# Patient Record
Sex: Male | Born: 1950 | Race: White | Hispanic: No | Marital: Married | Smoking: Former smoker
Health system: Southern US, Community
[De-identification: ages and names within clinical notes are randomized; demographics above are authoritative.]

## PROBLEM LIST (undated history)

## (undated) DIAGNOSIS — I2510 Atherosclerotic heart disease of native coronary artery without angina pectoris: Secondary | ICD-10-CM

## (undated) DIAGNOSIS — J84112 Idiopathic pulmonary fibrosis: Secondary | ICD-10-CM

## (undated) DIAGNOSIS — J849 Interstitial pulmonary disease, unspecified: Secondary | ICD-10-CM

## (undated) DIAGNOSIS — N400 Enlarged prostate without lower urinary tract symptoms: Secondary | ICD-10-CM

## (undated) HISTORY — DX: Benign prostatic hyperplasia without lower urinary tract symptoms: N40.0

## (undated) HISTORY — DX: Interstitial pulmonary disease, unspecified: J84.9

## (undated) HISTORY — DX: Atherosclerotic heart disease of native coronary artery without angina pectoris: I25.10

---

## 1976-09-23 HISTORY — PX: KNEE SURGERY: SHX244

## 1990-09-23 HISTORY — PX: ANGIOPLASTY: SHX39

## 2001-01-21 HISTORY — PX: CORONARY ANGIOPLASTY WITH STENT PLACEMENT: SHX49

## 2001-01-21 HISTORY — PX: ESOPHAGOGASTRODUODENOSCOPY: SHX1529

## 2001-01-26 ENCOUNTER — Inpatient Hospital Stay (HOSPITAL_COMMUNITY): Admission: AD | Admit: 2001-01-26 | Discharge: 2001-01-30 | Payer: Self-pay | Admitting: Cardiology

## 2001-01-28 ENCOUNTER — Encounter: Payer: Self-pay | Admitting: Cardiology

## 2004-07-02 LAB — FECAL OCCULT BLOOD, GUAIAC: Fecal Occult Blood: NEGATIVE

## 2004-11-08 ENCOUNTER — Ambulatory Visit: Payer: Self-pay | Admitting: Internal Medicine

## 2005-01-07 ENCOUNTER — Ambulatory Visit: Payer: Self-pay | Admitting: Cardiology

## 2005-01-16 ENCOUNTER — Ambulatory Visit: Payer: Self-pay

## 2005-05-08 ENCOUNTER — Ambulatory Visit: Payer: Self-pay | Admitting: Internal Medicine

## 2005-07-04 ENCOUNTER — Ambulatory Visit: Payer: Self-pay | Admitting: Internal Medicine

## 2005-07-10 ENCOUNTER — Ambulatory Visit: Payer: Self-pay | Admitting: Internal Medicine

## 2005-08-21 ENCOUNTER — Ambulatory Visit: Payer: Self-pay | Admitting: Internal Medicine

## 2005-10-01 ENCOUNTER — Ambulatory Visit: Payer: Self-pay | Admitting: Internal Medicine

## 2005-11-08 ENCOUNTER — Ambulatory Visit: Payer: Self-pay | Admitting: Internal Medicine

## 2005-12-04 ENCOUNTER — Ambulatory Visit: Payer: Self-pay | Admitting: Internal Medicine

## 2006-01-08 ENCOUNTER — Ambulatory Visit: Payer: Self-pay | Admitting: Cardiology

## 2006-01-30 ENCOUNTER — Ambulatory Visit: Payer: Self-pay | Admitting: Internal Medicine

## 2006-05-03 ENCOUNTER — Ambulatory Visit: Payer: Self-pay | Admitting: Internal Medicine

## 2006-05-09 ENCOUNTER — Ambulatory Visit: Payer: Self-pay | Admitting: Internal Medicine

## 2006-05-28 ENCOUNTER — Ambulatory Visit: Payer: Self-pay | Admitting: Gastroenterology

## 2006-09-16 ENCOUNTER — Other Ambulatory Visit: Payer: Self-pay

## 2006-09-16 ENCOUNTER — Emergency Department: Payer: Self-pay | Admitting: Emergency Medicine

## 2006-11-17 ENCOUNTER — Ambulatory Visit: Payer: Self-pay | Admitting: Internal Medicine

## 2006-11-17 LAB — CONVERTED CEMR LAB
ALT: 18 units/L (ref 0–40)
Cholesterol: 136 mg/dL (ref 0–200)
HDL: 42 mg/dL (ref >39.0)
LDL Cholesterol: 81 mg/dL (ref 0–99)
Total CHOL/HDL Ratio: 3.2
Triglycerides: 67 mg/dL (ref 0–149)
VLDL: 13 mg/dL (ref 0–40)

## 2006-12-23 ENCOUNTER — Ambulatory Visit: Payer: Self-pay | Admitting: Cardiology

## 2006-12-31 ENCOUNTER — Ambulatory Visit: Payer: Self-pay

## 2007-05-07 DIAGNOSIS — I2510 Atherosclerotic heart disease of native coronary artery without angina pectoris: Secondary | ICD-10-CM

## 2007-05-07 DIAGNOSIS — E785 Hyperlipidemia, unspecified: Secondary | ICD-10-CM

## 2007-05-18 ENCOUNTER — Ambulatory Visit: Payer: Self-pay | Admitting: Internal Medicine

## 2007-05-19 LAB — CONVERTED CEMR LAB
ALT: 18 units/L (ref 0–53)
Albumin: 3.8 g/dL (ref 3.5–5.2)
BUN: 10 mg/dL (ref 6–23)
CO2: 32 meq/L (ref 19–32)
Calcium: 9.1 mg/dL (ref 8.4–10.5)
Chloride: 107 meq/L (ref 96–112)
Cholesterol: 146 mg/dL (ref 0–200)
Creatinine, Ser: 0.9 mg/dL (ref 0.4–1.5)
GFR calc Af Amer: 112 mL/min
GFR calc non Af Amer: 93 mL/min
Glucose, Bld: 98 mg/dL (ref 70–99)
HDL: 38.3 mg/dL — ABNORMAL LOW (ref >39.0)
LDL Cholesterol: 91 mg/dL (ref 0–99)
Phosphorus: 2.5 mg/dL (ref 2.3–4.6)
Potassium: 4.8 meq/L (ref 3.5–5.1)
Sodium: 142 meq/L (ref 135–145)
Total CHOL/HDL Ratio: 3.8
Triglycerides: 86 mg/dL (ref 0–149)
VLDL: 17 mg/dL (ref 0–40)

## 2007-10-07 ENCOUNTER — Encounter: Payer: Self-pay | Admitting: Internal Medicine

## 2007-11-06 ENCOUNTER — Ambulatory Visit: Payer: Self-pay | Admitting: Internal Medicine

## 2007-11-09 LAB — CONVERTED CEMR LAB
ALT: 21 units/L (ref 0–53)
AST: 22 units/L (ref 0–37)
Albumin: 4.2 g/dL (ref 3.5–5.2)
Alkaline Phosphatase: 32 units/L — ABNORMAL LOW (ref 39–117)
BUN: 10 mg/dL (ref 6–23)
Basophils Absolute: 0 10*3/uL (ref 0.0–0.1)
Basophils Relative: 0.2 % (ref 0.0–1.0)
Bilirubin, Direct: 0.1 mg/dL (ref 0.0–0.3)
CO2: 31 meq/L (ref 19–32)
Calcium: 9.7 mg/dL (ref 8.4–10.5)
Chloride: 108 meq/L (ref 96–112)
Cholesterol: 147 mg/dL (ref 0–200)
Creatinine, Ser: 0.9 mg/dL (ref 0.4–1.5)
Eosinophils Absolute: 0.2 10*3/uL (ref 0.0–0.6)
Eosinophils Relative: 2.8 % (ref 0.0–5.0)
GFR calc Af Amer: 112 mL/min
GFR calc non Af Amer: 93 mL/min
Glucose, Bld: 94 mg/dL (ref 70–99)
HCT: 44 % (ref 39.0–52.0)
HDL: 40.3 mg/dL (ref >39.0)
Hemoglobin: 14.7 g/dL (ref 13.0–17.0)
LDL Cholesterol: 85 mg/dL (ref 0–99)
Lymphocytes Relative: 16.2 % (ref 12.0–46.0)
MCHC: 33.3 g/dL (ref 30.0–36.0)
MCV: 92.5 fL (ref 78.0–100.0)
Monocytes Absolute: 0.6 10*3/uL (ref 0.2–0.7)
Monocytes Relative: 8.8 % (ref 3.0–11.0)
Neutro Abs: 4.6 10*3/uL (ref 1.4–7.7)
Neutrophils Relative %: 72 % (ref 43.0–77.0)
Phosphorus: 3.3 mg/dL (ref 2.3–4.6)
Platelets: 182 10*3/uL (ref 150–400)
Potassium: 4.8 meq/L (ref 3.5–5.1)
RBC: 4.76 M/uL (ref 4.22–5.81)
RDW: 11.9 % (ref 11.5–14.6)
Sodium: 142 meq/L (ref 135–145)
TSH: 0.75 microintl units/mL (ref 0.35–5.50)
Total Bilirubin: 0.9 mg/dL (ref 0.3–1.2)
Total CHOL/HDL Ratio: 3.6
Total Protein: 7.7 g/dL (ref 6.0–8.3)
Triglycerides: 109 mg/dL (ref 0–149)
VLDL: 22 mg/dL (ref 0–40)
WBC: 6.4 10*3/uL (ref 4.5–10.5)

## 2007-11-27 ENCOUNTER — Ambulatory Visit: Payer: Self-pay | Admitting: Internal Medicine

## 2007-12-01 ENCOUNTER — Encounter (INDEPENDENT_AMBULATORY_CARE_PROVIDER_SITE_OTHER): Payer: Self-pay | Admitting: *Deleted

## 2007-12-23 ENCOUNTER — Ambulatory Visit: Payer: Self-pay | Admitting: Cardiology

## 2007-12-30 ENCOUNTER — Ambulatory Visit: Payer: Self-pay | Admitting: Internal Medicine

## 2008-01-04 ENCOUNTER — Encounter: Payer: Self-pay | Admitting: Internal Medicine

## 2008-01-13 ENCOUNTER — Ambulatory Visit: Payer: Self-pay | Admitting: Pulmonary Disease

## 2008-01-13 DIAGNOSIS — J84112 Idiopathic pulmonary fibrosis: Secondary | ICD-10-CM | POA: Insufficient documentation

## 2008-01-14 ENCOUNTER — Ambulatory Visit: Payer: Self-pay | Admitting: Internal Medicine

## 2008-01-22 ENCOUNTER — Ambulatory Visit: Payer: Self-pay | Admitting: Pulmonary Disease

## 2008-05-10 ENCOUNTER — Ambulatory Visit: Payer: Self-pay | Admitting: Internal Medicine

## 2008-05-10 DIAGNOSIS — J310 Chronic rhinitis: Secondary | ICD-10-CM

## 2008-05-11 LAB — CONVERTED CEMR LAB
ALT: 18 units/L (ref 0–53)
AST: 20 units/L (ref 0–37)
Albumin: 4 g/dL (ref 3.5–5.2)
Alkaline Phosphatase: 28 units/L — ABNORMAL LOW (ref 39–117)
BUN: 15 mg/dL (ref 6–23)
Basophils Absolute: 0 10*3/uL (ref 0.0–0.1)
Basophils Relative: 0.1 % (ref 0.0–3.0)
Bilirubin, Direct: 0.2 mg/dL (ref 0.0–0.3)
CO2: 29 meq/L (ref 19–32)
Calcium: 9.2 mg/dL (ref 8.4–10.5)
Chloride: 105 meq/L (ref 96–112)
Cholesterol: 153 mg/dL (ref 0–200)
Creatinine, Ser: 1.1 mg/dL (ref 0.4–1.5)
Eosinophils Absolute: 0.2 10*3/uL (ref 0.0–0.7)
Eosinophils Relative: 3 % (ref 0.0–5.0)
GFR calc Af Amer: 89 mL/min
GFR calc non Af Amer: 73 mL/min
Glucose, Bld: 98 mg/dL (ref 70–99)
HCT: 42.2 % (ref 39.0–52.0)
HDL: 41.9 mg/dL (ref >39.0)
Hemoglobin: 14.9 g/dL (ref 13.0–17.0)
LDL Cholesterol: 91 mg/dL (ref 0–99)
Lymphocytes Relative: 23.5 % (ref 12.0–46.0)
MCHC: 35.2 g/dL (ref 30.0–36.0)
MCV: 93.2 fL (ref 78.0–100.0)
Monocytes Absolute: 0.6 10*3/uL (ref 0.1–1.0)
Monocytes Relative: 8.9 % (ref 3.0–12.0)
Neutro Abs: 4.2 10*3/uL (ref 1.4–7.7)
Neutrophils Relative %: 64.5 % (ref 43.0–77.0)
Phosphorus: 3.3 mg/dL (ref 2.3–4.6)
Platelets: 170 10*3/uL (ref 150–400)
Potassium: 4.8 meq/L (ref 3.5–5.1)
RBC: 4.53 M/uL (ref 4.22–5.81)
RDW: 12.2 % (ref 11.5–14.6)
Sodium: 139 meq/L (ref 135–145)
TSH: 0.82 microintl units/mL (ref 0.35–5.50)
Total Bilirubin: 1.1 mg/dL (ref 0.3–1.2)
Total CHOL/HDL Ratio: 3.7
Total Protein: 7.2 g/dL (ref 6.0–8.3)
Triglycerides: 100 mg/dL (ref 0–149)
VLDL: 20 mg/dL (ref 0–40)
WBC: 6.6 10*3/uL (ref 4.5–10.5)

## 2008-07-20 ENCOUNTER — Ambulatory Visit: Payer: Self-pay | Admitting: Pulmonary Disease

## 2008-10-03 ENCOUNTER — Encounter: Payer: Self-pay | Admitting: Internal Medicine

## 2008-11-15 ENCOUNTER — Ambulatory Visit: Payer: Self-pay | Admitting: Internal Medicine

## 2008-11-16 LAB — CONVERTED CEMR LAB
ALT: 18 units/L (ref 0–53)
AST: 24 units/L (ref 0–37)
Albumin: 3.9 g/dL (ref 3.5–5.2)
Alkaline Phosphatase: 32 units/L — ABNORMAL LOW (ref 39–117)
Basophils Absolute: 0 10*3/uL (ref 0.0–0.1)
Basophils Relative: 0.1 % (ref 0.0–3.0)
CO2: 30 meq/L (ref 19–32)
Calcium: 9.5 mg/dL (ref 8.4–10.5)
Cholesterol: 149 mg/dL (ref 0–200)
Creatinine, Ser: 1 mg/dL (ref 0.4–1.5)
Glucose, Bld: 99 mg/dL (ref 70–99)
Hemoglobin: 14.7 g/dL (ref 13.0–17.0)
LDL Cholesterol: 90 mg/dL (ref 0–99)
Lymphocytes Relative: 17.3 % (ref 12.0–46.0)
MCHC: 34.2 g/dL (ref 30.0–36.0)
Monocytes Relative: 8.3 % (ref 3.0–12.0)
Neutro Abs: 4.5 10*3/uL (ref 1.4–7.7)
Neutrophils Relative %: 71.6 % (ref 43.0–77.0)
Phosphorus: 2.8 mg/dL (ref 2.3–4.6)
RBC: 4.61 M/uL (ref 4.22–5.81)
Total Bilirubin: 1 mg/dL (ref 0.3–1.2)
Total CHOL/HDL Ratio: 3.9
VLDL: 21 mg/dL (ref 0–40)

## 2008-11-22 ENCOUNTER — Ambulatory Visit: Payer: Self-pay | Admitting: Internal Medicine

## 2008-11-22 DIAGNOSIS — K921 Melena: Secondary | ICD-10-CM

## 2008-12-21 ENCOUNTER — Ambulatory Visit: Payer: Self-pay | Admitting: Gastroenterology

## 2009-01-17 ENCOUNTER — Encounter: Payer: Self-pay | Admitting: Pulmonary Disease

## 2009-01-18 ENCOUNTER — Ambulatory Visit: Payer: Self-pay | Admitting: Pulmonary Disease

## 2009-01-21 HISTORY — PX: URETERAL STENT PLACEMENT: SHX822

## 2009-02-10 ENCOUNTER — Encounter: Payer: Self-pay | Admitting: Internal Medicine

## 2009-02-10 LAB — HM COLONOSCOPY

## 2009-02-21 ENCOUNTER — Ambulatory Visit: Payer: Self-pay | Admitting: Cardiology

## 2009-03-10 ENCOUNTER — Ambulatory Visit: Payer: Self-pay | Admitting: Urology

## 2009-03-10 ENCOUNTER — Encounter: Payer: Self-pay | Admitting: Internal Medicine

## 2009-03-10 ENCOUNTER — Emergency Department: Payer: Self-pay | Admitting: Emergency Medicine

## 2009-03-13 ENCOUNTER — Ambulatory Visit: Payer: Self-pay | Admitting: Urology

## 2009-03-16 ENCOUNTER — Ambulatory Visit: Payer: Self-pay | Admitting: Urology

## 2009-03-23 ENCOUNTER — Ambulatory Visit: Payer: Self-pay | Admitting: Urology

## 2009-03-23 ENCOUNTER — Encounter: Payer: Self-pay | Admitting: Internal Medicine

## 2009-04-03 ENCOUNTER — Ambulatory Visit: Payer: Self-pay | Admitting: Urology

## 2009-04-04 ENCOUNTER — Telehealth (INDEPENDENT_AMBULATORY_CARE_PROVIDER_SITE_OTHER): Payer: Self-pay | Admitting: *Deleted

## 2009-04-05 ENCOUNTER — Telehealth: Payer: Self-pay | Admitting: Cardiology

## 2009-04-06 ENCOUNTER — Telehealth: Payer: Self-pay | Admitting: Cardiology

## 2009-06-07 ENCOUNTER — Ambulatory Visit: Payer: Self-pay | Admitting: Internal Medicine

## 2009-06-08 LAB — CONVERTED CEMR LAB
Albumin: 4 g/dL (ref 3.5–5.2)
Basophils Relative: 0.2 % (ref 0.0–3.0)
CO2: 31 meq/L (ref 19–32)
Chloride: 104 meq/L (ref 96–112)
Eosinophils Absolute: 0.1 10*3/uL (ref 0.0–0.7)
HCT: 43.8 % (ref 39.0–52.0)
HDL: 45.2 mg/dL (ref >39.00)
Hemoglobin: 14.7 g/dL (ref 13.0–17.0)
LDL Cholesterol: 97 mg/dL (ref 0–99)
Lymphocytes Relative: 17.3 % (ref 12.0–46.0)
Lymphs Abs: 1 10*3/uL (ref 0.7–4.0)
MCHC: 33.6 g/dL (ref 30.0–36.0)
MCV: 93.7 fL (ref 78.0–100.0)
Neutro Abs: 4.2 10*3/uL (ref 1.4–7.7)
Potassium: 5.1 meq/L (ref 3.5–5.1)
RBC: 4.68 M/uL (ref 4.22–5.81)
Sodium: 140 meq/L (ref 135–145)
Total Bilirubin: 1.2 mg/dL (ref 0.3–1.2)
Total CHOL/HDL Ratio: 3
Triglycerides: 77 mg/dL (ref 0.0–149.0)

## 2009-07-03 ENCOUNTER — Ambulatory Visit: Payer: Self-pay | Admitting: Urology

## 2009-07-03 ENCOUNTER — Encounter: Payer: Self-pay | Admitting: Internal Medicine

## 2009-07-10 ENCOUNTER — Ambulatory Visit: Payer: Self-pay | Admitting: Urology

## 2009-07-11 ENCOUNTER — Telehealth: Payer: Self-pay | Admitting: Internal Medicine

## 2009-08-23 ENCOUNTER — Encounter: Payer: Self-pay | Admitting: Internal Medicine

## 2009-11-21 ENCOUNTER — Ambulatory Visit: Payer: Self-pay | Admitting: Internal Medicine

## 2009-11-21 DIAGNOSIS — L570 Actinic keratosis: Secondary | ICD-10-CM | POA: Insufficient documentation

## 2009-11-22 ENCOUNTER — Encounter: Payer: Self-pay | Admitting: Internal Medicine

## 2009-11-22 LAB — CONVERTED CEMR LAB
AST: 21 units/L (ref 0–37)
Basophils Relative: 0.2 % (ref 0.0–3.0)
CO2: 31 meq/L (ref 19–32)
Calcium: 9.5 mg/dL (ref 8.4–10.5)
Eosinophils Absolute: 0.2 10*3/uL (ref 0.0–0.7)
Glucose, Bld: 98 mg/dL (ref 70–99)
HDL: 51.9 mg/dL (ref >39.00)
Hemoglobin: 14.4 g/dL (ref 13.0–17.0)
Lymphs Abs: 1.1 10*3/uL (ref 0.7–4.0)
MCHC: 32.7 g/dL (ref 30.0–36.0)
MCV: 95.9 fL (ref 78.0–100.0)
Monocytes Absolute: 0.5 10*3/uL (ref 0.1–1.0)
Neutro Abs: 4 10*3/uL (ref 1.4–7.7)
Potassium: 4.9 meq/L (ref 3.5–5.1)
RBC: 4.58 M/uL (ref 4.22–5.81)
Sodium: 140 meq/L (ref 135–145)
Total Bilirubin: 0.9 mg/dL (ref 0.3–1.2)
Total CHOL/HDL Ratio: 3

## 2010-01-15 ENCOUNTER — Ambulatory Visit: Payer: Self-pay | Admitting: Pulmonary Disease

## 2010-01-17 ENCOUNTER — Encounter: Payer: Self-pay | Admitting: Pulmonary Disease

## 2010-01-17 ENCOUNTER — Ambulatory Visit (HOSPITAL_COMMUNITY): Admission: RE | Admit: 2010-01-17 | Discharge: 2010-01-17 | Payer: Self-pay | Admitting: Pulmonary Disease

## 2010-01-24 ENCOUNTER — Encounter: Payer: Self-pay | Admitting: Internal Medicine

## 2010-01-24 ENCOUNTER — Ambulatory Visit: Payer: Self-pay | Admitting: Urology

## 2010-02-05 ENCOUNTER — Telehealth: Payer: Self-pay | Admitting: Pulmonary Disease

## 2010-02-09 ENCOUNTER — Ambulatory Visit: Payer: Self-pay | Admitting: Internal Medicine

## 2010-02-22 ENCOUNTER — Ambulatory Visit: Payer: Self-pay | Admitting: Cardiology

## 2010-02-27 ENCOUNTER — Telehealth (INDEPENDENT_AMBULATORY_CARE_PROVIDER_SITE_OTHER): Payer: Self-pay | Admitting: *Deleted

## 2010-02-28 ENCOUNTER — Ambulatory Visit: Payer: Self-pay | Admitting: Cardiovascular Disease

## 2010-02-28 ENCOUNTER — Encounter: Payer: Self-pay | Admitting: Cardiovascular Disease

## 2010-02-28 ENCOUNTER — Ambulatory Visit: Payer: Self-pay

## 2010-02-28 ENCOUNTER — Encounter (HOSPITAL_COMMUNITY): Admission: RE | Admit: 2010-02-28 | Discharge: 2010-04-24 | Payer: Self-pay | Admitting: Cardiology

## 2010-06-06 ENCOUNTER — Ambulatory Visit: Payer: Self-pay | Admitting: Pulmonary Disease

## 2010-08-03 ENCOUNTER — Ambulatory Visit: Payer: Self-pay | Admitting: Pulmonary Disease

## 2010-08-08 ENCOUNTER — Ambulatory Visit: Payer: Self-pay | Admitting: Cardiovascular Disease

## 2010-08-08 DIAGNOSIS — R0609 Other forms of dyspnea: Secondary | ICD-10-CM

## 2010-08-08 DIAGNOSIS — R0989 Other specified symptoms and signs involving the circulatory and respiratory systems: Secondary | ICD-10-CM

## 2010-08-09 ENCOUNTER — Telehealth: Payer: Self-pay | Admitting: Pulmonary Disease

## 2010-10-23 NOTE — Assessment & Plan Note (Signed)
Summary: rov for isld   Copy to:  [name], MD Primary Provider/Referring Provider:  [name], MD  CC:  Pt is here for a 1 yr f/u appt.  Pt c/o increased sob with exertion.  Pt also c/o nonproductive cough.  Pt denied tightness in chest or sore throat.  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  We have been following this, with no pft or radiographic changes, and the pt has been essentially asymptomatic.  He comes in today where he is doing well, with no significant change in his exertional tolerance.  He is having more allergy symptoms with cough from postnasal drip, but has no exercise limitation.  I questioned him carefully about worsening doe, and he really does not have this.  He denies chest congestion or purulence.  Current Medications (verified): 1)  Aspirin 81 Mg  Tbec (Aspirin) .... Take One By Mouth Once A Day 2)  Crestor 40 Mg  Tabs (Rosuvastatin Calcium) .... Take One By Mouth Once A Day 3)  Metoprolol Succinate 25 Mg  Tb24 (Metoprolol Succinate) .... Take One By Mouth Once A Day 4)  Flonase 50 Mcg/act  Susp (Fluticasone Propionate) .... 2 Sprays Each  Nostril Daily Two Times A Day As Needed 5)  Nitroglycerin 0.4 Mg Subl (Nitroglycerin) .... One Tablet Under Tongue Every 5 Minutes As Needed For Chest Pain---May Repeat Times Three 6)  Rapaflo 8 Mg Caps (Silodosin) .... Take 1 By Mouth Once Daily 7)  Diovan 160 Mg Tabs (Valsartan) .... 1/2 Tab By Mouth Daily  Allergies (verified): 1)  Lipitor (Atorvastatin Calcium) 2)  Doxycycline  Review of Systems      See HPI  Vital Signs:  Patient profile:   60 year old male Height:      72 inches Weight:      191.13 pounds BMI:     26.02 O2 Sat:      97 % on Room air Temp:     97.7 degrees F oral Pulse rate:   67 / minute BP sitting:   126 / 70  (left arm)  Vitals Entered By: Arman Filter LPN (January 15, 2010 8:50 AM)  O2 Flow:  Room air CC: Pt is here for a  1 yr f/u appt.  Pt c/o increased sob with exertion.  Pt also c/o nonproductive cough.  Pt denied tightness in chest or sore throat.   Comments Medications reviewed with patient Arman Filter LPN  January 15, 2010 8:50 AM    Physical Exam  General:  thin male in nad Lungs:  crackles 1/3 up r>l, no wheezing or rhonchi Heart:  rrr, no mrg Extremities:  no edema noted, no cyanosis Neurologic:  alert and oriented, moves all 4.   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  HIs cxr is stable from last year as well.  He needs full pfts to compare to last year, and if stable, would continue to monitor him for change.  If pfts show worsening, would repeat ct chest and consider vats biopsy.  I have discussed this with pt at great length, and answered all of his questions.  Medications Added to Medication List This Visit: 1)  Flonase 50 Mcg/act Susp (Fluticasone propionate) .... 2 sprays each  nostril daily two times a day as  needed  Other Orders: T-2 View CXR (71020TC) Est. Patient Level III (16109) Pulmonary Referral (Pulmonary)  Patient Instructions: 1)  will schedule for pfts to evaluate your lung function and compare to last year. 2)  please call if changes in symptoms 3)  followup with me in one year.  Appended Document: rov for isld received PFTS from [facility] and put in your very important look at folder  Appended Document: rov for isld will review and discuss with pt.  Appended Document: rov for isld megan, let pt know that his pfts do show some decline in his lung function.  I would like to do ct chest to compare to 2009, and will discuss with him in detail once all of this is done.  thanks. will send an order to pcc when you let me know you have spoken to him.  Appended Document: rov for isld [id number]  Appended Document: rov for  isld Pt aware of pft results.  Ok to send order to pcc for ct chest scheduling.  Message sent to Dr [name].

## 2010-10-23 NOTE — Assessment & Plan Note (Signed)
Summary: Cardiology Nuclear Study  Nuclear Med Background Indications for Stress Test: Evaluation for Ischemia   History: Heart Catheterization, Myocardial Infarction, Myocardial Perfusion Study, Stents  History Comments: '02 IPWMI > Heart Cath: EF=55%, totalled RCA, 75% CFX > Stent: RCA 4/08 MPS: EF=51%, old infero-lateral scar, (-) ischemia  Symptoms: DOE, Palpitations, SOB    Nuclear Pre-Procedure Cardiac Risk Factors: Family History - CAD, History of Smoking, Hypertension, Lipids Caffeine/Decaff Intake: None NPO After: 9:00 PM Lungs: clear IV 0.9% NS with Angio Cath: 20g     IV Site: (R) AC IV Started by: Irean Hong RN Chest Size (in) 41     Height (in): 72 Weight (lb): 188 BMI: 25.59 Tech Comments: Metoprolol 24 hrs.ago  Nuclear Med Study 1 or 2 day study:  1 day     Stress Test Type:  Stress Reading MD:  [name], MD     Referring MD:  T.Wall Resting Radionuclide:  Technetium 5m Tetrofosmin     Resting Radionuclide Dose:  11 mCi  Stress Radionuclide:  Technetium 8m Tetrofosmin     Stress Radionuclide Dose:  33 mCi   Stress Protocol Exercise Time (min):  10:00 min     Max HR:  171 bpm     Predicted Max HR:  161 bpm  Max Systolic BP: 159 mm Hg     Percent Max HR:  106.21 %     METS: 11.70 Rate Pressure Product:  16109    Stress Test Technologist:  Milana Na EMT-P     Nuclear Technologist:  Domenic Polite CNMT  Rest Procedure  Myocardial perfusion imaging was performed at rest 45 minutes following the intravenous administration of Myoview Technetium 72m Tetrofosmin.  Stress Procedure  The patient exercised for 10:00. The patient stopped due to fatigue and denied any chest pain.  There were no significant ST-T wave changes.  Myoview was injected at peak exercise and myocardial perfusion imaging was performed after a brief delay.  QPS Raw Data Images:  Normal; no motion artifact; normal heart/lung ratio. Stress Images:  ILMI Rest Images:   ILMI Subtraction (SDS):  SDS 5 Transient Ischemic Dilatation:  1.01  (Normal <1.22)  Lung/Heart Ratio:  .40  (Normal <0.45)  Quantitative Gated Spect Images QGS EDV:  99 ml QGS ESV:  41 ml QGS EF:  59 % QGS cine images:  normal  Findings Low risk nuclear study Evidence for inferior ischemia  Evidence for inferior infarct     Overall Impression  Exercise Capacity: Good exercise capacity. BP Response: Normal blood pressure response. 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 no change in treatment.  Appended Document: Cardiology Nuclear Study LMTCB./CY  Appended Document: Cardiology Nuclear Study PT AWARE./CY

## 2010-10-23 NOTE — Progress Notes (Signed)
Summary: return call  Phone Note Call from Patient Call back at [phone]   Caller: Patient Call For: clance Reason for Call: Talk to Nurse Summary of Call: pt returning call to [facility] Initial call taken by: Eugene Gavia,  Feb 05, 2010 8:17 AM  Follow-up for Phone Call        Pt aware of pft results.  Ok to send order to pcc for ct chest scheduling. Abigail Miyamoto RN  Feb 05, 2010 8:53 AM   Additional Follow-up for Phone Call Additional follow up Details #1::        done. Additional Follow-up by: Barbaraann Share MD,  Feb 05, 2010 5:15 PM

## 2010-10-23 NOTE — Procedures (Signed)
Summary: Colonoscopy by Dr.Steven [facility]  Colonoscopy by Dr.Steven [facility]   Imported By: Beau Fanny 11/22/2009 10:52:57  _____________________________________________________________________  External Attachment:    Type:   Image     Comment:   External Document

## 2010-10-23 NOTE — Assessment & Plan Note (Signed)
Summary: rov for isld   Visit Type:  Follow-up Primary Torah Pinnock/Referring Mitsue Peery:  [name], MD  CC:  ILD  3 month follow-up...still SOB with exertion but no worse...PND.  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  We have been following chest ct's and pfts, with no significant change noted.  He also has not had a change clinically either.  He comes in today where he is having some cough, but believes this is due to allergies with postnasal drip.  He has some doe with primarily heavy activities, but has maintained his usual baseline.  He is exercising regularly, and has not seen a decline in function.  Current Medications (verified): 1)  Aspirin 81 Mg  Tbec (Aspirin) .... Take One By Mouth Once A Day 2)  Crestor 40 Mg  Tabs (Rosuvastatin Calcium) .... Take One By Mouth Once A Day 3)  Metoprolol Succinate 25 Mg  Tb24 (Metoprolol Succinate) .... Take One By Mouth Once A Day 4)  Flonase 50 Mcg/act  Susp (Fluticasone Propionate) .... 2 Sprays Each  Nostril Daily Two Times A Day As Needed 5)  Nitroglycerin 0.4 Mg Subl (Nitroglycerin) .... One Tablet Under Tongue Every 5 Minutes As Needed For Chest Pain---May Repeat Times Three 6)  Diovan 160 Mg Tabs (Valsartan) .... 1/2 Tab By Mouth Daily  Allergies (verified): 1)  Lipitor (Atorvastatin Calcium) 2)  Doxycycline  Review of Systems       The patient complains of shortness of breath with activity and non-productive cough.  The patient denies shortness of breath at rest, productive cough, coughing up blood, chest pain, irregular heartbeats, acid heartburn, indigestion, loss of appetite, weight change, abdominal pain, difficulty swallowing, sore throat, tooth/dental problems, headaches, nasal congestion/difficulty breathing through nose, sneezing, itching, ear ache, anxiety, depression, hand/feet swelling, joint stiffness or pain, rash, change in color of mucus, and fever.    Vital  Signs:  Patient profile:   60 year old male Height:      72 inches (182.88 cm) Weight:      189 pounds (85.91 kg) BMI:     25.73 O2 Sat:      97 % on Room air Temp:     97.9 degrees F (36.61 degrees C) oral Pulse rate:   64 / minute BP sitting:   104 / 62  (left arm) Cuff size:   regular  Vitals Entered By: Michel Bickers CMA (June 06, 2010 9:24 AM)  O2 Sat at Rest %:  97 O2 Flow:  Room air CC: ILD  3 month follow-up...still SOB with exertion but no worse...PND Comments Medications reviewed with patient Daytime phone verified. Michel Bickers CMA  June 06, 2010 9:24 AM   Physical Exam  General:  wd male in nad Nose:  no obvious purulence or discharge noted Lungs:  basilar crackles, no wheezing Heart:  rrr, no mrg Extremities:  no edema or cyanosis  Neurologic:  alert and oriented, moves all 4.   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  He has not seen a decrease in his exercise tolerance, and really has no pulmonary symptoms.  He is describing a mild cough that he and I both feel is related to postnasal drip.  His pfts and chest ct have been stable.  He is due for f/u testing next visit in  6mos.  He understands that if he has any subjective or objective worsening, he will need a lung biopsy to establish a firm diagnosis, and to consider treatment options.  Other Orders: Est. Patient Level III (16109) Pulmonary Referral (Pulmonary)  Patient Instructions: 1)  will see you back in 6mos with pfts and cxr same day 2)  continue on flonase, add chlorpheniramine 8mg  each night for a few weeks to see if helps postnasal drip and cough.

## 2010-10-23 NOTE — Assessment & Plan Note (Signed)
Summary: acute sick visit for worsening sob.   Copy to:  Bradley Abide, MD Primary Provider/Referring Provider:  [name], MD  CC:  pt states his sob has gotten worse. Pt states he feels like sometimes he has to gasp for air when doing activity. Pt c/o cough with occas clear phlem. pt quit smoking 1992. Bradley May  History of Present Illness: the pt comes in today for an acute sick visit.  He has known ISLD that is in a pattern most c/w UIP.  We have been following him with serial chest radiographs and pfts, and he has been fairly stable.  He comes in today where he feels that his breathing has gotten worse most recently.  He has seen more sob with moderate levels of activity compared to the past.  He has a mild cough with nonpurulent mucus.  He denies any chest pain or LE edema.  Current Medications (verified): 1)  Aspirin 81 Mg  Tbec (Aspirin) .... Take One By Mouth Once A Day 2)  Crestor 40 Mg  Tabs (Rosuvastatin Calcium) .... Take One By Mouth Once A Day 3)  Metoprolol Succinate 25 Mg  Tb24 (Metoprolol Succinate) .... Take One By Mouth Once A Day 4)  Flonase 50 Mcg/act  Susp (Fluticasone Propionate) .... 2 Sprays Each  Nostril Daily Two Times A Day As Needed 5)  Nitroglycerin 0.4 Mg Subl (Nitroglycerin) .... One Tablet Under Tongue Every 5 Minutes As Needed For Chest Pain---May Repeat Times Three 6)  Diovan 160 Mg Tabs (Valsartan) .... 1/2 Tab By Mouth Daily  Allergies (verified): 1)  Lipitor (Atorvastatin Calcium) 2)  Doxycycline  Past History:  Past medical, surgical, family and social histories (including risk factors) reviewed, and no changes noted (except as noted below).  Past Medical History: Reviewed history from 06/07/2009 and no changes required. Coronary artery disease--------------------------Dr Wall    Benign prostatic hypertrophy-----------------------Dr Cope  Interstitial lung disease ----------------------------Dr [name] Allergic rhinitis  Past Surgical  History: Reviewed history from 06/07/2009 and no changes required. Right knee surgery 1978 MI, angioplasty 1992 MI -Stent RCA 05/02 EGD- bleeding 05/02 Stress cardio, negative EF 62%, normal EF 06/02 Cardiolite -  scar, EF- 56% 04/06 Myoview stress- old inferolat scar, no ischemia, EF 51% 04/08 Ureteral stent for stone--5/10   Dr [name]  Family History: Reviewed history from 05/07/2007 and no changes required. Dad with CAD but died of prostate cancer Dad also had DM Mom and Dad had HTN  Social History: Reviewed history from 05/07/2007 and no changes required. Marital Status: Married Children: One daughter Occupation: Risk analyst Former Smoker--quit 1992 Alcohol use-no  Review of Systems       The patient complains of shortness of breath with activity, productive cough, non-productive cough, and nasal congestion/difficulty breathing through nose.  The patient denies shortness of breath at rest, coughing up blood, chest pain, irregular heartbeats, acid heartburn, indigestion, loss of appetite, weight change, abdominal pain, difficulty swallowing, sore throat, tooth/dental problems, headaches, sneezing, itching, ear ache, anxiety, depression, hand/feet swelling, joint stiffness or pain, rash, change in color of mucus, and fever.    Vital Signs:  Patient profile:   60 year old male Height:      72 inches Weight:      193 pounds BMI:     26.27 O2 Sat:      99 % on Room air Temp:     98.1 degrees F oral Pulse rate:   66 / minute BP sitting:   138 / 88  (left  arm) Cuff size:   regular  Vitals Entered By: Carver Fila (August 03, 2010 12:01 PM)  O2 Flow:  Room air CC: pt states his sob has gotten worse. Pt states he feels like sometimes he has to gasp for air when doing activity. Pt c/o cough with occas clear phlem. pt quit smoking 1992.  Comments meds and allergies updated Phone number updated Carver Fila  August 03, 2010 12:02 PM    Physical Exam  General:  thin  male in nad Lungs:  mild bibasilar crackles, no wheezing or rhonchi Heart:  rrr Extremities:  no edema or cyanosis  Neurologic:  alert and oriented, moves all 4.   Impression & Recommendations:  Problem # 1:  DYSPNEA ON EXERTION (ICD-786.09) the pt has noted worsening doe that is new for him.  He has known ISLD, but also has a h/o cardiac disease.  He has had a nuclear stress test in June of this year that was stable.  The question is whether he may be having an acute flare of his ISLD/probable UIP.  We have not biopsied him, nor done an autoimmune workup.  I would like to get a ct chest to see if he has evidence for ground glass infiltrates or worsening of his fibrotic process.  If he has ground glass, would need to consider whether to treat him emperically with a course of prednisone vs doing VATS biopsy first to establish a diagnosis.  If his ct is unchanged, would wonder whether this is really due to his ISLD or something else.  The pt is agreeable to this approach.    Other Orders: Est. Patient Level IV (09811) Radiology Referral (Radiology)  Patient Instructions: 1)  will schedule for ct chest, and will call you with results when available.

## 2010-10-23 NOTE — Progress Notes (Signed)
Summary: ASCEND Trial   Phone Note Outgoing Call   Summary of Call: [facility], as discussed I think he qualifies by crieteria for the ASCEND study. You dont need bronch or bx for the study but needs autoimmune and hx to be negative fo alternative dx. IF you wnat to bx first that is fine. By age and PFT and CT he qualifies but diagnosis has to be < 4 years (when ptaient was told he had IPF). Please let me know after your thoughts on patinet and discussion with patinet Initial call taken by: Kalman Shan MD,  August 09, 2010 5:41 PM  Follow-up for Phone Call        noted.  discussed with pt.  He will get back with me. Follow-up by: Barbaraann Share MD,  August 09, 2010 6:19 PM

## 2010-10-23 NOTE — Progress Notes (Signed)
Summary: Nuclear Pre-Procedure  Phone Note Outgoing Call Call back at [facility] Phone [phone]   Call placed by: Stanton Kidney, EMT-P,  February 27, 2010 2:10 PM Action Taken: Phone Call Completed Summary of Call: Left message with information on Myoview Information Sheet (see scanned document for details).     Nuclear Med Background Indications for Stress Test: Evaluation for Ischemia   History: Heart Catheterization, Myocardial Infarction, Myocardial Perfusion Study, Stents  History Comments: '02 IPWMI > Heart Cath: EF=55%, totalled RCA, 75% CFX > Stent: RCA 4/08 MPS: EF=51%, old infero-lateral scar, (-) ischemia  Symptoms: DOE    Nuclear Pre-Procedure Cardiac Risk Factors: Family History - CAD, History of Smoking, Hypertension, Lipids Height (in): 72

## 2010-10-23 NOTE — Letter (Signed)
Summary: [facility]   Imported By: Lanelle Bal 02/08/2010 10:54:22  _____________________________________________________________________  External Attachment:    Type:   Image     Comment:   External Document  Appended Document: [facility] voiding okay on rapaflo still has stones repeat PSA obtained

## 2010-10-23 NOTE — Assessment & Plan Note (Signed)
Summary: FU HTN/CAD/ANAS  Medications Added NITROGLYCERIN 0.4 MG SUBL (NITROGLYCERIN) One tablet under tongue every 5 minutes as needed for chest pain---may repeat times three        Visit Type:  1 yr f/u Referring Provider:  [name], Bradley May Primary Provider:  [name], Bradley May  CC:  sob....denies any cp or edema.....  History of Present Illness: Bradley May comes today for evaluation and management of his coronary disease.  He is having no symptoms of angina or ischemia. He continues to exercise. He has been diagnosed with interstitial pneumonitis followed by Bradley. Shelle May.  Blood work from primary care shows his lipids to be at goal with an LDL 70 HDL 51.9. LFTs are normal. I shared this with him today. This was in March of 2011.  His last stress Myoview was in 2008.  Clinical Reports Reviewed:  Nuclear Study:  12/31/2006:  Excerise capacity: Good exercise capacity  Blood Pressure response: Normal blood pressure response  Clinical symptoms: No chest pain or dyspnea  ECG impression: No significant ST segment change suggestive of ischemia  Overall impression: No change from 2006. There is old infer-lateral scar with no ischemia.  Bradley Rough, Bradley May   01/16/2005:  Impression: Walking on the treadmill, the patient had no chest pain. There was no significant electrocardiographic change and no significant ectopy. The nuclear images reveal that there is old inferolateral scar. This has been seen in the past. There is no significant ischemia. There is overall good Bradley May motion. There is no eveidence of any significant change from the prior study in 2004.  Bradley Abed, Bradley May, Bradley May   Current Medications (verified): 1)  Aspirin 81 Mg  Tbec (Aspirin) .... Take One By Mouth Once A Day 2)  Crestor 40 Mg  Tabs (Rosuvastatin Calcium) .... Take One By Mouth Once A Day 3)  Metoprolol Succinate 25 Mg  Tb24 (Metoprolol Succinate) .... Take One By Mouth Once A Day 4)  Flonase 50 Mcg/act   Susp (Fluticasone Propionate) .... 2 Sprays Each  Nostril Daily Two Times A Day As Needed 5)  Nitroglycerin 0.4 Mg Subl (Nitroglycerin) .... One Tablet Under Tongue Every 5 Minutes As Needed For Chest Pain---May Repeat Times Three 6)  Diovan 160 Mg Tabs (Valsartan) .... 1/2 Tab By Mouth Daily  Allergies: 1)  Lipitor (Atorvastatin Calcium) 2)  Doxycycline  Past History:  Past Medical History: Last updated: 06/07/2009 Coronary artery disease--------------------------Bradley May    Benign prostatic hypertrophy-----------------------Bradley May  Interstitial lung disease ----------------------------Bradley May Allergic rhinitis  Past Surgical History: Last updated: 06/07/2009 Right knee surgery 1978 MI, angioplasty 1992 MI -Stent RCA 05/02 EGD- bleeding 05/02 Stress cardio, negative EF 62%, normal EF 06/02 Cardiolite -  scar, EF- 56% 04/06 Myoview stress- old inferolat scar, no ischemia, EF 51% 04/08 Ureteral stent for stone--5/10   Bradley May  Family History: Last updated: 2007-05-26 Dad with CAD but died of prostate cancer Dad also had DM Mom and Dad had HTN  Social History: Last updated: May 26, 2007 Marital Status: Married Children: One daughter 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 Head:  normocephalic and atraumatic Eyes:  PERRLA/EOM intact; conjunctiva and lids normal. Neck:  Neck supple, no JVD. No masses, thyromegaly or abnormal cervical nodes. Chest  Bradley May:  no deformities or breast masses noted Lungs:  Clear bilaterally to auscultation and percussion. Heart:  Non-displaced PMI, chest non-tender; regular rate and rhythm, S1, S2 without murmurs, rubs or gallops. Carotid upstroke normal, no bruit. Normal abdominal aortic size, no bruits. Femorals normal pulses, no bruits. Pedals normal pulses. No edema, no varicosities. Msk:  Back normal, normal gait. Muscle strength and tone normal. Pulses:  pulses normal in all 4 extremities Extremities:  No clubbing or cyanosis. Neurologic:  Alert and oriented x 3. Skin:  Intact without lesions or rashes. Psych:  Normal affect.   EKG  Procedure date:  02/22/2010  Findings:      normal sinus rhythm, no acute changes, early R-wave in V2 V3.  Impression & Recommendations:  Problem # 1:  CORONARY ARTERY DISEASE (ICD-414.00) hepdated medication list for this problem includes:    Aspirin 81 Mg Tbec (Aspirin) .Marland Kitchen... Take one by mouth once a day    Metoprolol Succinate 25 Mg Tb24 (Metoprolol succinate) .Marland Kitchen... Take one by mouth once a day    Nitroglycerin 0.4 Mg Subl (Nitroglycerin) ..... One tablet under tongue every 5 minutes as needed for chest pain---may repeat times three I will arrange a followup perfusion scan since his been over 3 years since his last one. Otherwise things appear to be stable with good control of his secondary cardiac risk factors. Orders: EKG w/ Interpretation (93000) Nuclear Stress Test (Nuc Stress Test)  Problem # 2:  HYPERLIPIDEMIA (ICD-272.4) Assessment: Improved  His updated medication list for this problem includes:    Crestor 40 Mg Tabs (Rosuvastatin calcium) .Marland Kitchen... Take one by mouth once a day  Patient Instructions: 1)  Your physician recommends that you schedule a follow-up appointment in: YEAR WITH Bradley Denae Zulueta 2)  Your physician recommends that you continue on your current medications as directed. Please refer to the Current Medication list given to you today. 3)  Your  physician has requested that you have an exercise stress myoview.  For further information please visit [email or web address]  Please follow instruction sheet, as given. Prescriptions: NITROGLYCERIN 0.4 MG SUBL (NITROGLYCERIN) One tablet under tongue every 5 minutes as needed for chest pain---may repeat times three  #25 x 9   Entered by:   Bradley May, Bradley May   Authorized by:   Gaylord Shih, Bradley May, [facility]   Signed by:   Bradley May, Bradley May on 02/22/2010   Method used:   Electronically to        [facility]* (retail)       [address]       [place], [place]  [address]       Ph: [phone]       Fax: [phone]   RxID:   [id number]

## 2010-10-23 NOTE — Miscellaneous (Signed)
   Clinical Lists Changes  Observations: Added new observation of COLONOSCOPY: Results: Diverticulosis.   No polyps Dr Earlean Polka @ [facility] endoscopy (02/10/2009 8:43)      Colonoscopy  Procedure date:  02/10/2009  Findings:      Results: Diverticulosis.   No polyps Dr Earlean Polka @ [facility] endoscopy

## 2010-10-23 NOTE — Assessment & Plan Note (Signed)
Summary: CPX/RBH   Vital Signs:  Patient profile:   60 year old male Weight:      190 pounds BMI:     25.86 Temp:     98.1 degrees F oral Pulse rate:   68 / minute Pulse rhythm:   regular BP sitting:   116 / 60  (left arm) Cuff size:   large  Vitals Entered By: Mervin Hack CMA Duncan Dull) (November 21, 2009 8:30 AM) CC: adult physical   History of Present Illness: Hoarseness is a little better Did see ENT who just saw mild inflammation Feels the diovan has helped this some  Had colonoscopy in [place] last year  On rapaflo from Dr [name] Urinary problems did get better at first but has waned some Less nocturia and less urgency in day PSA testing has been okay there No recent kidney stones  No heart problems still does the steps at work--- 20 minutes up and back  Allergies: 1)  Lipitor (Atorvastatin Calcium) 2)  Doxycycline  Past History:  Past medical, surgical, family and social histories (including risk factors) reviewed for relevance to current acute and chronic problems.  Past Medical History: Reviewed history from 06/07/2009 and no changes required. Coronary artery disease--------------------------Dr Wall    Benign prostatic hypertrophy-----------------------Dr Cope  Interstitial lung disease ----------------------------Dr [name] Allergic rhinitis  Past Surgical History: Reviewed history from 06/07/2009 and no changes required. Right knee surgery 1978 MI, angioplasty 1992 MI -Stent RCA 05/02 EGD- bleeding 05/02 Stress cardio, negative EF 62%, normal EF 06/02 Cardiolite -  scar, EF- 56% 04/06 Myoview stress- old inferolat scar, no ischemia, EF 51% 04/08 Ureteral stent for stone--5/10   Dr [name]  Family History: Reviewed history from 05/07/2007 and no changes required. Dad with CAD but died of prostate cancer Dad also had DM Mom and Dad had HTN  Social History: Reviewed history from 05/07/2007 and no changes required. 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 Eyes:  Denies double vision and vision loss-1 eye. ENT:  Denies decreased hearing and ringing in ears; teeth okay--regular with dentist. CV:  Denies chest pain or discomfort, difficulty breathing at night, difficulty breathing while lying down, fainting, lightheadness, palpitations, and shortness of breath with exertion. Resp:  Denies cough and shortness of breath. GI:  Denies abdominal pain, bloody stools, change in bowel habits, dark tarry stools, indigestion, nausea, and vomiting. GU:  See HPI; Complains of nocturia and urinary hesitancy; denies erectile dysfunction. MS:  Denies joint pain and joint swelling. Derm:  Complains of lesion(s); denies rash; several red areas on face. Neuro:  Denies headaches, numbness, and tingling. Heme:  Denies abnormal bruising and enlarge lymph nodes. Allergy:  Complains of seasonal allergies and sneezing; feels this is causing throat symptoms on nasal spray.  Physical Exam  General:  alert and normal appearance.   Eyes:  pupils equal, pupils round, pupils reactive to light, and no optic disk abnormalities.   Ears:  R ear normal and L ear normal.   Mouth:  no erythema, no exudates, and no lesions.   Neck:  supple, no masses, no thyromegaly, no carotid bruits, and no cervical lymphadenopathy.   Lungs:  normal respiratory effort, no intercostal retractions, no accessory muscle use, and no wheezes.   Bibasilar crackles--slightly more prominent on right Heart:  normal rate, regular rhythm, no murmur, and no gallop.   Abdomen:  soft, non-tender, and no masses.   Msk:  no joint tenderness and no joint swelling.   Pulses:  normal in feet Extremities:  no edema Neurologic:  alert & oriented X3, strength normal in all extremities, and gait normal.   Skin:  no rashes and no ulcerations.    3 actinics on face---left forehead, left cheek, right temple Axillary Nodes:  No palpable lymphadenopathy Psych:  normally interactive, good eye contact, not anxious appearing, and not depressed appearing.     Impression & Recommendations:  Problem # 1:  PREVENTIVE HEALTH CARE (ICD-V70.0) Assessment Comment Only doing well Dr cope does PSA will get records of colonoscopy pneumovax  Problem # 2:  CORONARY ARTERY DISEASE (ICD-414.00) Assessment: Comment Only  quiet discussed fitness and lifestyle Dr [name] follows  The following medications were removed from the medication list:    Diovan 80 Mg Tabs (Valsartan) .Marland Kitchen... Take 1 by mouth once daily His updated medication list for this problem includes:    Aspirin 81 Mg Tbec (Aspirin) .Marland Kitchen... Take one by mouth once a day    Metoprolol Succinate 25 Mg Tb24 (Metoprolol succinate) .Marland Kitchen... Take one by mouth once a day    Nitroglycerin 0.4 Mg Subl (Nitroglycerin) ..... One tablet under tongue every 5 minutes as needed for chest pain---may repeat times three    Diovan 160 Mg Tabs (Valsartan) .Marland Kitchen... 1/2 tab by mouth daily  Orders: TLB-Renal Function Panel (80069-RENAL) TLB-CBC Platelet - w/Differential (85025-CBCD) TLB-TSH (Thyroid Stimulating Hormone) (84443-TSH)  Problem # 3:  HYPERLIPIDEMIA (ICD-272.4) Assessment: Unchanged  no problems with meds will check labs yearly after today  His updated medication list for this problem includes:    Crestor 40 Mg Tabs (Rosuvastatin calcium) .Marland Kitchen... Take one by mouth once a day  Labs Reviewed: SGOT: 22 (06/07/2009)   SGPT: 21 (06/07/2009)   HDL:45.20 (06/07/2009), 38.7 (11/15/2008)  LDL:97 (06/07/2009), 90 (11/15/2008)  Chol:158 (06/07/2009), 149 (11/15/2008)  Trig:77.0 (06/07/2009), 104 (11/15/2008)  Orders: TLB-Lipid Panel (80061-LIPID) TLB-Hepatic/Liver Function Pnl (80076-HEPATIC) Venipuncture (25956)  Problem # 4:  BENIGN PROSTATIC HYPERTROPHY/PROSTATITIS (ICD-600.00) Assessment:  Improved better with rapaflo  Problem # 5:  INTERSTITIAL LUNG DISEASE (ICD-515) Assessment: Comment Only asymptomatic  Problem # 6:  ACTINIC KERATOSIS (ICD-702.0) Assessment: New  each lesion treated with liquid nitrogen for 40 seconds x 2 tolerated well discussed home care and sun protection  Orders: Cryotherapy/Destruction benign or premalignant lesion (1st lesion)  (17000) Cryotherapy/Destruction benign or premalignant lesion (2nd-14th lesions) (17003)  Complete Medication List: 1)  Aspirin 81 Mg Tbec (Aspirin) .... Take one by mouth once a day 2)  Crestor 40 Mg Tabs (Rosuvastatin calcium) .... Take one by mouth once a day 3)  Metoprolol Succinate 25 Mg Tb24 (Metoprolol succinate) .... Take one by mouth once a day 4)  Flonase 50 Mcg/act Susp (Fluticasone propionate) .... 2 sprays each  nostril daily two times a day 5)  Nitroglycerin 0.4 Mg Subl (Nitroglycerin) .... One tablet under tongue every 5 minutes as needed for chest pain---may repeat times three 6)  Rapaflo 8 Mg Caps (Silodosin) .... Take 1 by mouth once daily 7)  Diovan 160 Mg Tabs (Valsartan) .... 1/2 tab by mouth daily  Patient Instructions: 1)  Get colonoscopy report from [facility] 2)  Please try loratadine 10mg  1-2 daily &/or cetirizine 10mg  daily for allergies 3)  Please schedule a follow-up appointment in 1 year.  Prescriptions: DIOVAN 160 MG TABS (VALSARTAN) 1/2 tab by mouth daily  #15 x 12   Entered and Authorized  by:   Cindee Salt MD   Signed by:   Cindee Salt MD on 11/21/2009   Method used:   Electronically to        [facility]* (retail)       [address]       [place], [place]  [address]       Ph: [phone]       Fax: [phone]   RxID:   [id number]   Current Allergies (reviewed today): LIPITOR (ATORVASTATIN CALCIUM) DOXYCYCLINE  Appended Document: CPX/RBH     Clinical Lists Changes  Orders: Added new Service order of  Pneumococcal Vaccine (88416) - Signed Added new Service order of Admin 1st Vaccine (60630) - Signed Added new Service order of Admin 1st Vaccine [facility]) [phone]) - Signed Observations: Added new observation of PNEUMOVAXVIS: 04/20/96 version given November 21, 2009. (11/21/2009 9:33) Added new observation of PNEUMOVAXLOT: 1486z (11/21/2009 9:33) Added new observation of PNEUMOVAXEXP: 05/07/2011 (11/21/2009 9:33) Added new observation of PNEUMOVAXBY: DeShannon Smith CMA (AAMA) (11/21/2009 9:33) Added new observation of PNEUMOVAXRTE: IM (11/21/2009 9:33) Added new observation of PNEUMOVAXMFR: Merck (11/21/2009 9:33) Added new observation of PNEUMOVAXSIT: right deltoid (11/21/2009 9:33) Added new observation of PNEUMOVAX: Pneumovax (11/21/2009 9:33)       Pneumovax Vaccine    Vaccine Type: Pneumovax    Site: right deltoid    Mfr: Merck    Dose: 0.5 ml    Route: IM    Given by: Mervin Hack CMA (AAMA)    Exp. Date: 05/07/2011    Lot #: [id number]    VIS given: 04/20/96 version given November 21, 2009.

## 2010-12-03 ENCOUNTER — Encounter: Payer: Self-pay | Admitting: Internal Medicine

## 2010-12-03 ENCOUNTER — Ambulatory Visit (INDEPENDENT_AMBULATORY_CARE_PROVIDER_SITE_OTHER): Payer: 59 | Admitting: Pulmonary Disease

## 2010-12-03 ENCOUNTER — Encounter: Payer: Self-pay | Admitting: Pulmonary Disease

## 2010-12-03 ENCOUNTER — Encounter (INDEPENDENT_AMBULATORY_CARE_PROVIDER_SITE_OTHER): Payer: 59

## 2010-12-03 DIAGNOSIS — J8410 Pulmonary fibrosis, unspecified: Secondary | ICD-10-CM

## 2010-12-03 DIAGNOSIS — I2510 Atherosclerotic heart disease of native coronary artery without angina pectoris: Secondary | ICD-10-CM | POA: Insufficient documentation

## 2010-12-03 DIAGNOSIS — R0609 Other forms of dyspnea: Secondary | ICD-10-CM

## 2010-12-03 DIAGNOSIS — N400 Enlarged prostate without lower urinary tract symptoms: Secondary | ICD-10-CM | POA: Insufficient documentation

## 2010-12-03 DIAGNOSIS — J309 Allergic rhinitis, unspecified: Secondary | ICD-10-CM | POA: Insufficient documentation

## 2010-12-03 DIAGNOSIS — J849 Interstitial pulmonary disease, unspecified: Secondary | ICD-10-CM

## 2010-12-03 LAB — PULMONARY FUNCTION TEST

## 2010-12-20 NOTE — Assessment & Plan Note (Signed)
Summary: pft charges   Allergies: 1)  Lipitor (Atorvastatin Calcium) 2)  Doxycycline   Other Orders: Carbon Monoxide diffusing w/capacity (29518) Lung Volumes/Gas dilution or washout (84166) Spirometry (Pre & Post) (06301)

## 2010-12-20 NOTE — Assessment & Plan Note (Signed)
Summary: rov for ISLD   Copy to:  [name], MD Primary Provider/Referring Provider:  [name], MD  CC:  Ov to discuss PFT results.  Pt states breathing is unchanged from last visit- still has sob with exertion.  States mostly non productive cough but will occ cough up scant amounts of clear sputum. 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  His breathing is unchanged from last visit, and his pfts today are similar to 2010, but better from 2011.  His doe is at his usual baseline, and he has only minimal dry cough.  He is satisfied with his exertional tolerance.    Medications Prior to Update: 1)  Aspirin 81 Mg  Tbec (Aspirin) .... Take One By Mouth Once A Day 2)  Crestor 40 Mg  Tabs (Rosuvastatin Calcium) .... Take One By Mouth Once A Day 3)  Metoprolol Succinate 25 Mg  Tb24 (Metoprolol Succinate) .... Take One By Mouth Once A Day 4)  Flonase 50 Mcg/act  Susp (Fluticasone Propionate) .... 2 Sprays Each  Nostril Daily Two Times A Day As Needed 5)  Nitroglycerin 0.4 Mg Subl (Nitroglycerin) .... One Tablet Under Tongue Every 5 Minutes As Needed For Chest Pain---May Repeat Times Three 6)  Diovan 160 Mg Tabs (Valsartan) .... 1/2 Tab By Mouth Daily  Allergies (verified): 1)  Lipitor (Atorvastatin Calcium) 2)  Doxycycline  Review of Systems       The patient complains of shortness of breath with activity, productive cough, non-productive cough, and nasal congestion/difficulty breathing through nose.  The patient denies shortness of breath at rest, coughing up blood, chest pain, irregular heartbeats, acid heartburn, indigestion, loss of appetite, weight change, abdominal pain, difficulty swallowing, sore throat, tooth/dental problems, headaches, sneezing, itching, ear ache, anxiety, depression, hand/feet swelling, joint stiffness or pain, rash, change in color of mucus, and fever.    Vital  Signs:  Patient profile:   60 year old male Height:      72 inches Weight:      187 pounds BMI:     25.45 O2 Sat:      94 % on Room air Temp:     98.1 degrees F oral Pulse rate:   80 / minute BP sitting:   116 / 72  (left arm) Cuff size:   regular  Vitals Entered By: Arman Filter LPN (December 03, 2010 9:50 AM)  O2 Flow:  Room air CC: Ov to discuss PFT results.  Pt states breathing is unchanged from last visit- still has sob with exertion.  States mostly non productive cough but will occ cough up scant amounts of clear sputum.  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  I continue to discuss the various options for treatment at this point, including the ASCEND trial with pirfenadone vs lung biopsy with immunosuppressive therapy if something other than  UIP on biopsy.  The pt is clinically stable by QOL and pfts, and wishes to continue surveillance at this point.  If will consider further the drug study, and call me if he is interested.   Other Orders: Est. Patient Level III (71696)  Patient Instructions: 1)  please call if you would like to consider the drug study for your lung disease 2)  stay as active as possible, and followup with me in 6mos.  Will check cxr next visit.

## 2010-12-27 ENCOUNTER — Encounter: Payer: Self-pay | Admitting: Pulmonary Disease

## 2011-01-02 ENCOUNTER — Other Ambulatory Visit: Payer: Self-pay | Admitting: Internal Medicine

## 2011-01-10 ENCOUNTER — Other Ambulatory Visit: Payer: Self-pay | Admitting: Internal Medicine

## 2011-01-29 ENCOUNTER — Ambulatory Visit (INDEPENDENT_AMBULATORY_CARE_PROVIDER_SITE_OTHER): Payer: 59 | Admitting: Internal Medicine

## 2011-01-29 ENCOUNTER — Other Ambulatory Visit: Payer: Self-pay | Admitting: Internal Medicine

## 2011-01-29 ENCOUNTER — Encounter: Payer: Self-pay | Admitting: Internal Medicine

## 2011-01-29 VITALS — BP 118/60 | HR 64 | Temp 98.0°F | Ht 72.0 in | Wt 182.0 lb

## 2011-01-29 DIAGNOSIS — N400 Enlarged prostate without lower urinary tract symptoms: Secondary | ICD-10-CM

## 2011-01-29 DIAGNOSIS — E785 Hyperlipidemia, unspecified: Secondary | ICD-10-CM

## 2011-01-29 DIAGNOSIS — Z0000 Encounter for general adult medical examination without abnormal findings: Secondary | ICD-10-CM

## 2011-01-29 DIAGNOSIS — L570 Actinic keratosis: Secondary | ICD-10-CM

## 2011-01-29 DIAGNOSIS — J8410 Pulmonary fibrosis, unspecified: Secondary | ICD-10-CM

## 2011-01-29 DIAGNOSIS — I2510 Atherosclerotic heart disease of native coronary artery without angina pectoris: Secondary | ICD-10-CM

## 2011-01-29 LAB — HEPATIC FUNCTION PANEL
ALT: 19 U/L (ref 0–53)
AST: 22 U/L (ref 0–37)
Albumin: 4 g/dL (ref 3.5–5.2)
Alkaline Phosphatase: 34 U/L — ABNORMAL LOW (ref 39–117)
Bilirubin, Direct: 0.1 mg/dL (ref 0.0–0.3)
Total Bilirubin: 0.9 mg/dL (ref 0.3–1.2)
Total Protein: 7.5 g/dL (ref 6.0–8.3)

## 2011-01-29 LAB — CBC WITH DIFFERENTIAL/PLATELET
Basophils Relative: 0.2 % (ref 0.0–3.0)
Eosinophils Absolute: 0.1 10*3/uL (ref 0.0–0.7)
Hemoglobin: 14.4 g/dL (ref 13.0–17.0)
MCHC: 33.7 g/dL (ref 30.0–36.0)
MCV: 94.4 fl (ref 78.0–100.0)
Monocytes Absolute: 0.5 10*3/uL (ref 0.1–1.0)
Neutro Abs: 5.6 10*3/uL (ref 1.4–7.7)
RBC: 4.53 Mil/uL (ref 4.22–5.81)

## 2011-01-29 LAB — BASIC METABOLIC PANEL
Calcium: 9.7 mg/dL (ref 8.4–10.5)
Chloride: 104 mEq/L (ref 96–112)
Creatinine, Ser: 1 mg/dL (ref 0.4–1.5)
GFR: 81.94 mL/min (ref >60.00)

## 2011-01-29 LAB — TSH: TSH: 0.6 u[IU]/mL (ref 0.35–5.50)

## 2011-01-29 LAB — LIPID PANEL
HDL: 37.7 mg/dL — ABNORMAL LOW (ref >39.00)
Total CHOL/HDL Ratio: 4
VLDL: 33.2 mg/dL (ref 0.0–40.0)

## 2011-01-29 NOTE — Progress Notes (Signed)
Subjective:    Patient ID: Bradley May, male    DOB: 27-Aug-1951, 60 y.o.   MRN: [id number]  HPI Had stress test with Dr Salvatore Marvel good  Seeing Dr [name] tomorrow for his annual visit Recent urinary infection with burning dysuria  Has been seeing Dr [name] Interstitial lung disease---some better on most recent PFTs Holding off on biopsy or any study for now  Recent derm screening Prescribed a cream for spot on his nose (flaky area)---didn't fill due to expense [facility] apparently  Current outpatient prescriptions:aspirin 81 MG tablet, Take 81 mg by mouth daily.  , Disp: , Rfl: ;  CRESTOR 40 MG tablet, TAKE 1 TABLET EVERY DAY, Disp: 30 tablet, Rfl: 2;  fluticasone (FLONASE) 50 MCG/ACT nasal spray, USE TWO PUFFS INTO EACH NOSTRIL DAILY, Disp: 16 g, Rfl: 2;  metoprolol tartrate (LOPRESSOR) 25 MG tablet, Take 25 mg by mouth daily.  , Disp: , Rfl:  nitroGLYCERIN (NITROSTAT) 0.4 MG SL tablet, Place 0.4 mg under the tongue every 5 (five) minutes as needed. For chest pain. May repeat 3 times , Disp: , Rfl: ;  valsartan (DIOVAN) 160 MG tablet, Take 80 mg by mouth daily.  , Disp: , Rfl:   Past Medical History  Diagnosis Date  . CAD (coronary artery disease)     Dr. [name]  . BPH (benign prostatic hypertrophy)     Dr. [name]  . Interstitial lung disease     Dr. [name]  . Allergic rhinitis     Past Surgical History  Procedure Date  . Knee surgery 1978    Right  . Angioplasty 1992  . Coronary angioplasty with stent placement 01/2001    RCA  . Esophagogastroduodenoscopy 01/2001    bleeding  . Ureteral stent placement 01/2009    Dr. [name]    Family History  Problem Relation Age of Onset  . Coronary artery disease Father   . Cancer Father     prostate  . Diabetes Father   . Hypertension Father   . Hypertension Mother     History   Social History  . Marital Status: Married    Spouse Name: N/A    Number of Children: 1  . Years of Education: N/A   Occupational History  .  Risk analyst    Social History Main Topics  . Smoking status: Former Smoker    Quit date: 09/23/1990  . Smokeless tobacco: Not on file  . Alcohol Use: No  . Drug Use: Not on file  . Sexually Active: Not on file   Other Topics Concern  . Not on file   Social History Narrative  . No narrative on file   Review of Systems  Constitutional: Negative for fever and fatigue.       Weight is down 8# in 1 year---working on portion control and exercising more Wears seat belt  HENT: Positive for congestion and rhinorrhea. Negative for hearing loss, dental problem and tinnitus.        Keeps up with dentist Uses OTC med for allergies  Eyes: Negative for visual disturbance.       No focal vision loss or diplopia  Respiratory: Positive for cough and shortness of breath. Negative for chest tightness.        Stable DOE  Cardiovascular: Negative for chest pain, palpitations and leg swelling.  Gastrointestinal: Negative for nausea, vomiting, abdominal pain, constipation and blood in stool.       No heartburn  Genitourinary: Positive for dysuria and difficulty urinating.  Negative for urgency.       No sexual problems  Musculoskeletal: Negative for back pain, joint swelling and arthralgias.  Skin: Positive for rash. Negative for color change.  Neurological: Negative for dizziness, syncope, weakness, numbness and headaches.  Hematological: Negative for adenopathy. Does not bruise/bleed easily.       Objective:   Physical Exam  Constitutional: He is oriented to person, place, and time. He appears well-developed and well-nourished. No distress.  HENT:  Head: Normocephalic and atraumatic.  Right Ear: External ear normal.  Left Ear: External ear normal.  Mouth/Throat: Oropharynx is clear and moist. No oropharyngeal exudate.       TMs fine  Eyes: Conjunctivae and EOM are normal. Pupils are equal, round, and reactive to light.       Fundi benign  Neck: Normal range of motion. Neck supple.  No thyromegaly present.  Cardiovascular: Normal rate, regular rhythm, normal heart sounds and intact distal pulses.  Exam reveals no gallop.   No murmur heard. Pulmonary/Chest: Effort normal. No respiratory distress. He has no wheezes. He has rales.       Fine crackles slightly more than half way up bilaterally  Abdominal: Soft. Bowel sounds are normal. He exhibits no mass. There is no tenderness.  Musculoskeletal: Normal range of motion. He exhibits no edema and no tenderness.  Lymphadenopathy:    He has no cervical adenopathy.  Neurological: He is alert and oriented to person, place, and time. He exhibits normal muscle tone.       Normal strength and gait  Skin: Skin is warm. No rash noted.       Actinic on bridge of nose          Assessment & Plan:

## 2011-01-30 ENCOUNTER — Ambulatory Visit: Payer: Self-pay | Admitting: Urology

## 2011-02-05 NOTE — Assessment & Plan Note (Signed)
[facility]                            CARDIOLOGY OFFICE NOTE   ANTONIE, BORJON                      MRN:          [id number]  DATE:12/23/2007                            DOB:          08-Oct-1950    HISTORY OF PRESENT ILLNESS:  Mr. Galen returns today for further  management of his coronary artery disease.   He is having no angina, no dyspnea on exertion.  No palpitations.  No  presyncope, syncope, orthopnea, PND.   He had an inferior wall infarct in 2002.  He had a stent to the right  coronary artery.  His last stress Myoview was December 31, 2006, that showed  excellent exercise tolerance of 10 minutes.  EF 51%.  Inferolateral scar  which was old.  There was no ischemia.   He is still plagued by this cough at the base of his neck.  It is worse  in the morning.  He has noticed some improvement on Diovan versus  lisinopril (please see previous notes) per Dr. Alphonsus Sias.  However, it is  still a real problem.  He denies any reflux symptoms, but he may be  having chronic aspiration.   CURRENT MEDICATIONS:  1. Diovan 80 mg a day.  2. Metoprolol 25 mg a day.  3. Crestor 40 mg a day.  4. Aspirin 81 mg a day.   PHYSICAL EXAMINATION:  VITAL SIGNS:  Blood pressure is 106/82, his pulse  65 and regular.  Weight is 190.  HEENT:  Normocephalic atraumatic.  PERRL.  Extraocular is intact.  Sclerae are clear.  Facial symmetry is normal.  Carotids were equal  bilaterally without bruits.  No JVD.  Thyroid is not enlarged.  Trachea  is midline.  LUNGS:  Reveal bibasilar crackles that do not clear with coughing in  both bases.  There is no wheezes, rhonchi.  HEART:  Reveals a nondisplaced PMI, normal S1-S2 without murmur, rub or  gallop.  There is no tenderness over his trachea.  It is midline.  ABDOMEN:  Soft, good bowel sounds.  No tenderness.  No thyromegaly.  EXTREMITIES:  No sinus clubbing or edema.  Pulses are intact.  NEUROLOGICAL:  Intact.   STUDIES:  Electrocardiogram shows normal sinus rhythm with an old  inferior posterior wall infarct.   ASSESSMENT:  I have had a long talk with Mr. Yandell today.  I have  recommend over-the-counter protime pump inhibitor until he sees Dr.  Alphonsus Sias in a couple of weeks.  I am very  suspicious of an aspiration problem, and he probably needs to see a  pulmonologist.  I will leave this to Dr. Karle Starch expertise.     Thomas C. [name], MD, [facility]  Electronically Signed    TCW/MedQ  DD: 12/23/2007  DT: 12/24/2007  Job #: [id number]   cc:   [name], MD

## 2011-02-08 NOTE — Assessment & Plan Note (Signed)
[facility]                            CARDIOLOGY OFFICE NOTE   BARETT, WHIDBEE                      MRN:          [id number]  DATE:12/23/2006                            DOB:          [birth year]    Bradley May returns today for further management of his coronary  artery disease. He is having no angina, only some mild dyspnea on  exertion.   Switching from lisinopril to Diovan did not change his cough. Dr. Alphonsus Sias  switched him back to lisinopril thinking this was allergies.   He has not exercised much, but he says he walks up and down the steps,  18 in number, 20 times a day at work.   His last stress Myoview was January 16, 2005 ejection fraction 56%, old  inferior lateral scar, no ischemia.   His meds are unchanged. Dr. Alphonsus Sias is checking his blood work   His blood pressure today is 115/77, pulse 61 and regular, his weight is  181.  HEENT: Normocephalic, atraumatic. PERRLA: Extraocular movements intact,  sclera clear. Facial symmetry is normal. Dentition is satisfactory. He  is pale.  NECK: Supple, carotid upstrokes are equal bilaterally without bruits. No  JVD. Thyroid is not enlarged. Trachea is midline.  LUNGS: Clear.  HEART: Reveals a soft S1, S2 without murmur, rub, or gallop.  ABDOMINAL EXAM: Soft good bowel sounds, no midline bruits.  EXTREMITIES: Reveal edema. Pulses are intact.  NEURO EXAM: Intact.   EKG: Shows normal sinus rhythm with increased RS ratio V1, V2, V3, with  an RSR prime of V1. This old and unchanged.   ASSESSMENT/PLAN:  Bradley May is doing well. I have made no changes in  his medical program. We will arrange for him to have a rest stress  Myoview off of metoprolol, assuming this is negative we will see him  back in a year.     Thomas C. [name], MD, [facility]  Electronically Signed    TCW/MedQ  DD: 12/23/2006  DT: 12/23/2006  Job #: [id number]   cc:   [name], MD

## 2011-02-08 NOTE — H&P (Signed)
[place]  Patient:    Bradley May, Bradley May                      MRN: [id number] Adm. Date:  01/26/01 Attending:  Jesse Sans. [name], M.D. [facility] CC:         [name], M.D., [facility], [address] [place], [place]                         History and Physical  CHIEF COMPLAINT: Chest pressure at about 6:30 p.m.  HISTORY OF PRESENT ILLNESS: Mr. Archuletta is a 60 year old married white male, father of one, who comes to the catheterization laboratory directly via [facility] with an acute inferoposterior MI.  He is about three hours into his symptom onset and was brought to the emergency room at [facility] in [place].  His past medical history is significant for a previous left circumflex infarct in April 1992.  He subsequently had PTCA by Dr. Peter Swaziland with a re-look that showed no restenosis.  His symptom onset was around 6:30 p.m. and he arrived at [facility], where he had an EKG that showed ST elevation in 2, 3, and aVF, with increased R waves in V2 and V3, with ST segment depression in 1, aVL, V1 and V2.  He was treated with aspirin, intravenous nitroglycerin, unfractionated heparin 5000 unit bolus with a drip, and transferred immediately for primary PTCA and stenting.  PAST MEDICAL HISTORY:  1. Hyperlipidemia.  He had been on Pravachol in the past but discontinued     this three months ago.  It is not clear whether he was having side effects     versus "just wanted to clean my system out."  2. History of hypertension, for which he stopped an unknown med three months     ago as well.  There is no previous history of diabetes.  ALLERGIES: No known drug allergies.  CURRENT MEDICATIONS: He is currently on no medications.  SOCIAL HISTORY: He quit smoking ten years ago at the time of his infarct.  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  He is married and has one daughter, who is in her late 63s.  FAMILY HISTORY: There is a family history of premature heart disease.  REVIEW OF SYSTEMS: Noncontributory.  No previous history of stroke, GI bleeding, or bleeding diathesis.  PHYSICAL EXAMINATION:  VITAL SIGNS: Blood pressure 127/67, pulse 80 and regular.  Oxygen saturation 98%.  NECK: No JVD.  Carotid upstrokes equal bilaterally without bruits.  No thyromegaly.  LUNGS: Clear to auscultation.  CARDIAC: Regular rate and rhythm without gallop or rub, 2/6 systolic murmur at the apex.  ABDOMEN: Soft, good bowel sounds, no hepatosplenomegaly.  EXTREMITIES: No clubbing, cyanosis, or edema.  Pulses were 3+/4+ including femorals without bruits.  No edema distally.  Distal pulses are also brisk.  LABORATORY DATA: Laboratory data from [facility] showed a creatinine of 1.1.  Potassium 3.5.  Hemoglobin 14, platelets 239,000.  Normal PT and PTT.  Chest x-ray was not sent.  ASSESSMENT:  1. Acute inferoposterior myocardial infarction.  Ongoing pictures in the     catheterization laboratory show a totally occluded proximal right coronary     artery.  There appears to be a dominant right system.  2. History of previous left circumflex infarct in  1992 with subsequent     percutaneous transluminal coronary angioplasty.  3. History of remote tobacco.  4. Hypertension.  5. Hyperlipidemia.  6. Noncompliance.  PLAN:  1. Dr. Riley Kill is in the process of performing a percutaneous coronary     intervention with integrilin on board.  2. Cardiac risk factor modification.  3. The patient will obviously need hyperlipidemic therapy as well as ACE     inhibitor and perhaps beta-blocker depending on his left ventricular     function.  He will continue with aspirin and Plavix assuming he is going     to have a stent.  4. He will need to follow up with me in the [place], [place]  office     several weeks after discharge.  I left Dr. Alphonsus Sias a voice-mail message. DD:  01/26/01 TD:  01/27/01 Job: [id number]

## 2011-02-08 NOTE — Letter (Signed)
May 28, 2006     [name], MD  [address] [place], [place] [address]   RE:  Bradley May, Bradley May  MRN:  [id number]  /  DOB:  August 03, 1951   Dear Dr. Alphonsus Sias:   Upon your kind referral, I had the pleasure of evaluating your patient and I  am pleased to offer my findings. I saw [name] in the office today.  Enclosed is a copy of my progress note that details my findings and  recommendations.   Thank you for the opportunity to participate in your patient's care.    Sincerely,      Barbette Hair. [name], MD,FACG   RDK/MedQ  DD:  05/28/2006  DT:  05/28/2006  Job #:  [id number]

## 2011-02-08 NOTE — Cardiovascular Report (Signed)
[place]. [facility]  Patient:    Bradley May, Bradley May                      MRN: [id number] Proc. Date: 01/26/01 Attending:  Arturo Morton. Riley Kill, M.D. [facility] CC:         Thomas C. Wall, M.D. [facility]  CV Laboratory   Cardiac Catheterization  INDICATIONS:  Mr. Correia is a pleasant 60 year old who has previously undergone angioplasty of a subtotally occluded circumflex.  He has done but recently stopped both his lipid-lowering therapy as well as beta blockers.  He was feeling well but developed sudden onset of chest pain tonight at 6:30.  He came to the emergency room where EKG demonstrated evidence of an acute inferior wall infarction.  He was transferred to [facility] for urgent catheterization and possible reperfusion therapy.  He was brought to the lab emergently where he was seen by Dr. [name] and myself.  PROCEDURE: 1. Left heart catheterization. 2. Selective coronary arteriography. 3. Selective left ventriculography. 4. Insertion of temporary transvenous pacer. 5. Percutaneous transluminal coronary angioplasty and stenting of the distal    right coronary artery.  CARDIOLOGIST:  Arturo Morton. Riley Kill, M.D. [facility]  DESCRIPTION OF THE PROCEDURE:  The patient was brought to the catheterization lab and prepped and draped in the usual fashion; 1% Xylocaine was used for local anesthesia.  Through an anterior puncture, the right femoral artery was easily entered.  A 7-French sheath was placed and used left and right coronary arteries to obtain multiple angiographic projections.  Ventriculography was performed in the RAO projection.  Preparations were made then for percutaneous coronary intervention.  ACT was checked.  Appropriate additional heparin was given to take the ACT between 200 and 300.  Integrilin was ordered.  A JR4 guiding catheter with side holes was utilized to intubate the right coronary artery.  The total occlusion was crossed using a 0.014  high-torque floppy wire.  We then crossed using a 2.5 mm CrossSail balloon.  With reperfusion, there was bradycardia and hypotension, and intravenous atropine 1 mg was administered.  The blood pressure and pulse then came up.  A temporary transvenous pacer had been placed in the inferior vena cava in case it was needed.  Following this, the artery was dilated using a 2.5 mm CrossSail balloon.  This was followed by a 3.0 x 18 Guidant Penta stent which was taken up to about 15 atmospheres with marked improvement in the appearance of the artery.  This stent was then post dilated using a 3.5 mm Quantum Ranger 9 mm length balloon throughout the distal portion of the mid stent, the mid stent in the more proximal portion of the stent and taken up to about 12 atmospheres.  There was marked improvement in the appearance of the artery. TIMI-3 flow was established.  Onset of chest pain was at 6:30 p.m.  The [facility] arrival was a 7:55, and the reperfusion was achieved at 9:59. Total balloon time was 124 minutes, and reperfusion time was 3 hours and 29 minutes.  There were no major complications.  He was taken to the CCU in satisfactory clinical condition.  HEMODYNAMIC DATA:  The initial central aortic pressure was 103/73, LV pressure 99/28.  No gradient on pullback across the aortic valve.  ANGIOGRAPHIC DATA:  The left main coronary artery was free of significant disease.  The LAD coursed to the apex.  It provided a very large intermediate or optional branch.  This optional  diagonal branch had mid irregularity of about 40 to 50%.  The LAD proper had about 50% after the septal perforator, and the LAD proper was a thin caliber vessel after the origin of the large diagonal. The large diagonal had some mild irregularity proximally.  The circumflex proper had about a 75% stenosis prior to the origin of the first marginal.  The first marginal had about 30% narrowing.  There was about 80%  narrowing in the A-V circumflex beyond the first marginal takeoff leading into the second marginal.  The right coronary artery had mild luminal irregularity proximally and was totally occluded distally.  Following reperfusion and stenting, this distal stenosis was reduced to 0%.  TIMI-0 flow was taken to TIMI-3 flow.  The distal vessel consisted of a bifurcating PDA as well as two smaller posterolateral branches.  Ventriculography in the RAO projection revealed an ejection fraction approximating 55% with inferobasal hypokinesis to near akinesis.  There was perhaps a very mild mitral regurgitation.  CONCLUSIONS: 1. Preserved overall left ventricular function with an inferobasal wall    motion abnormality. 2. Total occlusion of the mid right coronary artery in the setting of acute    inferior infarction treated with successful reperfusion therapy and    primary percutaneous transluminal coronary angioplasty. 3. Multiple lesions involving the atrioventricular circumflex as noted    above. 4. Moderate abnormalities of the left anterior descending artery as    described.  DISPOSITION:  We will continue medical therapy at the present time.  Aspirin and Plavix will be needed, and we will slowly titrate up beta blockade.   We will try to limit this and possibly use ACE inhibitors for his blood pressure. "Statin" therapy will be initiated. DD:  01/26/01 TD:  01/27/01 Job: [id number]

## 2011-02-08 NOTE — Assessment & Plan Note (Signed)
[facility]                           GASTROENTEROLOGY OFFICE NOTE   Bradley May, Bradley May                      MRN:          [id number]  DATE:05/28/2006                            DOB:          [birth year]    REASON FOR CONSULTATION:  Colorectal cancer screening.   Mr. Troiani is a pleasant 60 year old white male, referred through the  courtesy of Dr. Alphonsus Sias for evaluation.  Colonoscopy has been requested for  screening purposes.  Mr. Thomley has no GI complaints including change in  bowel habits, abdominal pain, melena, or hematochezia.   PAST MEDICAL HISTORY:  1. Coronary artery disease.  2. He is status post MI x2.  3. He has a coronary stent in place.  He has had no recent chest pain.   FAMILY HISTORY:  Pertinent for father with heart disease and prostate  cancer.   MEDICATIONS:  Baby aspirin, Crestor, Lisinopril, metoprolol, fluquazone, and  Claritin.   HE HAS NO ALLERGIES.   He neither smokes nor drinks.  He is married and works as an Scientist, product/process development.   REVIEW OF SYMPTOMS:  Positive for frequent cough.  This tends to occur in  the afternoon and is nonproductive.  He has used various ACE inhibitors but  the cough persists.   PHYSICAL EXAMINATION:  VITAL SIGNS:  Pulse 76, blood pressure 124/86, weight  182.  HEENT: EOMI. PERRLA. Sclerae are anicteric.  Conjunctivae are pink.  NECK:  Supple without thyromegaly, adenopathy or carotid bruits.  CHEST:  Clear to auscultation and percussion without adventitious sounds.  CARDIAC:  Regular rhythm; normal S1 S2.  There are no murmurs, gallops or  rubs.  ABDOMEN:  Bowel sounds are normoactive.  Abdomen is soft, non-tender and non-  distended.  There are no abdominal masses, tenderness, splenic enlargement  or hepatomegaly.  EXTREMITIES:  Full range of motion.  No cyanosis, clubbing or edema.  RECTAL:  Deferred.   IMPRESSION:  1. Coronary artery disease.  2. Chronic cough.  I do  not think this is related to reflux.   RECOMMENDATION:  Screening colonoscopy.                                   Barbette Hair. [name], MD,FACG   RDK/MedQ  DD:  05/28/2006  DT:  05/28/2006  Job #:  [id number]   cc:   [name], MD  Jesse Sans. Wall, MD, [facility]

## 2011-02-08 NOTE — Discharge Summary (Signed)
[place]  Patient:    Bradley May, Bradley May                    MRN: [id number] Adm. Date:  [id number] Disc. Date: [id number] Attending:  Mirian May Dictator:   Bradley May, M.D. CC:         Bradley May, M.D.  Bradley May, M.D.   Discharge Summary  DISCHARGE DIAGNOSES: 1. Acute posterior/inferior myocardial infarction. 2. Status post cardiac catheterization and stenting of distal right coronary    artery (TIMI-0 to TIMI-2 flow) with residual 80% stenosis of the mid distal    circumflex artery. 3. Status post Mallory-Weiss tear visualized by esophagogastroduodenoscopy. 4. Hypertension. 5. Hyperlipidemia.  DISCHARGE MEDICATIONS: 1. Plavix 75 mg p.o. q.d. x 4 weeks. 2. Lopressor 25 mg one half tablet p.o. b.i.d. 3. Aspirin 81 mg p.o. q.d. 4. Lipitor 10 mg one p.o. q.h.s.  PROCEDURES: 1. Jan 26, 2001, cardiac catheterization and stenting of the distal right    coronary artery.  Angiographic data revealed a totally occluded right    coronary artery distally.  Following reperusion and stenting, this distal    stenosis was reduced to 0% (TIMI-0 to TIMI-3 flow).  The circumflex had a    75% stenosis prior to the origin of the first marginal.  The first marginal    had approximately 30% narrowing.  There was 80% narrowing of the AV    circumflex beyond the first marginal leading into the second marginal.  The    diagonal off of the LAD had approximately 40-50% stenosis.  The LAD proper    had about 50% stenosis after the septal perforator.  The LAD proper was a    thin-caliber vessel after the original of the large diagonal.    Ventriculography revealed an ejection fraction of approximately 55% with    inferobasal hypokinesis to near akinesis. 2. Jan 27, 2001, esophagogastroduodenoscopy with injection of epinephrine.  The    esophagogastroduodenoscopy revealed bleeding from a Mallory-Weiss tear at    the level of the  diaphragm.  CONSULTATIONS:  Bradley May, M.D., gastroenterology.  FOLLOW-UP:  Bradley May has a follow-up appointment with Bradley May, M.D., on Thursday, Feb 05, 2001, at 10:15 a.m.  At this time, he will need to be reevaluated for a possible repeat catheterization to evaluate the lesions in the circumflex artery.  It was decided at the time of discharge that this would be done on an elective basis secondary to his Mallory-Weiss tear.  The cardiac rehabilitation program here at [facility]. [facility] is to contact Bradley May at the beginning of the coming week to set up plans for cardiac rehabilitation.  HISTORY OF PRESENT ILLNESS:  Bradley May is a 60 year old white male with a history of coronary artery disease, status post left circumflex infarct in April of 1992 with catheterization and subsequently PTCA revealing no restenosis.  He was transferred to [facility] from [facility] [place] secondary to chest pain that had begun at approximately 6:30 p.m. on the evening of admission.  EKG at [facility] showed ST elevation in leads 2, 3, and aVF with increased R wave in V2 and V3.  ST segment depression was noted in leads 1, aVL, V1, and V2.  He was treated with aspirin, IV nitroglycerin, and unfractionated heparin and transferred for primary PTCA and stenting.  PHYSICAL EXAMINATION:  Blood pressure 127/67,  pulse 80 and regular, oxygen saturation 98%.  NECK:  No JVD.  Carotid upstrokes equal bilaterally without bruits.  No thyromegaly.  LUNGS:  Clear to auscultation bilaterally.  CARDIAC:  Regular rate and rhythm with no gallops or rubs.  A 2/6 systolic murmur at the apex.  ABDOMEN:  Soft, flat, and nontender with positive bowel sounds.  No hepatosplenomegaly.  EXTREMITIES:  No clubbing, cyanosis, or edema.  Pulses were 3+-4+, including femorals, without bruits.  No  edema distally.  ADMISSION LABORATORY DATA:  Data from [facility] showed creatinine 1.1, potassium 3.5, hemoglobin 14, platelets 239,000, and normal PT and PTT.  HOSPITAL COURSE:  Upon his arrival of [place], Bradley May was taken to the cardiac catheterization lab by Bradley Fus D. Riley May, M.D., with results as noted above.  The patient tolerated the procedure well.  He did, however, develop a hematoma at the sheath site and the right groin area.  Manual pressure was applied with immediate resolution of the hematoma and no further swelling above the site noted.  Several hours following the procedure, the patient developed a large amount of fluid, maroon in color, with clots noted.  His hematemesis occurred an additional two times and a gastroenterology consult was obtained with Bradley May, M.D.  An EGD was performed which revealed a Mallory-Weiss tear.  This was injected with epinephrine and Bradley May had no further episodes of hematemesis.  His hemoglobin and hematocrit remained stable and he required no transfusions secondary to the Mallory-Weiss tear.  Cardiovascularly he remained stable following the procedure.  He was placed on an aspirin a day, Plavix for four weeks, and Lopressor 12.5 mg p.o. b.i.d. in addition to Lipitor 10 mg p.o. q.h.s.  He will be discharged on these medications.  An ACE inhibitor was deferred at the time of discharge secondary to mild hypotension.  His blood pressure at the time of discharge was 104/56.  DISCHARGE LABORATORY DATA:  White blood cell count 6.8, hemoglobin 10.9, platelets 165.  Sodium 143, potassium 3.6, chloride 108, bicarbonate 31, BUN 13, creatinine 0.9, glucose 90, total cholesterol 159, HDL 31, LDL 103 DD:  01/31/99 TD:  02/02/01 Job: [id number]

## 2011-02-08 NOTE — Assessment & Plan Note (Signed)
[facility]                                   ON-CALL NOTE   ISSAAC, SHIPPER                      MRN:          [id number]  DATE:05/03/2006                            DOB:          [birth year]    PHONE NUMBER:  [phone]   The wife calls at 9 o'clock on May 03, 2006.  Bradley May is a cardiac  patient of mine who has done well. He has just come down with fever, aches  and chills and his wife wants to have further evaluation.   PLAN:  I gave him an appointment at 11:15 a.m.                                   [name], MD   RIL/MedQ  DD:  05/03/2006  DT:  05/03/2006  Job #:  [id number]

## 2011-02-08 NOTE — Procedures (Signed)
[place]  Patient:    Bradley May, Bradley May                    MRN: [id number] Proc. Date: 01/27/01 Adm. Date:  [id number] Attending:  Mirian Mo CC:         Jesse Sans. Wall, M.D. [facility]   Procedure Report  PROCEDURE:  Esophagogastroduodenoscopy with injection of epinephrine.  INDICATION FOR PROCEDURE:  Hematemesis.  DESCRIPTION OF PROCEDURE:  The patient was placed in the left lateral decubitus position and placed on the pulse monitor with continuous low-flow oxygen delivered by nasal cannula.  He was sedated with 50 mg IV Demerol and 5 mg IV Versed.  The Olympus video endoscope was advanced under direct vision into the oropharynx and esophagus.  The esophagus was straight and of normal caliber with the squamocolumnar line at 38 cm.  There was some blood clotted at the Z-line, and I decided to proceed with intubation of the stomach, where there was a large body of old blood and clot seen in the dependent portions of the fundus and body.  I bypassed this area and was able to inspect the antrum as well as the lesser curve, the body and pylorus and duodenum, and all appeared normal and free of ulcers or other bleeding lesions.  A considerable amount of time was then spent lavaging the stomach and suctioning the bloody fluid as best as could be accomplished; however, I still was unable to completely clear this area to inspect the most dependent portions.  I withdrew the scope back into the GE junction area, and there appeared to be a Mallory-Weiss tear with an overlying clot that had some blood occasionally dripping from it into the stomach.  On closer inspection, it appeared to me that this tear was not at the squamocolumnar line but at the level of the diaphragm, and the patient appeared to have about a 2 cm hiatal hernia.  The area of the base of the blood clot where the presume tear was, was injected with a total of 3 cc of 1:10,000  epinephrine.  It was not clear if the bleeding actually ceased at that time, but the distal esophagus was further inspected and no other abnormalities were seen at the squamocolumnar line itself, and there were no esophageal varices.  No ulcer was seen in the stomach.  The scope was then withdrawn and the patient returned to the recovery room in stable condition.  He tolerated the procedure well, and There were no immediate complications.  IMPRESSION:  Apparent bleeding from Mallory-Weiss tear at the level of the distal margin of the hiatal hernia, injected with epinephrine.  PLAN:  IV Protonix and Carafate oral suspension.  Will cautiously continue anticoagulation as required for his recent MI and stenting, while assessing for cessation of bleeding. DD:  01/27/01 TD:  01/27/01 Job: [id number]

## 2011-05-09 ENCOUNTER — Encounter: Payer: Self-pay | Admitting: Cardiology

## 2011-05-09 ENCOUNTER — Ambulatory Visit (INDEPENDENT_AMBULATORY_CARE_PROVIDER_SITE_OTHER): Payer: 59 | Admitting: Cardiology

## 2011-05-09 VITALS — BP 112/76 | HR 59 | Ht 72.0 in | Wt 183.8 lb

## 2011-05-09 DIAGNOSIS — E785 Hyperlipidemia, unspecified: Secondary | ICD-10-CM

## 2011-05-09 DIAGNOSIS — I2510 Atherosclerotic heart disease of native coronary artery without angina pectoris: Secondary | ICD-10-CM

## 2011-05-09 NOTE — Assessment & Plan Note (Signed)
Stable. Continue medical therapy followup in one year

## 2011-05-09 NOTE — Patient Instructions (Signed)
Your physician recommends that you schedule a follow-up appointment in: 1 year with Dr. Wall  

## 2011-05-09 NOTE — Progress Notes (Signed)
HPI Bradley May comes in today for the evaluation and management of his coronary artery disease. He is having no angina or ischemic equivalence other than some mild dyspnea on exertion from his pulmonary issues.  Laboratory data is being followed by Dr.Letvak. Values reviewed. He is on maximum dose Crestor. Does have problems other statins in the past.  EKG today shows normal sinus rhythm with an old inferior posterior infarct. No acute changes.  Medications reviewed. He is compliant. Past Medical History  Diagnosis Date  . CAD (coronary artery disease)     Dr. [name]  . BPH (benign prostatic hypertrophy)     Dr. [name]  . Interstitial lung disease     Dr. [name]  . Allergic rhinitis     Past Surgical History  Procedure Date  . Knee surgery 1978    Right  . Angioplasty 1992  . Coronary angioplasty with stent placement 01/2001    RCA  . Esophagogastroduodenoscopy 01/2001    bleeding  . Ureteral stent placement 01/2009    Dr. [name]    Family History  Problem Relation Age of Onset  . Coronary artery disease Father   . Cancer Father     prostate  . Diabetes Father   . Hypertension Father   . Hypertension Mother     History   Social History  . Marital Status: Married    Spouse Name: N/A    Number of Children: 1  . Years of Education: N/A   Occupational History  . Risk analyst    Social History Main Topics  . Smoking status: Former Smoker    Quit date: 09/23/1990  . Smokeless tobacco: Not on file  . Alcohol Use: No  . Drug Use: Not on file  . Sexually Active: Not on file   Other Topics Concern  . Not on file   Social History Narrative  . No narrative on file    Allergies  Allergen Reactions  . Atorvastatin     REACTION: Headache  . Doxycycline     REACTION: Nausea    Current Outpatient Prescriptions  Medication Sig Dispense Refill  . aspirin 81 MG tablet Take 81 mg by mouth daily.        . CRESTOR 40 MG tablet TAKE 1 TABLET EVERY DAY  30  tablet  2  . DIOVAN 160 MG tablet TAKE ONE-HALF TABLET DAILY  15 tablet  11  . fluticasone (FLONASE) 50 MCG/ACT nasal spray USE TWO PUFFS INTO EACH NOSTRIL DAILY  16 g  2  . metoprolol succinate (TOPROL-XL) 25 MG 24 hr tablet TAKE 1 TABLET EVERY DAY  30 tablet  11  . nitroGLYCERIN (NITROSTAT) 0.4 MG SL tablet Place 0.4 mg under the tongue every 5 (five) minutes as needed. For chest pain. May repeat 3 times       . silodosin (RAPAFLO) 8 MG CAPS capsule Take 8 mg by mouth as needed.          ROS Negative other than HPI.   PE General Appearance: well developed, well nourished in no acute distress HEENT: symmetrical face, PERRLA, good dentition  Neck: no JVD, thyromegaly, or adenopathy, trachea midline Chest: symmetric without deformity Cardiac: PMI non-displaced, RRR, normal S1, S2, no gallop or murmur Lung: clear to ausculation and percussion, by basilar crackles Vascular: all pulses full without bruits  Abdominal: nondistended, nontender, good bowel sounds, no HSM, no bruits Extremities: no cyanosis, clubbing or edema, no sign of DVT, no varicosities  Skin: normal color,  no rashes Neuro: alert and oriented x 3, non-focal Pysch: normal affect Filed Vitals:   05/09/11 0933  BP: 198/80  Pulse: 59  Height: 6' (1.829 m)  Weight: 183 lb 12.8 oz (83.371 kg)    EKG  Labs and Studies Reviewed.   Lab Results  Component Value Date   WBC 7.4 01/29/2011   HGB 14.4 01/29/2011   HCT 42.8 01/29/2011   MCV 94.4 01/29/2011   PLT 254.0 01/29/2011      Chemistry      Component Value Date/Time   NA 140 01/29/2011 1251   K 5.3* 01/29/2011 1251   CL 104 01/29/2011 1251   CO2 29 01/29/2011 1251   BUN 12 01/29/2011 1251   CREATININE 1.0 01/29/2011 1251      Component Value Date/Time   CALCIUM 9.7 01/29/2011 1251   ALKPHOS 34* 01/29/2011 1251   AST 22 01/29/2011 1251   ALT 19 01/29/2011 1251   BILITOT 0.9 01/29/2011 1251       Lab Results  Component Value Date   CHOL 163 01/29/2011   CHOL 140 11/21/2009   CHOL  158 06/07/2009   Lab Results  Component Value Date   HDL 37.70* 01/29/2011   HDL 16.10 11/21/2009   HDL 45.20 06/07/2009   Lab Results  Component Value Date   LDLCALC 92 01/29/2011   LDLCALC 70 11/21/2009   LDLCALC 97 06/07/2009   Lab Results  Component Value Date   TRIG 166.0* 01/29/2011   TRIG 92.0 11/21/2009   TRIG 77.0 06/07/2009   Lab Results  Component Value Date   CHOLHDL 4 01/29/2011   CHOLHDL 3 11/21/2009   CHOLHDL 3 06/07/2009   No results found for this basename: HGBA1C   Lab Results  Component Value Date   ALT 19 01/29/2011   AST 22 01/29/2011   ALKPHOS 34* 01/29/2011   BILITOT 0.9 01/29/2011   Lab Results  Component Value Date   TSH 0.60 01/29/2011

## 2011-05-20 ENCOUNTER — Other Ambulatory Visit: Payer: Self-pay | Admitting: Internal Medicine

## 2011-06-05 ENCOUNTER — Ambulatory Visit (INDEPENDENT_AMBULATORY_CARE_PROVIDER_SITE_OTHER)
Admission: RE | Admit: 2011-06-05 | Discharge: 2011-06-05 | Disposition: A | Discharge status: Home / Self Care | Payer: 59 | Source: Ambulatory Visit | Attending: Pulmonary Disease | Admitting: Pulmonary Disease

## 2011-06-05 ENCOUNTER — Other Ambulatory Visit (INDEPENDENT_AMBULATORY_CARE_PROVIDER_SITE_OTHER): Payer: 59

## 2011-06-05 ENCOUNTER — Encounter: Payer: Self-pay | Admitting: Pulmonary Disease

## 2011-06-05 ENCOUNTER — Ambulatory Visit (INDEPENDENT_AMBULATORY_CARE_PROVIDER_SITE_OTHER): Payer: 59 | Admitting: Pulmonary Disease

## 2011-06-05 VITALS — BP 122/64 | HR 61 | Temp 97.6°F | Ht 72.0 in | Wt 186.0 lb

## 2011-06-05 DIAGNOSIS — J8410 Pulmonary fibrosis, unspecified: Secondary | ICD-10-CM

## 2011-06-05 DIAGNOSIS — J849 Interstitial pulmonary disease, unspecified: Secondary | ICD-10-CM

## 2011-06-05 DIAGNOSIS — J84115 Respiratory bronchiolitis interstitial lung disease: Secondary | ICD-10-CM

## 2011-06-05 DIAGNOSIS — Z23 Encounter for immunization: Secondary | ICD-10-CM

## 2011-06-05 LAB — SEDIMENTATION RATE: Sed Rate: 18 mm/hr (ref 0–22)

## 2011-06-05 NOTE — Assessment & Plan Note (Addendum)
This most likely represents idiopathic pulmonary fibrosis, but we'll check an autoimmune panel today for completeness.  If this is IPF, I really would not change her current plan of surveillance since he is still quite functional and has not had deteriorating PFTs.  He is not interested in the ascend trial currently.  We'll see him back in 6 months with  followup PFTs.

## 2011-06-05 NOTE — Patient Instructions (Signed)
Will check bloodwork today to evaluate for autoimmune disease.  Will call you with results. Would recommend flu shot this year. followup with me in 6mos, with pfts the same day for review Please call if worsening breathing before next visit.

## 2011-06-05 NOTE — Progress Notes (Signed)
  Subjective:    Patient ID: Bradley May, male    DOB: Dec 25, 1950, 60 y.o.   MRN: [id number]  HPI The patient comes in today for followup of his known interstitial lung disease.  This is felt most likely due to idiopathic pulmonary fibrosis.  Since the last visit, he feels that his exertional tolerance is completely stable, and denies any significant cough or congestion.  He has had a chest x-ray today that shows no change from prior.   Review of Systems  Constitutional: Negative for fever and unexpected weight change.  HENT: Negative for ear pain, nosebleeds, congestion, sore throat, rhinorrhea, sneezing, trouble swallowing, dental problem, postnasal drip and sinus pressure.   Eyes: Negative for redness and itching.  Respiratory: Positive for cough. Negative for chest tightness, shortness of breath and wheezing.   Cardiovascular: Negative for palpitations and leg swelling.  Gastrointestinal: Negative for nausea and vomiting.  Genitourinary: Negative for dysuria.  Musculoskeletal: Negative for joint swelling.  Skin: Negative for rash.  Neurological: Negative for headaches.  Hematological: Does not bruise/bleed easily.  Psychiatric/Behavioral: Negative for dysphoric mood. The patient is not nervous/anxious.        Objective:   Physical Exam Well-developed male in no acute distress Nose without purulence or discharge noted Chest with crackles about one third of the way up bilaterally Cardiac exam with regular rate and rhythm Lower extremities without edema, pulses intact distally, no cyanosis Alert and oriented, moves all 4 extremities.       Assessment & Plan:

## 2011-06-06 LAB — ANA: Anti Nuclear Antibody(ANA): NEGATIVE

## 2011-06-06 LAB — ANTI-DNA ANTIBODY, DOUBLE-STRANDED: ds DNA Ab: 1 IU/mL (ref <30)

## 2011-06-06 LAB — RHEUMATOID FACTOR: Rhuematoid fact SerPl-aCnc: <10 IU/mL (ref <=14)

## 2011-08-19 ENCOUNTER — Other Ambulatory Visit: Payer: Self-pay | Admitting: Internal Medicine

## 2011-12-03 ENCOUNTER — Encounter: Payer: Self-pay | Admitting: Pulmonary Disease

## 2011-12-03 ENCOUNTER — Ambulatory Visit (INDEPENDENT_AMBULATORY_CARE_PROVIDER_SITE_OTHER): Payer: 59 | Admitting: Pulmonary Disease

## 2011-12-03 DIAGNOSIS — J8410 Pulmonary fibrosis, unspecified: Secondary | ICD-10-CM

## 2011-12-03 DIAGNOSIS — R05 Cough: Secondary | ICD-10-CM

## 2011-12-03 LAB — PULMONARY FUNCTION TEST

## 2011-12-03 NOTE — Assessment & Plan Note (Signed)
The patient has known interstitial lung disease that is most consistent with IPF.  He has been stable from a functional standpoint, and also with respect to PFTs and chest x-ray.  His DLCO shows very little change on today's PFTs, but unfortunately he was unable to do lung volumes today because of coughing.  His forced vital capacity shows a mild decrease from his studies a year ago.  I would like to schedule him for lung volumes once his cough is improved, and if stable, he is in agreement to continue with surveillance of his lung disease.  He understands there is very little treatment for IPF, and he has not been interested in investigational trials to date.

## 2011-12-03 NOTE — Assessment & Plan Note (Signed)
By his description, the cough is more than likely due to an upper airway issue rather than lower.  He describes a classic globus sensation, and is having a lot of PND.  Unfortunately, he has prostate issues, and cannot take more potent antihistamines.  I also wonder if LPR may be playing a role here as well.  I do not think his cough is due to his ISLD at this time.

## 2011-12-03 NOTE — Progress Notes (Signed)
PFT done today. 

## 2011-12-03 NOTE — Patient Instructions (Signed)
Stop current nasal spray.  In its place try dymista 2 sprays each nostril each am  dexilant 60mg  one each night at bedtime to treat possible reflux that may be causing cough Please call and give me feedback on how cough is doing in a few weeks. Will reschedule lung volumes only at your convenience. If doing well, followup with me in 6mos, and will check cxr at that visit.

## 2011-12-03 NOTE — Progress Notes (Signed)
  Subjective:    Patient ID: Bradley May, male    DOB: 1950/10/25, 61 y.o.   MRN: [id number]  HPI The patient comes in today for followup of his known interstitial lung disease, felt secondary to idiopathic pulmonary fibrosis.  He has had fairly stable pulmonary function studies and x-rays, and has not seen any worsening of his exertional tolerance since the last visit.  He has developed a cough starting in February that is clearly upper airway by his description.  He is complaining of a significant globus sensation, and feels that sometimes blocks his airway.  He is having significant postnasal drip despite nasal corticosteroids.  He denies any history of reflux, but admits the cough is worse at night on lying down.   Review of Systems  Constitutional: Negative for fever and unexpected weight change.  HENT: Negative for ear pain, nosebleeds, congestion, sore throat, rhinorrhea, sneezing, trouble swallowing, dental problem, postnasal drip and sinus pressure.   Eyes: Negative for redness and itching.  Respiratory: Positive for cough, shortness of breath and wheezing. Negative for chest tightness.   Cardiovascular: Negative for palpitations and leg swelling.  Gastrointestinal: Negative for nausea and vomiting.  Genitourinary: Negative for dysuria.  Musculoskeletal: Negative for joint swelling.  Skin: Negative for rash.  Neurological: Negative for headaches.  Hematological: Does not bruise/bleed easily.  Psychiatric/Behavioral: Negative for dysphoric mood. The patient is not nervous/anxious.        Objective:   Physical Exam Well-developed male in no acute distress Nose without purulence or discharge noted Oropharynx with no lesions or exudates, but obvious postnasal drip visualized Chest with crackles one third of the way up bilaterally, no wheezes or rhonchi Cardiac exam with regular rate and rhythm Lower extremities without edema, no cyanosis Alert and oriented, moves all 4  extremities.       Assessment & Plan:

## 2011-12-09 ENCOUNTER — Encounter: Payer: Self-pay | Admitting: Pulmonary Disease

## 2011-12-25 ENCOUNTER — Telehealth: Payer: Self-pay | Admitting: Pulmonary Disease

## 2011-12-25 NOTE — Telephone Encounter (Signed)
Spoke with pt. He states calling to give an update on how cough is doing. Pt states that his cough is the exact same since ov 12/03/11- no better, but no worse. He states that he finished the samples of dymista and also dexilant. States no new complaints. Wants to know what the next step is. Has not rescheduled lung volumes yet. Please advise, thanks!

## 2011-12-25 NOTE — Telephone Encounter (Signed)
Did the nasal spray stop his postnasal drip? We need to stop this before we can say that it is not the cause of his cough.  If still having drip, have him stop current nasal sprays, and call in atrovent nasal spray 0.06% 2 each nostril am and pm.  Give [place] feedback in 1-2 weeks.

## 2011-12-25 NOTE — Telephone Encounter (Signed)
lmomtcb x1 on cell since no answer at work #

## 2011-12-26 MED ORDER — IPRATROPIUM BROMIDE 0.06 % NA SOLN: NASAL | Status: DC

## 2011-12-26 NOTE — Telephone Encounter (Signed)
Returning call can be reached at [phone].Bradley May

## 2011-12-26 NOTE — Telephone Encounter (Signed)
I spoke with pt and states the PND did not completely stop. He is aware to stop his current nasal spray and is aware of the directions for the atrovent. Rx has been called into the pharmacy. Pt will call back in 1-2 weeks w/ feedback.

## 2012-01-13 ENCOUNTER — Telehealth: Payer: Self-pay | Admitting: Pulmonary Disease

## 2012-01-13 NOTE — Telephone Encounter (Signed)
Called, spoke with pt who states he is calling to give an update on how cough is doing per recs from phone msg on 12/25/11.  Pt states cough has not improved - it is no better or worse.  States he is still having PND.  Reports the atrovent nasal spray caused nose bleeds, so he stopped this medication.  Requesting recs on what to do now.  Dr. [name], pls advise. Thank you.

## 2012-01-14 NOTE — Telephone Encounter (Signed)
Pt is scheduled to come in on Monday 01/20/12 at 4:30

## 2012-01-14 NOTE — Telephone Encounter (Signed)
See if pt will come in and see me to discuss cough and further treatment.

## 2012-01-20 ENCOUNTER — Encounter: Payer: Self-pay | Admitting: Pulmonary Disease

## 2012-01-20 ENCOUNTER — Ambulatory Visit (INDEPENDENT_AMBULATORY_CARE_PROVIDER_SITE_OTHER): Payer: 59 | Admitting: Pulmonary Disease

## 2012-01-20 VITALS — BP 120/80 | HR 86 | Temp 98.1°F | Ht 72.0 in | Wt 190.6 lb

## 2012-01-20 DIAGNOSIS — R05 Cough: Secondary | ICD-10-CM

## 2012-01-20 MED ORDER — MONTELUKAST SODIUM 10 MG PO TABS: 10.0000 mg | ORAL_TABLET | Freq: Every day | ORAL | Status: DC

## 2012-01-20 MED ORDER — PREDNISONE 10 MG PO TABS: ORAL_TABLET | ORAL | Status: DC

## 2012-01-20 NOTE — Progress Notes (Signed)
  Subjective:    Patient ID: Bradley May, male    DOB: 1951-07-18, 61 y.o.   MRN: [id number]  HPI The patient comes in today for followup of his chronic cough.  This is felt to be upper airway in origin, and most likely due to postnasal drip.  He has been treated with numerous medications without improvement.  He has also been treated for reflux disease without change.  The patient has underlying interstitial lung disease, but his history is most consistent with an upper airway source for his cough.  He has not had an upper airway exam by otolaryngology.   Review of Systems  Constitutional: Negative for fever and unexpected weight change.  HENT: Positive for sneezing and postnasal drip. Negative for ear pain, nosebleeds, congestion, sore throat, rhinorrhea, trouble swallowing, dental problem and sinus pressure.   Eyes: Positive for redness and itching.  Respiratory: Positive for cough. Negative for chest tightness, shortness of breath and wheezing.   Cardiovascular: Negative for palpitations and leg swelling.  Gastrointestinal: Negative for nausea and vomiting.  Genitourinary: Negative for dysuria.  Musculoskeletal: Negative for joint swelling.  Skin: Negative for rash.  Neurological: Negative for headaches.  Hematological: Does not bruise/bleed easily.  Psychiatric/Behavioral: Negative for dysphoric mood. The patient is not nervous/anxious.        Objective:   Physical Exam Thin male in no acute distress Nose without purulence or discharge noted Chest with crackles one third the way up bilaterally, no wheezing Cardiac exam with regular rate and rhythm Lower extremities without edema, no cyanosis Alert and oriented, moves all 4 extremities.       Assessment & Plan:

## 2012-01-20 NOTE — Patient Instructions (Signed)
Will treat with a 2 week course of prednisone, along with singulair each day to see if cough improves.  Please call me with your response after 2-3 weeks.

## 2012-01-20 NOTE — Assessment & Plan Note (Addendum)
His cough continues to sound more upper airway in origin, and related to possible postnasal drip.  He did not respond as expected to various nasal sprays, and unfortunately is not able to take the stronger oral antihistamines because of prostate issues.  It is unclear if this is allergic versus nonallergic rhinitis.  At this point, I would like to try him on a course of prednisone along with Singulair to see if things improve.  If they do not, I would have ENT do an upper airway exam.  If this is normal, we will have to discuss whether this may be related to his interstitial lung disease, and require lung biopsy with consideration of more aggressive treatment??

## 2012-01-30 ENCOUNTER — Other Ambulatory Visit: Payer: Self-pay | Admitting: Internal Medicine

## 2012-01-31 ENCOUNTER — Encounter: Payer: Self-pay | Admitting: Internal Medicine

## 2012-01-31 ENCOUNTER — Ambulatory Visit (INDEPENDENT_AMBULATORY_CARE_PROVIDER_SITE_OTHER): Payer: 59 | Admitting: Internal Medicine

## 2012-01-31 VITALS — BP 110/60 | HR 67 | Temp 98.0°F | Ht 72.0 in | Wt 184.0 lb

## 2012-01-31 DIAGNOSIS — E785 Hyperlipidemia, unspecified: Secondary | ICD-10-CM

## 2012-01-31 DIAGNOSIS — I2510 Atherosclerotic heart disease of native coronary artery without angina pectoris: Secondary | ICD-10-CM

## 2012-01-31 DIAGNOSIS — Z0000 Encounter for general adult medical examination without abnormal findings: Secondary | ICD-10-CM

## 2012-01-31 DIAGNOSIS — N400 Enlarged prostate without lower urinary tract symptoms: Secondary | ICD-10-CM

## 2012-01-31 DIAGNOSIS — L570 Actinic keratosis: Secondary | ICD-10-CM

## 2012-01-31 LAB — CBC WITH DIFFERENTIAL/PLATELET
Basophils Absolute: 0 10*3/uL (ref 0.0–0.1)
HCT: 45.5 % (ref 39.0–52.0)
Lymphs Abs: 1.5 10*3/uL (ref 0.7–4.0)
MCHC: 33 g/dL (ref 30.0–36.0)
MCV: 94.7 fl (ref 78.0–100.0)
Monocytes Absolute: 0.8 10*3/uL (ref 0.1–1.0)
Platelets: 183 10*3/uL (ref 150.0–400.0)
RDW: 13.1 % (ref 11.5–14.6)

## 2012-01-31 LAB — HEPATIC FUNCTION PANEL: Albumin: 3.9 g/dL (ref 3.5–5.2)

## 2012-01-31 LAB — BASIC METABOLIC PANEL
BUN: 14 mg/dL (ref 6–23)
Chloride: 103 mEq/L (ref 96–112)
Glucose, Bld: 97 mg/dL (ref 70–99)
Potassium: 4.1 mEq/L (ref 3.5–5.1)

## 2012-01-31 LAB — TSH: TSH: 0.89 u[IU]/mL (ref 0.35–5.50)

## 2012-01-31 LAB — LIPID PANEL
Cholesterol: 151 mg/dL (ref 0–200)
VLDL: 26.8 mg/dL (ref 0.0–40.0)

## 2012-01-31 NOTE — Assessment & Plan Note (Signed)
Doing well Has improved fitness and feels well except for cough PSA per Dr [name] He will check into coverage for zostavax

## 2012-01-31 NOTE — Assessment & Plan Note (Signed)
Voids okay on the med 

## 2012-01-31 NOTE — Assessment & Plan Note (Signed)
No problems with med Due for labs 

## 2012-01-31 NOTE — Patient Instructions (Signed)
Please check with your insurance to see if it covers the zostavax (shingles vaccine)

## 2012-01-31 NOTE — Progress Notes (Signed)
Subjective:    Patient ID: Bradley May, male    DOB: 01-Jun-1951, 61 y.o.   MRN: [id number]  HPI Doing fairly well Still concerned about cough--Dr Clance's note and plan reviewed  Continues to exercise---walk and steps Stamina is stable  Still sees Dr [name] Will check PSA Voids okay on the rapaflo  Several skin areas he wants checked  Current Outpatient Prescriptions on File Prior to Visit  Medication Sig Dispense Refill  . aspirin 81 MG tablet Take 81 mg by mouth daily.        . CRESTOR 40 MG tablet TAKE 1 TABLET EVERY DAY  30 tablet  11  . DIOVAN 160 MG tablet TAKE ONE-HALF TABLET DAILY  15 tablet  11  . fluticasone (FLONASE) 50 MCG/ACT nasal spray USE TWO PUFFS INTO EACH NOSTRIL DAILY  16 g  2  . metoprolol succinate (TOPROL-XL) 25 MG 24 hr tablet TAKE 1 TABLET EVERY DAY  30 tablet  0  . montelukast (SINGULAIR) 10 MG tablet Take 1 tablet (10 mg total) by mouth at bedtime.  30 tablet  0  . nitroGLYCERIN (NITROSTAT) 0.4 MG SL tablet Place 0.4 mg under the tongue every 5 (five) minutes as needed. For chest pain. May repeat 3 times       . predniSONE (DELTASONE) 10 MG tablet Take 4 tabs daily x 2 days, then 2 tabs daily x 2 days, then 1 tab daily x 10 days, then stop  22 tablet  0  . silodosin (RAPAFLO) 8 MG CAPS capsule Take 8 mg by mouth as needed.          Allergies  Allergen Reactions  . Atorvastatin     REACTION: Headache  . Doxycycline     REACTION: Nausea    Past Medical History  Diagnosis Date  . CAD (coronary artery disease)     Dr. [name]  . BPH (benign prostatic hypertrophy)     Dr. [name]  . Interstitial lung disease     Dr. [name]  . Allergic rhinitis     Past Surgical History  Procedure Date  . Knee surgery 1978    Right  . Angioplasty 1992  . Coronary angioplasty with stent placement 01/2001    RCA  . Esophagogastroduodenoscopy 01/2001    bleeding  . Ureteral stent placement 01/2009    Dr. [name]    Family History  Problem Relation Age of Onset    . Coronary artery disease Father   . Cancer Father     prostate  . Diabetes Father   . Hypertension Father   . Hypertension Mother     History   Social History  . Marital Status: Married    Spouse Name: N/A    Number of Children: 1  . Years of Education: N/A   Occupational History  . Risk analyst    Social History Main Topics  . Smoking status: Former Smoker -- 1.0 packs/day for 25 years    Types: Cigarettes    Quit date: 09/23/1990  . Smokeless tobacco: Never Used  . Alcohol Use: No  . Drug Use: Not on file  . Sexually Active: Not on file   Other Topics Concern  . Not on file   Social History Narrative  . No narrative on file   Review of Systems  Constitutional: Negative for fatigue and unexpected weight change.       Weight down some Wears seat belt  HENT: Positive for congestion and rhinorrhea. Negative for hearing loss,  dental problem and tinnitus.        Unsure if postnasal drip contributes to cough  Eyes: Negative for visual disturbance.       No diplopia or unilateral vision loss  Respiratory: Positive for cough and shortness of breath. Negative for chest tightness.        Chronic cough Vague SOB at times but not exertional   Cardiovascular: Negative for chest pain, palpitations and leg swelling.  Gastrointestinal: Negative for nausea, vomiting, abdominal pain, constipation and blood in stool.       No heartburn  Genitourinary: Negative for urgency, frequency and difficulty urinating.       No sex --no problem  Musculoskeletal: Negative for back pain, joint swelling and arthralgias.  Skin: Negative for rash.       Wants some spots checked  Neurological: Negative for dizziness, syncope, weakness, light-headedness, numbness and headaches.  Hematological: Negative for adenopathy. Does not bruise/bleed easily.  Psychiatric/Behavioral: Negative for sleep disturbance and dysphoric mood. The patient is not nervous/anxious.        Objective:    Physical Exam  Constitutional: He is oriented to person, place, and time. He appears well-developed and well-nourished. No distress.  HENT:  Head: Normocephalic and atraumatic.  Right Ear: External ear normal.  Left Ear: External ear normal.  Mouth/Throat: Oropharynx is clear and moist. No oropharyngeal exudate.  Eyes: Conjunctivae and EOM are normal. Pupils are equal, round, and reactive to light.  Neck: Normal range of motion. Neck supple. No thyromegaly present.  Cardiovascular: Normal rate, regular rhythm, normal heart sounds and intact distal pulses.  Exam reveals no gallop.   No murmur heard. Pulmonary/Chest: Effort normal. No respiratory distress. He has no wheezes.       Faint bibasilar crackles  Abdominal: Soft. There is no tenderness.  Musculoskeletal: Normal range of motion. He exhibits no edema and no tenderness.  Lymphadenopathy:    He has no cervical adenopathy.  Neurological: He is alert and oriented to person, place, and time.  Skin: No rash noted. No erythema.       Actinic on mid chest and left temple Small benign mole on mid right thoracic back  Psychiatric: He has a normal mood and affect. His behavior is normal. Thought content normal.          Assessment & Plan:

## 2012-01-31 NOTE — Assessment & Plan Note (Signed)
Seems to be quiet On appropriate Rx

## 2012-01-31 NOTE — Assessment & Plan Note (Signed)
Both actinics treated with liquid nitrogen for 40 seconds x 2 Tolerated well Home care discussed

## 2012-02-04 ENCOUNTER — Encounter: Payer: Self-pay | Admitting: *Deleted

## 2012-02-10 ENCOUNTER — Telehealth: Payer: Self-pay | Admitting: Pulmonary Disease

## 2012-02-10 NOTE — Telephone Encounter (Signed)
Per pt instructions from OV with KC on 01/20/12:   Patient Instructions     Will treat with a 2 week course of prednisone, along with singulair each day to see if cough improves. Please call me with your response after 2-3 weeks.    ----  Called, spoke with pt who reports cough is "better but not good."  Cough is occas prod with clear mucus.  Believes he could maybe do PFT but not sure.  Advised will forward msg to [facility] for further recs -- pls advise.  Thank you .

## 2012-02-14 NOTE — Telephone Encounter (Signed)
Have called pt multiple times this week with no answer on home/mobile.  Have LMOM for him to call us and let us know when is a good time to call him.  Await his call.

## 2012-02-18 ENCOUNTER — Other Ambulatory Visit: Payer: Self-pay | Admitting: Pulmonary Disease

## 2012-02-18 DIAGNOSIS — R05 Cough: Secondary | ICD-10-CM

## 2012-02-18 NOTE — Telephone Encounter (Signed)
Called and spoke with pt.  He is still having cough that he thinks is due to postnasal drip.  He has been treated with nasal steroids, nasal antihistamines, and atrovent nasal.  He most recently was put on a short trial of singulair and pred pulse.  He states cough minimally better, and still having PND> Will check ct sinuses.  If unremarkable, ?allergy consultation?

## 2012-02-18 NOTE — Telephone Encounter (Signed)
Pt stated that he can be reached at [phone], ext [phone] between 8am-5 pm any other time pt can be reached at [phone] (5-5:30 pm, cell phone reception is spotty).  [name]

## 2012-02-18 NOTE — Telephone Encounter (Signed)
LMTCB to see when he would be available to speak with [facility] and best number to reach him.

## 2012-02-20 ENCOUNTER — Ambulatory Visit (INDEPENDENT_AMBULATORY_CARE_PROVIDER_SITE_OTHER)
Admission: RE | Admit: 2012-02-20 | Discharge: 2012-02-20 | Disposition: A | Discharge status: Home / Self Care | Payer: 59 | Source: Ambulatory Visit | Attending: Pulmonary Disease | Admitting: Pulmonary Disease

## 2012-02-20 DIAGNOSIS — R05 Cough: Secondary | ICD-10-CM

## 2012-02-28 NOTE — Progress Notes (Signed)
Quick Note:  Spoke with pt and notified of results per Dr.Clance. Pt verbalized understanding and denied any questions. He states will think about seeing allergist and call back to let us know.   ______

## 2012-03-02 ENCOUNTER — Other Ambulatory Visit: Payer: Self-pay | Admitting: Internal Medicine

## 2012-03-18 ENCOUNTER — Other Ambulatory Visit: Payer: Self-pay | Admitting: Internal Medicine

## 2012-05-18 ENCOUNTER — Encounter: Payer: Self-pay | Admitting: Cardiology

## 2012-05-18 ENCOUNTER — Ambulatory Visit (INDEPENDENT_AMBULATORY_CARE_PROVIDER_SITE_OTHER): Payer: 59 | Admitting: Cardiology

## 2012-05-18 VITALS — BP 140/78 | HR 68 | Ht 72.0 in | Wt 179.0 lb

## 2012-05-18 DIAGNOSIS — R05 Cough: Secondary | ICD-10-CM

## 2012-05-18 DIAGNOSIS — J8410 Pulmonary fibrosis, unspecified: Secondary | ICD-10-CM

## 2012-05-18 DIAGNOSIS — I2510 Atherosclerotic heart disease of native coronary artery without angina pectoris: Secondary | ICD-10-CM

## 2012-05-18 DIAGNOSIS — E785 Hyperlipidemia, unspecified: Secondary | ICD-10-CM

## 2012-05-18 DIAGNOSIS — I252 Old myocardial infarction: Secondary | ICD-10-CM

## 2012-05-18 MED ORDER — VERAPAMIL HCL ER 180 MG PO TBCR: 180.0000 mg | EXTENDED_RELEASE_TABLET | Freq: Every day | ORAL | Status: DC

## 2012-05-18 NOTE — Assessment & Plan Note (Signed)
Stable. Continue aggressive secondary preventive therapy. With his wheezing and cough I have stopped his low dose beta blocker. Dose this helped his headaches dramatically in the past, I started him on low-dose verapamil SR 180 mg a day. If his headaches return, he will stop this and go back on his Toprol.

## 2012-05-18 NOTE — Patient Instructions (Addendum)
Your physician has recommended you make the following change in your medication: STOP your Metoprolol (Toprol) and start Verapamil SR 180mg  daily.  If your headaches come back, you can switch back to the Metoprolol and stop the Verapamil.  Your physician wants you to follow-up in: 1 year.   You will receive a reminder letter in the mail two months in advance. If you don't receive a letter, please call our office to schedule the follow-up appointment.

## 2012-05-18 NOTE — Progress Notes (Signed)
HPI [name] comes in today for evaluation and management of his coronary artery disease. He is having no angina or ischemic symptoms. He does have some dyspnea on exertion but this is from his interstitial lung disease. He has persistent problems with cough and also with some wheezing at night. He is on low-dose of Toprol which we started in the past. This also had a dramatic improvement in his headaches.  He is no longer on ACE inhibitor. He is on an ARB. He is followed by Dr. [name].   Recent blood work by primary care shows his lipids to be a goal.  Denies orthopnea, PND or edema.  Past Medical History  Diagnosis Date  . CAD (coronary artery disease)     Dr. [name]  . BPH (benign prostatic hypertrophy)     Dr. [name]  . Interstitial lung disease     Dr. [name]  . Allergic rhinitis     Current Outpatient Prescriptions  Medication Sig Dispense Refill  . aspirin 81 MG tablet Take 81 mg by mouth daily.        . CRESTOR 40 MG tablet TAKE 1 TABLET EVERY DAY  30 tablet  11  . DIOVAN 80 MG tablet TAKE 1 TABLET EVERY DAY  30 tablet  11  . fluticasone (FLONASE) 50 MCG/ACT nasal spray USE TWO PUFFS INTO EACH NOSTRIL DAILY  16 g  11  . metoprolol succinate (TOPROL-XL) 25 MG 24 hr tablet TAKE 1 TABLET EVERY DAY  30 tablet  11  . nitroGLYCERIN (NITROSTAT) 0.4 MG SL tablet Place 0.4 mg under the tongue every 5 (five) minutes as needed. For chest pain. May repeat 3 times       . silodosin (RAPAFLO) 8 MG CAPS capsule Take 8 mg by mouth as needed.        Marland Kitchen DISCONTD: DIOVAN 160 MG tablet TAKE ONE-HALF TABLET DAILY  15 tablet  11    Allergies  Allergen Reactions  . Atorvastatin     REACTION: Headache  . Doxycycline     REACTION: Nausea    Family History  Problem Relation Age of Onset  . Coronary artery disease Father   . Cancer Father     prostate  . Diabetes Father   . Hypertension Father   . Hypertension Mother     History   Social History  . Marital Status: Married    Spouse  Name: N/A    Number of Children: 1  . Years of Education: N/A   Occupational History  . Risk analyst    Social History Main Topics  . Smoking status: Former Smoker -- 1.0 packs/day for 25 years    Types: Cigarettes    Quit date: 09/23/1990  . Smokeless tobacco: Never Used  . Alcohol Use: No  . Drug Use: Not on file  . Sexually Active: Not on file   Other Topics Concern  . Not on file   Social History Narrative  . No narrative on file    ROS ALL NEGATIVE EXCEPT THOSE NOTED IN HPI  PE  General Appearance: well developed, well nourished in no acute distress HEENT: symmetrical face, PERRLA, good dentition  Neck: no JVD, thyromegaly, or adenopathy, trachea midline Chest: symmetric without deformity Cardiac: PMI non-displaced, RRR, normal S1, S2, no gallop or murmur Lung: clear to ausculation and percussion, basilar crackles with inspiration  Vascular: all pulses full without bruits  Abdominal: nondistended, nontender, good bowel sounds, no HSM, no bruits Extremities: no cyanosis, clubbing or  edema, no sign of DVT, no varicosities  Skin: normal color, no rashes Neuro: alert and oriented x 3, non-focal Pysch: normal affect  EKG normal sinus rhythm, no acute changes.  BMET    Component Value Date/Time   NA 140 01/31/2012 0937   K 4.1 01/31/2012 0937   CL 103 01/31/2012 0937   CO2 29 01/31/2012 0937   GLUCOSE 97 01/31/2012 0937   BUN 14 01/31/2012 0937   CREATININE 0.9 01/31/2012 0937   CALCIUM 9.3 01/31/2012 0937   GFRNONAA 81.32 11/21/2009 0909   GFRAA 99 11/15/2008 0951    Lipid Panel     Component Value Date/Time   CHOL 151 01/31/2012 0937   TRIG 134.0 01/31/2012 0937   HDL 51.60 01/31/2012 0937   CHOLHDL 3 01/31/2012 0937   VLDL 26.8 01/31/2012 0937   LDLCALC 73 01/31/2012 0937    CBC    Component Value Date/Time   WBC 9.4 01/31/2012 0937   RBC 4.81 01/31/2012 0937   HGB 15.0 01/31/2012 0937   HCT 45.5 01/31/2012 0937   PLT 183.0 01/31/2012 0937   MCV 94.7  01/31/2012 0937   MCHC 33.0 01/31/2012 0937   RDW 13.1 01/31/2012 0937   LYMPHSABS 1.5 01/31/2012 0937   MONOABS 0.8 01/31/2012 0937   EOSABS 0.1 01/31/2012 0937   BASOSABS 0.0 01/31/2012 4696

## 2012-05-20 ENCOUNTER — Other Ambulatory Visit: Payer: Self-pay | Admitting: Internal Medicine

## 2012-06-02 ENCOUNTER — Encounter: Payer: Self-pay | Admitting: Pulmonary Disease

## 2012-06-02 ENCOUNTER — Ambulatory Visit (INDEPENDENT_AMBULATORY_CARE_PROVIDER_SITE_OTHER)
Admission: RE | Admit: 2012-06-02 | Discharge: 2012-06-02 | Disposition: A | Discharge status: Home / Self Care | Payer: 59 | Source: Ambulatory Visit | Attending: Pulmonary Disease | Admitting: Pulmonary Disease

## 2012-06-02 ENCOUNTER — Ambulatory Visit (INDEPENDENT_AMBULATORY_CARE_PROVIDER_SITE_OTHER): Payer: 59 | Admitting: Pulmonary Disease

## 2012-06-02 VITALS — BP 122/82 | HR 68 | Temp 97.7°F | Ht 72.0 in | Wt 182.4 lb

## 2012-06-02 DIAGNOSIS — R05 Cough: Secondary | ICD-10-CM

## 2012-06-02 DIAGNOSIS — J8410 Pulmonary fibrosis, unspecified: Secondary | ICD-10-CM

## 2012-06-02 NOTE — Assessment & Plan Note (Signed)
The patient feels that his breathing is stable from the last visit, and he is due for a chest x-ray today.  He will have followup PFTs next spring.  The patient has decided to follow this for now because of his lack of symptoms, but I have recommended a formal lung biopsy if he has any worsening of symptoms or chest x-ray/PFTs.

## 2012-06-02 NOTE — Addendum Note (Signed)
Addended by: Nita Sells on: 06/02/2012 10:49 AM   Modules accepted: Orders

## 2012-06-02 NOTE — Progress Notes (Signed)
  Subjective:    Patient ID: Bradley May, male    DOB: 08-16-1951, 61 y.o.   MRN: [id number]  HPI The patient comes in today for followup of his known interstitial lung disease of unknown origin, as well as chronic cough.  The patient feels very strongly this is related to postnasal drip and throat irritation, but he has not responded to aggressive treatment including a course of prednisone and Singulair at the last visit.  He feels a fullness in his throat, and actually throat discomfort when he presses in the area externally.  He has not had any recent upper airway evaluation.  From a pulmonary standpoint, the patient feels that his exertional tolerance is at baseline.  He continues to stay fairly active.   Review of Systems  Constitutional: Negative for fever and unexpected weight change.  HENT: Positive for rhinorrhea, trouble swallowing, postnasal drip and sinus pressure. Negative for ear pain, nosebleeds, congestion, sore throat, sneezing and dental problem.   Eyes: Negative for redness and itching.  Respiratory: Positive for cough, shortness of breath and wheezing. Negative for chest tightness.   Cardiovascular: Negative for palpitations and leg swelling.  Gastrointestinal: Negative for nausea and vomiting.  Genitourinary: Negative for dysuria.  Musculoskeletal: Negative for joint swelling.  Skin: Negative for rash.  Neurological: Negative for headaches.  Hematological: Does not bruise/bleed easily.  Psychiatric/Behavioral: Negative for dysphoric mood. The patient is not nervous/anxious.        Objective:   Physical Exam Thin male in no acute distress Nose without purulence or discharge noted Chest with bibasilar crackles, no wheezes or rhonchi Cardiac exam with regular rate and rhythm Lower extremities without edema, no cyanosis Alert and oriented, moves all 4 extremities.       Assessment & Plan:

## 2012-06-02 NOTE — Patient Instructions (Addendum)
Will refer to ENT for upper airway evaluation.  If nothing is found, will refer to allergy to help with postnasal drip Will check cxr today to followup your pulmonary fibrosis. followup with me in 6 mos, with breathing tests the same day.

## 2012-06-02 NOTE — Assessment & Plan Note (Signed)
The patient continues to have a cough but he feels strongly is upper airway in origin.  He has been treated aggressively for postnasal drip without success.  He is concerned about his throat discomfort, and therefore I will refer him to otolaryngology for an upper airway evaluation.  If this is unremarkable, I would refer to allergy given that he continues to have postnasal drip despite aggressive treatment.

## 2012-06-18 ENCOUNTER — Telehealth: Payer: Self-pay | Admitting: Pulmonary Disease

## 2012-06-18 NOTE — Telephone Encounter (Signed)
I spoke with ENT and stated they did not find anything. He is wanting to know what the next step is. Please advise Dr. [name], thanks

## 2012-06-19 NOTE — Telephone Encounter (Signed)
See if we can get the note from ENT for my review.

## 2012-06-22 ENCOUNTER — Other Ambulatory Visit: Payer: Self-pay | Admitting: Pulmonary Disease

## 2012-06-22 DIAGNOSIS — R05 Cough: Secondary | ICD-10-CM

## 2012-06-22 NOTE — Telephone Encounter (Signed)
I spoke with April at Dr. Jearld Fenton office and she will fax over the office note. Note has been placed in [facility] look at folder for review.

## 2012-06-22 NOTE — Telephone Encounter (Signed)
Reviewed ENT note.  They could not find any structural issue in the upper airway.  Would be willing to do ct/mri if an allergy evaluation is unrevealing.  Pt is agreeable to seeing allergy since he has active symptoms.  Will send a referral to Dr. Maple Hudson.

## 2012-06-23 NOTE — Telephone Encounter (Signed)
Per [facility] referral note:  Summary  Appointment scheduled with Dr. Maple Hudson for Allergy Eval for 08/07/12 at 1:45. Pt is aware. Rhonda J Cobb ==================== As pt is aware of referral and upcoming appt, will sign off.

## 2012-08-07 ENCOUNTER — Other Ambulatory Visit: Payer: 59

## 2012-08-07 ENCOUNTER — Encounter: Payer: Self-pay | Admitting: Internal Medicine

## 2012-08-07 ENCOUNTER — Ambulatory Visit (INDEPENDENT_AMBULATORY_CARE_PROVIDER_SITE_OTHER): Payer: 59 | Admitting: Internal Medicine

## 2012-08-07 VITALS — BP 112/68 | HR 95 | Ht 72.0 in | Wt 182.6 lb

## 2012-08-07 DIAGNOSIS — J302 Other seasonal allergic rhinitis: Secondary | ICD-10-CM

## 2012-08-07 DIAGNOSIS — J309 Allergic rhinitis, unspecified: Secondary | ICD-10-CM

## 2012-08-07 DIAGNOSIS — R05 Cough: Secondary | ICD-10-CM

## 2012-08-07 DIAGNOSIS — Z23 Encounter for immunization: Secondary | ICD-10-CM

## 2012-08-07 NOTE — Patient Instructions (Addendum)
Sample Nasonex nasal spray   2 sprays each nostril once every day at bedtime.      Try this instead of flonase  Order- lab Allergy profile    Dx allergic rhinitis  Flu vax

## 2012-08-07 NOTE — Progress Notes (Signed)
08/07/12- 65 yoM former smoker referred by Dr [name] for allergy evaluation.  Followed by Dr [name] for pulmonary management of interstitial lung disease of unknown etiology.CAD/ hx MI. Cough has interfered with attempt to perform PFT. He blames cough on constant postnasal drip which is chronic over many years. I pointed out that cough is characteristically a symptom of ILD. He says nasal drainage is worse in the fall and spring seasons and with exposure to dust and maybe mold. There is some itching and sneezing. Antihistamines and decongestants aggravate symptoms of BPH. Describes very thick mucus difficult to swallow at clear in color. Some sinus pressure without sinus headache. Minor episodes of infected sinusitis in the past. No history of asthma. Surgical history of deviated septum repair with no trauma. Quit smoking 1992. Works in an Recruitment consultant for [employer]. No history of problems from exposure to insect stings, foods or urticaria. Denies GERD. Admits some shortness of breath with exertion and nasal congestion.  Prior to Admission medications   Medication Sig Start Date End Date Taking? Authorizing Provider  aspirin 81 MG tablet Take 81 mg by mouth daily.     Yes Historical Provider, MD  CRESTOR 40 MG tablet TAKE 1 TABLET EVERY DAY 05/20/12  Yes [name], MD  DIOVAN 80 MG tablet TAKE 1 TABLET EVERY DAY 03/02/12  Yes [name], MD  fluticasone [facility]) 50 MCG/ACT nasal spray USE TWO PUFFS INTO EACH NOSTRIL DAILY 03/18/12  Yes [name], MD  metoprolol succinate (TOPROL-XL) 25 MG 24 hr tablet Take 25 mg by mouth daily.   Yes Historical Provider, MD  nitroGLYCERIN (NITROSTAT) 0.4 MG SL tablet Place 0.4 mg under the tongue every 5 (five) minutes as needed. For chest pain. May repeat 3 times    Yes Historical Provider, MD  silodosin (RAPAFLO) 8 MG CAPS capsule Take 8 mg by mouth as needed.     Yes Historical Provider, MD   Past Medical History    Diagnosis Date  . CAD (coronary artery disease)     Dr. [name]  . BPH (benign prostatic hypertrophy)     Dr. [name]  . Interstitial lung disease     Dr. [name]  . Allergic rhinitis    Past Surgical History  Procedure Date  . Knee surgery 1978    Right  . Angioplasty 1992  . Coronary angioplasty with stent placement 01/2001    RCA  . Esophagogastroduodenoscopy 01/2001    bleeding  . Ureteral stent placement 01/2009    Dr. [name]   Family History  Problem Relation Age of Onset  . Coronary artery disease Father   . Diabetes Father   . Hypertension Father   . Cancer Father     prostate  . Hypertension Mother    History   Social History  . Marital Status: Married    Spouse Name: N/A    Number of Children: 1  . Years of Education: N/A   Occupational History  . Risk analyst    Social History Main Topics  . Smoking status: Former Smoker -- 1.0 packs/day for 25 years    Types: Cigarettes    Quit date: 09/23/1990  . Smokeless tobacco: Never Used  . Alcohol Use: Yes     Comment: Social  . Drug Use: No  . Sexually Active: Not on file   Other Topics Concern  . Not on file   Social History Narrative  . No narrative on file   ROS-see HPI  Constitutional:   No-   weight loss, night sweats, fevers, chills, fatigue, lassitude. HEENT:   No-  headaches, difficulty swallowing, tooth/dental problems, sore throat,       + sneezing, itching, ear ache, +nasal congestion, post nasal drip,  CV:  No-   chest pain, orthopnea, PND, swelling in lower extremities, anasarca, dizziness, palpitations Resp: +  shortness of breath with exertion or at rest.              No-   productive cough,  + non-productive cough,  No- coughing up of blood.              No-   change in color of mucus.  No- wheezing.   Skin: No-   rash or lesions. GI:  No-   heartburn, indigestion, abdominal pain, nausea, vomiting, diarrhea,                 change in bowel habits, loss of appetite GU: No-   dysuria,  change in color of urine, no urgency or frequency.  No- flank pain. MS:  No-   joint pain or swelling.  No- decreased range of motion.  No- back pain. Neuro-     nothing unusual Psych:  No- change in mood or affect. No depression or anxiety.  No memory loss.  OBJ- Physical Exam General- Alert, Oriented, Affect-appropriate, Distress- none acute. Trim Skin- rash-none, lesions- none, excoriation- none Lymphadenopathy- none Head- atraumatic            Eyes- Gross vision intact, PERRLA, conjunctivae and secretions clear            Ears- Hearing, canals-normal            Nose- Clear, +Septal dev/ external nose, mucus, polyps, erosion, perforation             Throat- Mallampati II , mucosa clear , drainage- none, tonsils- atrophic Neck- flexible , trachea midline, no stridor , thyroid nl, carotid no bruit Chest - symmetrical excursion , unlabored           Heart/CV- RRR , no murmur , no gallop  , no rub, nl s1 s2                           - JVD- none , edema- none, stasis changes- none, varices- none           Lung- + coarse rattle mid back, unlabored, wheeze- none, cough- none , dullness-none, rub- none           Chest wall-  Abd- tender-no, distended-no, bowel sounds-present, HSM- no Br/ Gen/ Rectal- Not done, not indicated Extrem- cyanosis- none, clubbing, none, atrophy- none, strength- nl Neuro- grossly intact to observation

## 2012-08-10 LAB — ALLERGY FULL PROFILE
Allergen, D pternoyssinus,d7: <0.1 kU/L
Bahia Grass: <0.1 kU/L
Box Elder IgE: <0.1 kU/L
Common Ragweed: <0.1 kU/L
Dog Dander: <0.1 kU/L
Elm IgE: <0.1 kU/L
G005 Rye, Perennial: <0.1 kU/L
G009 Red Top: <0.1 kU/L
Goldenrod: <0.1 kU/L
Helminthosporium halodes: <0.1 kU/L
House Dust Hollister: <0.1 kU/L
Oak: <0.1 kU/L
Sycamore Tree: <0.1 kU/L

## 2012-08-16 ENCOUNTER — Encounter: Payer: Self-pay | Admitting: Internal Medicine

## 2012-08-16 NOTE — Assessment & Plan Note (Signed)
Cough. Be directly related to known interstitial lung disease. The patient attributes it to his postnasal drainage which is being worked up now as allergic rhinitis.

## 2012-08-16 NOTE — Assessment & Plan Note (Addendum)
Primary triggers are considered to be seasonal pollens dust and possibly mold. Plan-allergy profile, trial of nasal steroid spray

## 2012-08-17 ENCOUNTER — Telehealth: Payer: Self-pay | Admitting: Internal Medicine

## 2012-08-17 NOTE — Telephone Encounter (Signed)
Notes Recorded by Tommie Sams, CMA on 08/14/2012 at 3:58 PM [id number] x1 ------  Notes Recorded by Waymon Budge, MD on 08/10/2012 at 7:21 PM Allergy antibody levels are not elevated according to this test panel. We can discuss at next ov.      Pt is advised Carron Curie, CMA

## 2012-08-21 IMAGING — CT CT CHEST W/O CM
1 of 4 series · 4 of 36 positions shown, 5 images · non-contrast
Comparison: 02/09/2010

CLINICAL DATA: Interstitial lung disease.  Evaluate for interval
worsening.  Increasing shortness of breath and cough.

CT CHEST WITHOUT CONTRAST
TECHNIQUE: Multidetector CT imaging of the chest was performed
following the standard protocol without IV contrast.

[Series 602: <mpr thick range> · coronal · 0.78mm/px · 4 of 125 slices shown, 5 images]
[im 25/125  mediastinal]
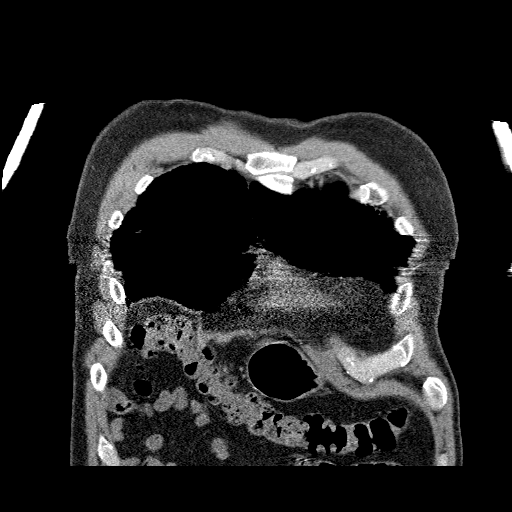
[im 25/125  lung]
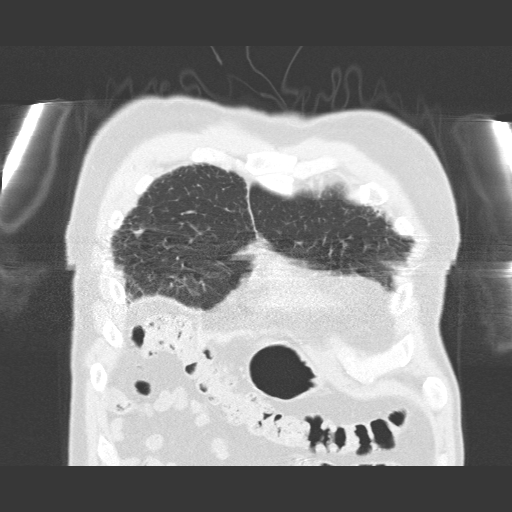
[im 50/125  lung]
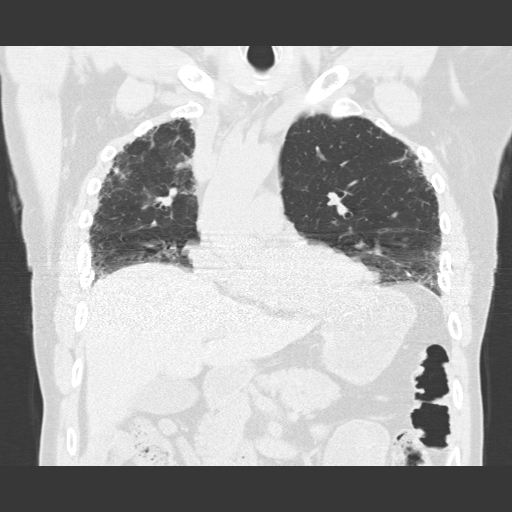
[im 75/125  lung]
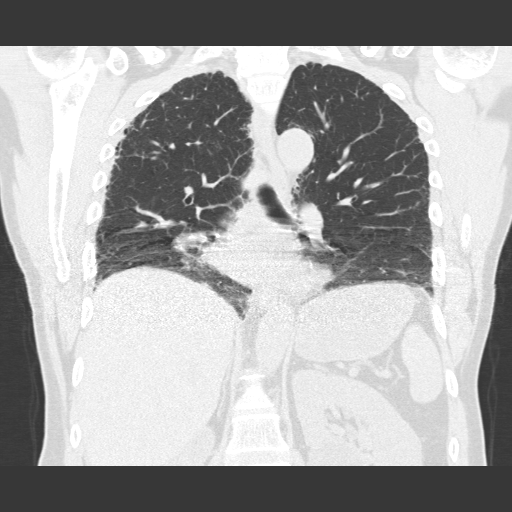
[im 100/125  lung]
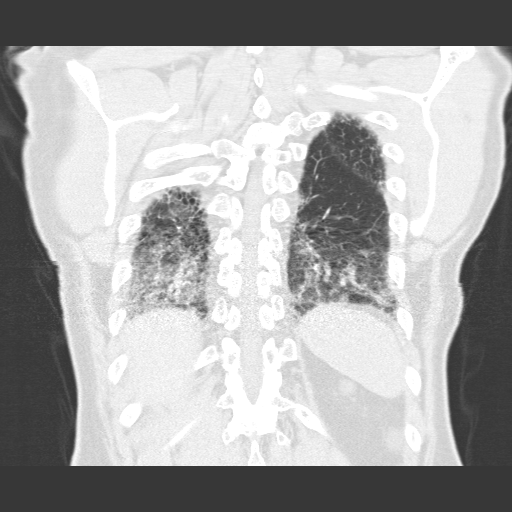

[4 of 36 positions shown; findings below may reference images not displayed]

FINDINGS: No pathologically enlarged mediastinal or axillary lymph
nodes.  Hilar regions are difficult to definitively evaluate
without IV contrast.  Heart size within normal limits.  No
pericardial effusion.

There is respiratory motion at the lung bases, limiting diagnostic
quality.  A peripheral and basilar predominant pattern of
intralobular septal thickening and honeycombing appears similar to
02/09/2010.  A subpleural polygonal shaped nodule in the apical
segment right upper lobe (image 16) is stable from 02/13/2008, and
is therefore likely benign.  No pleural fluid.  Airway is
unremarkable.

Incidental imaging of the upper abdomen shows low attenuation
lesions in the liver measuring up to 9.2 cm, as before.  There are
low attenuation lesions in the left kidney, some of which are
partially imaged, but are likely cysts.  The largest lesion
measures approximately 4.9 cm. A 2.8 cm low attenuation lesion in
the right suprarenal fossa is of unclear origin, as it is
incompletely imaged.  No worrisome lytic or sclerotic lesions.
IMPRESSION: 1.  Pattern of pulmonary fibrosis is indicative of usual
interstitial pneumonitis (UIP).  No appreciable interval
progression.
2.  Low attenuation lesion, in the right suprarenal fossa, is
incompletely imaged making its origin and clear.  If further
evaluation is desired, renal ultrasound could be performed in
further initial evaluation, as clinically indicated.

## 2012-09-10 ENCOUNTER — Telehealth: Payer: Self-pay | Admitting: Internal Medicine

## 2012-09-10 NOTE — Telephone Encounter (Signed)
Called and spoke with pt and he is aware of these results and nothing further is needed.

## 2012-09-30 ENCOUNTER — Encounter: Payer: Self-pay | Admitting: Internal Medicine

## 2012-09-30 ENCOUNTER — Ambulatory Visit (INDEPENDENT_AMBULATORY_CARE_PROVIDER_SITE_OTHER): Payer: 59 | Admitting: Internal Medicine

## 2012-09-30 VITALS — BP 122/74 | HR 60 | Ht 72.0 in | Wt 184.2 lb

## 2012-09-30 DIAGNOSIS — J310 Chronic rhinitis: Secondary | ICD-10-CM

## 2012-09-30 DIAGNOSIS — J8410 Pulmonary fibrosis, unspecified: Secondary | ICD-10-CM

## 2012-09-30 NOTE — Patient Instructions (Addendum)
Sample Dymista nasal spray   1-2 puffs each nostril once daily at bedtime  Sample AYR nasal saline gel   Apply into each nostril as needed to protect, soothe and moisturize to prevent nose bleeds.  I can see you again as needed, but for now, Dr [name] will follow- up

## 2012-09-30 NOTE — Progress Notes (Signed)
08/07/12- 1 yoM former smoker referred by Dr [name] for allergy evaluation.  Followed by Dr [name] for pulmonary management of interstitial lung disease of unknown etiology.CAD/ hx MI. Cough has interfered with attempt to perform PFT. He blames cough on constant postnasal drip which is chronic over many years. I pointed out that cough is characteristically a symptom of ILD. He says nasal drainage is worse in the fall and spring seasons and with exposure to dust and maybe mold. There is some itching and sneezing. Antihistamines and decongestants aggravate symptoms of BPH. Describes very thick mucus difficult to swallow at clear in color. Some sinus pressure without sinus headache. Minor episodes of infected sinusitis in the past. No history of asthma. Surgical history of deviated septum repair with no trauma. Quit smoking 1992. Works in an Recruitment consultant for [employer]. No history of problems from exposure to insect stings, foods or urticaria. Denies GERD. Admits some shortness of breath with exertion and nasal congestion.  09/30/12- 53 yoM former smoker referred by Dr [name] for allergy evaluation.  Followed by Dr [name] for pulmonary management of interstitial lung disease of unknown etiology.CAD/ hx MI. Since last here, not clearly any better with Nasonex. He had hoped that treatment for subjective postnasal drip might reduce his cough although we explained that cough is characteristic of his ILD. Minor epistaxis left nostril. Allergy Profile 08/07/2012-reviewed with him Total IgE 11.3 with no specific elevations. This makes significant IgE mediated allergy symptoms unlikely.  ROS-see HPI Constitutional:   No-   weight loss, night sweats, fevers, chills, fatigue, lassitude. HEENT:   No-  headaches, difficulty swallowing, tooth/dental problems, sore throat,       No-sneezing, itching, ear ache, +nasal congestion, +post nasal drip,  CV:  No-   chest pain, orthopnea, PND,  swelling in lower extremities, anasarca, dizziness, palpitations Resp: +  shortness of breath with exertion or at rest.              No-   productive cough,  + non-productive cough,  No- coughing up of blood.              No-   change in color of mucus.  No- wheezing.   Skin: No-   rash or lesions. GI:  No-   heartburn, indigestion, abdominal pain, nausea, vomiting,  GU: . MS:  No-   joint pain or swelling.   Neuro-     nothing unusual Psych:  No- change in mood or affect. No depression or anxiety.  No memory loss.  OBJ- Physical Exam General- Alert, Oriented, Affect-appropriate, Distress- none acute. Trim Skin- rash-none, lesions- none, excoriation- none Lymphadenopathy- none Head- atraumatic            Eyes- Gross vision intact, PERRLA, conjunctivae and secretions clear            Ears- Hearing, canals-normal            Nose- + small blood crusted superficial left nostril, +Septal dev/ external nose, mucus, polyps, erosion, perforation             Throat- Mallampati II , mucosa clear , drainage- none, tonsils- atrophic Neck- flexible , trachea midline, no stridor , thyroid nl, carotid no bruit Chest - symmetrical excursion , unlabored           Heart/CV- RRR , no murmur , no gallop  , no rub, nl s1 s2                           -  JVD- none , edema- none, stasis changes- none, varices- none           Lung- + crackles to mid back, unlabored, wheeze- none, cough- none , dullness-none, rub- none           Chest wall-  Abd-  Br/ Gen/ Rectal- Not done, not indicated Extrem- cyanosis- none, clubbing, none, atrophy- none, strength- nl Neuro- grossly intact to observation

## 2012-10-11 NOTE — Assessment & Plan Note (Signed)
Bothersome cough is more likely related to this diagnosis than to rhinitis with postnasal drip.

## 2012-10-11 NOTE — Assessment & Plan Note (Signed)
Epistaxis may be caused by his Nasonex. We discussed this. I would like to let him try Dymista has one more effort to address his perception that he has postnasal drainage. I explained that the steroid and Dymista might sustain his epistaxis. He will watch for that. Bleeding has been trivial so far and there is not significant erosion.

## 2012-11-30 ENCOUNTER — Telehealth: Payer: Self-pay | Admitting: Pulmonary Disease

## 2012-11-30 NOTE — Telephone Encounter (Signed)
Ok to have ov without pfts.

## 2012-11-30 NOTE — Telephone Encounter (Signed)
I spoke with pt and PFT has been cancelled. Nothing further was needed

## 2012-11-30 NOTE — Telephone Encounter (Signed)
Spoke with patient he states he has been having a cough and is concerned about having pft done tomorrow prior to his office visit.  States last time he had "breathing test" done every time he blew it irritated his cough even more. Patient wants to know if he can still have ov without doing pft at this time.  Dr. [name], please advise, thank you!

## 2012-12-01 ENCOUNTER — Ambulatory Visit (INDEPENDENT_AMBULATORY_CARE_PROVIDER_SITE_OTHER): Payer: 59 | Admitting: Pulmonary Disease

## 2012-12-01 ENCOUNTER — Other Ambulatory Visit: Payer: Self-pay | Admitting: Pulmonary Disease

## 2012-12-01 ENCOUNTER — Encounter: Payer: Self-pay | Admitting: Pulmonary Disease

## 2012-12-01 VITALS — BP 118/82 | HR 60 | Temp 97.4°F | Ht 72.0 in | Wt 183.8 lb

## 2012-12-01 DIAGNOSIS — R05 Cough: Secondary | ICD-10-CM

## 2012-12-01 DIAGNOSIS — J8410 Pulmonary fibrosis, unspecified: Secondary | ICD-10-CM

## 2012-12-01 MED ORDER — GABAPENTIN 300 MG PO CAPS: 300.0000 mg | ORAL_CAPSULE | Freq: Two times a day (BID) | ORAL | Status: DC

## 2012-12-01 NOTE — Assessment & Plan Note (Signed)
The patient's radiographic pattern is most consistent with UIP, and with his negative autoimmune workup in the past, this is likely to be idiopathic pulmonary fibrosis.  He feels that his breathing is worsening, and he has not had a CT chest since 2011.  We'll therefore repeat his CT chest.

## 2012-12-01 NOTE — Assessment & Plan Note (Signed)
The patient continues to have a cough that may be due to to his underlying interstitial disease, or may still be upper airway in origin and related to the upper airway cough syndrome.  He is convinced there may be in an anatomical issue in his upper airway, and is asking about imaging of his neck.  I would also like to try Neurontin to see if that will help with his abnormal sensations in his throat.  However, once all is said and done, this may simply be related to his underlying interstitial disease.

## 2012-12-01 NOTE — Patient Instructions (Addendum)
Will discuss with ENT the easiest way to image the soft tissues of the neck. Will order ct chest with this once I get the information Trial of neurontin 300mg  one in am and pm for next 3 weeks.  Please call to give me feedback if it helps cough. Will call once the xray results are available.

## 2012-12-01 NOTE — Progress Notes (Signed)
  Subjective:    Patient ID: Bradley May, male    DOB: Feb 02, 1951, 62 y.o.   MRN: [id number]  HPI The pt comes in today for f/u of his known ISLD and cough.  He has been seen by ENT and allergy, with no obvious source for his cough.  He continues to feel there is something in his throat, and is asking about imaging soft tissues of neck.  He feels his doe has worsened since the last visit, but not significantly.  The cough occurs anytime he tries to talk, and he cancelled his pfts because he feels he could not give his best effort due to his coughing.    Review of Systems  Constitutional: Negative for fever and unexpected weight change.  HENT: Positive for postnasal drip. Negative for ear pain, nosebleeds, congestion, sore throat, rhinorrhea, sneezing, trouble swallowing, dental problem and sinus pressure.   Eyes: Negative for redness and itching.  Respiratory: Positive for cough, shortness of breath and wheezing. Negative for chest tightness.   Cardiovascular: Negative for palpitations and leg swelling.  Gastrointestinal: Negative for nausea and vomiting.  Genitourinary: Negative for dysuria.  Musculoskeletal: Negative for joint swelling.  Skin: Negative for rash.  Neurological: Negative for headaches.  Hematological: Does not bruise/bleed easily.  Psychiatric/Behavioral: Negative for dysphoric mood. The patient is not nervous/anxious.        Objective:   Physical Exam Thin male in nad Nose without purulence or discharge noted. Neck without LN or TMG Chest with crackles 1/3 up bilat, no wheezing or rhonchi Cor with rrr LE without edema, no cyanosis Alert and oriented, moves all 4.        Assessment & Plan:

## 2012-12-03 ENCOUNTER — Other Ambulatory Visit: Payer: 59

## 2012-12-07 ENCOUNTER — Ambulatory Visit (INDEPENDENT_AMBULATORY_CARE_PROVIDER_SITE_OTHER)
Admission: RE | Admit: 2012-12-07 | Discharge: 2012-12-07 | Disposition: A | Discharge status: Home / Self Care | Payer: 59 | Source: Ambulatory Visit | Attending: Pulmonary Disease | Admitting: Pulmonary Disease

## 2012-12-07 DIAGNOSIS — J8410 Pulmonary fibrosis, unspecified: Secondary | ICD-10-CM

## 2012-12-07 DIAGNOSIS — R05 Cough: Secondary | ICD-10-CM

## 2012-12-07 MED ORDER — IOHEXOL 300 MG/ML  SOLN
80.0000 mL | Freq: Once | INTRAMUSCULAR | Status: AC | PRN
  Administered 2012-12-07: 80 mL via INTRAVENOUS

## 2012-12-11 ENCOUNTER — Encounter: Payer: Self-pay | Admitting: Pulmonary Disease

## 2012-12-21 ENCOUNTER — Telehealth: Payer: Self-pay | Admitting: Pulmonary Disease

## 2012-12-21 MED ORDER — TRAMADOL HCL 50 MG PO TABS: 50.0000 mg | ORAL_TABLET | Freq: Four times a day (QID) | ORAL | Status: DC | PRN

## 2012-12-21 NOTE — Telephone Encounter (Signed)
Would like to try tramadol 50mg , one every 6 hrs if needed for cough for the next 2 weeks. #50, no fills. Give [place] some feedback in 2 weeks.

## 2012-12-21 NOTE — Telephone Encounter (Signed)
I spoke with pt and is aware of KC recs. RX has been called in. Nothing further was needed

## 2012-12-21 NOTE — Telephone Encounter (Signed)
Spoke with patient, states Neurontin has not helped improve his cough. States it hasn't gotten any worse, but definitely not any better Patient states Dr. [name] said something about trying a different medication if the Neurontin did not work, patient wants to know if this is still the plan. Dr. [name] please advise, thank you  Last OV: 12/01/12 Patient Instructions    Will discuss with ENT the easiest way to image the soft tissues of the neck.  Will order ct chest with this once I get the information  Trial of neurontin 300mg  one in am and pm for next 3 weeks. Please call to give me feedback if it helps cough.  Will call once the xray results are available.

## 2013-01-05 ENCOUNTER — Telehealth: Payer: Self-pay | Admitting: Pulmonary Disease

## 2013-01-05 MED ORDER — TRAMADOL HCL 50 MG PO TABS: 50.0000 mg | ORAL_TABLET | Freq: Four times a day (QID) | ORAL | Status: DC | PRN

## 2013-01-05 NOTE — Telephone Encounter (Signed)
Spoke with patient, made him aware of recs as listed below. Patient agreed to have rx sent to pharamacy. Rx sent patient aware and nothing further needed at this time.

## 2013-01-05 NOTE — Telephone Encounter (Signed)
I spoke with pt. He stated the tramadol has helped with his cough.. Still has occasional dry cough and sometimes he will cough up clear phlem. C/o PND and nasal congestion. Please advise KC thanks  Allergies  Allergen Reactions  . Atorvastatin     REACTION: Headache  . Doxycycline     REACTION: Nausea

## 2013-01-05 NOTE — Telephone Encounter (Signed)
If he feels tramadol has helped, and wishes to take as needed, we can send in script Tramadol 50mg  one every 6hrs if needed (#50, one fill).  For his postnasal drip, can try to add chlorpheniramine 4mg  otc, and take 2 at bedtime.  I think he has tried this before, but would try again.

## 2013-02-03 ENCOUNTER — Ambulatory Visit (INDEPENDENT_AMBULATORY_CARE_PROVIDER_SITE_OTHER): Payer: 59 | Admitting: Internal Medicine

## 2013-02-03 ENCOUNTER — Encounter: Payer: Self-pay | Admitting: Internal Medicine

## 2013-02-03 VITALS — BP 110/70 | HR 67 | Temp 98.3°F | Ht 71.5 in | Wt 177.0 lb

## 2013-02-03 DIAGNOSIS — J8410 Pulmonary fibrosis, unspecified: Secondary | ICD-10-CM

## 2013-02-03 DIAGNOSIS — I2510 Atherosclerotic heart disease of native coronary artery without angina pectoris: Secondary | ICD-10-CM

## 2013-02-03 DIAGNOSIS — N400 Enlarged prostate without lower urinary tract symptoms: Secondary | ICD-10-CM

## 2013-02-03 DIAGNOSIS — E785 Hyperlipidemia, unspecified: Secondary | ICD-10-CM

## 2013-02-03 DIAGNOSIS — Z0000 Encounter for general adult medical examination without abnormal findings: Secondary | ICD-10-CM

## 2013-02-03 LAB — CBC WITH DIFFERENTIAL/PLATELET
Basophils Relative: 0.3 % (ref 0.0–3.0)
Eosinophils Absolute: 0.2 10*3/uL (ref 0.0–0.7)
Hemoglobin: 14.6 g/dL (ref 13.0–17.0)
Lymphocytes Relative: 17.6 % (ref 12.0–46.0)
MCHC: 34.3 g/dL (ref 30.0–36.0)
Monocytes Relative: 8.5 % (ref 3.0–12.0)
Neutrophils Relative %: 70.5 % (ref 43.0–77.0)
RBC: 4.63 Mil/uL (ref 4.22–5.81)
WBC: 7 10*3/uL (ref 4.5–10.5)

## 2013-02-03 LAB — LIPID PANEL
HDL: 39.1 mg/dL (ref >39.00)
LDL Cholesterol: 75 mg/dL (ref 0–99)
Total CHOL/HDL Ratio: 3
Triglycerides: 82 mg/dL (ref 0.0–149.0)
VLDL: 16.4 mg/dL (ref 0.0–40.0)

## 2013-02-03 LAB — HEPATIC FUNCTION PANEL
ALT: 16 U/L (ref 0–53)
Alkaline Phosphatase: 30 U/L — ABNORMAL LOW (ref 39–117)
Bilirubin, Direct: 0.1 mg/dL (ref 0.0–0.3)
Total Bilirubin: 1.1 mg/dL (ref 0.3–1.2)

## 2013-02-03 LAB — BASIC METABOLIC PANEL
CO2: 29 mEq/L (ref 19–32)
Calcium: 9.2 mg/dL (ref 8.4–10.5)
Creatinine, Ser: 1 mg/dL (ref 0.4–1.5)
GFR: 80.45 mL/min (ref >60.00)
Sodium: 137 mEq/L (ref 135–145)

## 2013-02-03 NOTE — Assessment & Plan Note (Signed)
Has been quiet Will be getting new cardiologist

## 2013-02-03 NOTE — Progress Notes (Signed)
Subjective:    Patient ID: Bradley May, male    DOB: 1951/08/16, 62 y.o.   MRN: [id number]  HPI Here for physical  Ongoing cough Allergy testing is negative Dr [name] feels it is from his interstitial lung disease Tramadol helps cough Will get some DOE---"I can tell" Still tries to walk up to a mile 3 times per week  Still sees Dr [name] yearly Voids okay on rapaflo Gets PSA there  Current Outpatient Prescriptions on File Prior to Visit  Medication Sig Dispense Refill  . aspirin 81 MG tablet Take 81 mg by mouth daily.        . CRESTOR 40 MG tablet TAKE 1 TABLET EVERY DAY  30 each  11  . DIOVAN 80 MG tablet TAKE 1 TABLET EVERY DAY  30 tablet  11  . fluticasone (FLONASE) 50 MCG/ACT nasal spray USE TWO PUFFS INTO EACH NOSTRIL DAILY  16 g  11  . metoprolol succinate (TOPROL-XL) 25 MG 24 hr tablet Take 25 mg by mouth daily.      . nitroGLYCERIN (NITROSTAT) 0.4 MG SL tablet Place 0.4 mg under the tongue every 5 (five) minutes as needed. For chest pain. May repeat 3 times       . silodosin (RAPAFLO) 8 MG CAPS capsule Take 8 mg by mouth as needed.        . traMADol (ULTRAM) 50 MG tablet Take 1 tablet (50 mg total) by mouth every 6 (six) hours as needed (for cough).  50 tablet  1   No current facility-administered medications on file prior to visit.    Allergies  Allergen Reactions  . Atorvastatin     REACTION: Headache  . Doxycycline     REACTION: Nausea    Past Medical History  Diagnosis Date  . CAD (coronary artery disease)     Dr. [name]  . BPH (benign prostatic hypertrophy)     Dr. [name]  . Interstitial lung disease     Dr. [name]  . Allergic rhinitis     Past Surgical History  Procedure Laterality Date  . Knee surgery  1978    Right  . Angioplasty  1992  . Coronary angioplasty with stent placement  01/2001    RCA  . Esophagogastroduodenoscopy  01/2001    bleeding  . Ureteral stent placement  01/2009    Dr. [name]    Family History  Problem Relation Age of Onset   . Coronary artery disease Father   . Diabetes Father   . Hypertension Father   . Cancer Father     prostate  . Hypertension Mother     History   Social History  . Marital Status: Married    Spouse Name: N/A    Number of Children: 1  . Years of Education: N/A   Occupational History  . Risk analyst    Social History Main Topics  . Smoking status: Former Smoker -- 1.00 packs/day for 25 years    Types: Cigarettes    Quit date: 09/23/1990  . Smokeless tobacco: Never Used  . Alcohol Use: Yes     Comment: Social  . Drug Use: No  . Sexually Active: Not on file   Other Topics Concern  . Not on file   Social History Narrative  . No narrative on file   Review of Systems  Constitutional: Negative for fatigue and unexpected weight change.       Weight is down a few pounds Wears seat belt  HENT: Positive  for congestion and rhinorrhea. Negative for hearing loss, dental problem and tinnitus.        Regular with dentist  Eyes: Negative for visual disturbance.       No diplopia or unilateral vision loss  Respiratory: Positive for cough. Negative for chest tightness and shortness of breath.        DOE --see HPI  Cardiovascular: Negative for chest pain, palpitations and leg swelling.  Gastrointestinal: Negative for nausea, vomiting, abdominal pain, constipation and blood in stool.       No heartburn  Genitourinary: Positive for frequency and difficulty urinating. Negative for urgency.       Only mild trouble initiating stream Only uses rapaflo when persistent problems No sexual problems  Musculoskeletal: Negative for joint swelling, arthralgias and gait problem.  Skin: Negative for rash.       Using fluoroplex for left cheek lesion (Dr Roseanne Kaufman)  Allergic/Immunologic: Negative for environmental allergies and immunocompromised state.       Uses flonase  Neurological: Negative for dizziness, syncope, weakness, light-headedness, numbness and headaches.  Hematological:  Negative for adenopathy. Does not bruise/bleed easily.  Psychiatric/Behavioral: Negative for sleep disturbance and dysphoric mood. The patient is not nervous/anxious.        Objective:   Physical Exam  Constitutional: He is oriented to person, place, and time. He appears well-developed and well-nourished. No distress.  HENT:  Head: Normocephalic and atraumatic.  Right Ear: External ear normal.  Left Ear: External ear normal.  Mouth/Throat: Oropharynx is clear and moist.  Eyes: Conjunctivae and EOM are normal. Pupils are equal, round, and reactive to light.  Neck: Normal range of motion. Neck supple. No thyromegaly present.  Cardiovascular: Normal rate, regular rhythm, normal heart sounds and intact distal pulses.  Exam reveals no gallop.   No murmur heard. Pulmonary/Chest: Effort normal. No respiratory distress. He has no wheezes. He has rales.  Fine bibasilar crackles at both bases---right>left  Abdominal: Soft. Bowel sounds are normal. There is no tenderness.  Musculoskeletal: He exhibits no edema and no tenderness.  Lymphadenopathy:    He has no cervical adenopathy.  Neurological: He is alert and oriented to person, place, and time.  Skin: No rash noted. There is erythema.  Left cheek injection from Rx  Psychiatric: He has a normal mood and affect. His behavior is normal.          Assessment & Plan:

## 2013-02-03 NOTE — Assessment & Plan Note (Signed)
Only uses rapaflo prn

## 2013-02-03 NOTE — Assessment & Plan Note (Signed)
Hopefully slow or little progression Tramadol helps cough Dr [name] follows

## 2013-02-03 NOTE — Assessment & Plan Note (Signed)
PSA by Dr [name] UTD on imms and colon

## 2013-02-12 ENCOUNTER — Encounter: Payer: Self-pay | Admitting: Cardiology

## 2013-02-12 ENCOUNTER — Ambulatory Visit (INDEPENDENT_AMBULATORY_CARE_PROVIDER_SITE_OTHER): Payer: 59 | Admitting: Cardiology

## 2013-02-12 VITALS — BP 102/68 | HR 66 | Ht 72.0 in | Wt 178.4 lb

## 2013-02-12 DIAGNOSIS — I2510 Atherosclerotic heart disease of native coronary artery without angina pectoris: Secondary | ICD-10-CM

## 2013-02-12 DIAGNOSIS — I252 Old myocardial infarction: Secondary | ICD-10-CM

## 2013-02-12 DIAGNOSIS — J8410 Pulmonary fibrosis, unspecified: Secondary | ICD-10-CM

## 2013-02-12 DIAGNOSIS — E785 Hyperlipidemia, unspecified: Secondary | ICD-10-CM

## 2013-02-12 NOTE — Progress Notes (Signed)
HPI Bradley May returns today for evaluation and management of his history of coronary disease and percutaneous intervention. He is having no angina or ischemic symptoms. He does have dyspnea on exertion from his idiopathic pulmonary fibrosis. He has suffered a lot from a persistent cough but is better now on tramadol. He denies any wheezing. He is followed by Dr. [name].  Past Medical History  Diagnosis Date  . CAD (coronary artery disease)     Dr. [name]  . BPH (benign prostatic hypertrophy)     Dr. [name]  . Interstitial lung disease     Dr. [name]  . Allergic rhinitis     Current Outpatient Prescriptions  Medication Sig Dispense Refill  . aspirin 81 MG tablet Take 81 mg by mouth daily.        . CRESTOR 40 MG tablet TAKE 1 TABLET EVERY DAY  30 each  11  . DIOVAN 80 MG tablet TAKE 1 TABLET EVERY DAY  30 tablet  11  . fluticasone (FLONASE) 50 MCG/ACT nasal spray USE TWO PUFFS INTO EACH NOSTRIL DAILY  16 g  11  . metoprolol succinate (TOPROL-XL) 25 MG 24 hr tablet Take 25 mg by mouth daily.      . nitroGLYCERIN (NITROSTAT) 0.4 MG SL tablet Place 0.4 mg under the tongue every 5 (five) minutes as needed. For chest pain. May repeat 3 times       . silodosin (RAPAFLO) 8 MG CAPS capsule Take 8 mg by mouth as needed.        . traMADol (ULTRAM) 50 MG tablet Take 1 tablet (50 mg total) by mouth every 6 (six) hours as needed (for cough).  50 tablet  1   No current facility-administered medications for this visit.    Allergies  Allergen Reactions  . Atorvastatin     REACTION: Headache  . Doxycycline     REACTION: Nausea    Family History  Problem Relation Age of Onset  . Coronary artery disease Father   . Diabetes Father   . Hypertension Father   . Cancer Father     prostate  . Hypertension Mother     History   Social History  . Marital Status: Married    Spouse Name: N/A    Number of Children: 1  . Years of Education: N/A   Occupational History  . Risk analyst     Social History Main Topics  . Smoking status: Former Smoker -- 1.00 packs/day for 25 years    Types: Cigarettes    Quit date: 09/23/1990  . Smokeless tobacco: Never Used  . Alcohol Use: Yes     Comment: Social  . Drug Use: No  . Sexually Active: Not on file   Other Topics Concern  . Not on file   Social History Narrative  . No narrative on file    ROS ALL NEGATIVE EXCEPT THOSE NOTED IN HPI  PE  General Appearance: well developed, well nourished in no acute distress HEENT: symmetrical face, PERRLA, good dentition  Neck: no JVD, thyromegaly, or adenopathy, trachea midline Chest: symmetric without deformity Cardiac: PMI non-displaced, RRR, normal S1, S2, no gallop or murmur Lung: clear to ausculation and percussion, no wheezes or rhonchi, Velcro rales one third the way up. Vascular: all pulses full without bruits  Abdominal: nondistended, nontender, good bowel sounds, no HSM, no bruits Extremities: no cyanosis, clubbing or edema, no sign of DVT, no varicosities  Skin: normal color, no rashes Neuro: alert and oriented x 3,  non-focal Pysch: normal affect  EKG Not repeated BMET    Component Value Date/Time   NA 137 02/03/2013 1031   K 4.3 02/03/2013 1031   CL 103 02/03/2013 1031   CO2 29 02/03/2013 1031   GLUCOSE 93 02/03/2013 1031   BUN 12 02/03/2013 1031   CREATININE 1.0 02/03/2013 1031   CALCIUM 9.2 02/03/2013 1031   GFRNONAA 81.32 11/21/2009 0909   GFRAA 99 11/15/2008 0951    Lipid Panel     Component Value Date/Time   CHOL 130 02/03/2013 1031   TRIG 82.0 02/03/2013 1031   HDL 39.10 02/03/2013 1031   CHOLHDL 3 02/03/2013 1031   VLDL 16.4 02/03/2013 1031   LDLCALC 75 02/03/2013 1031    CBC    Component Value Date/Time   WBC 7.0 02/03/2013 1031   RBC 4.63 02/03/2013 1031   HGB 14.6 02/03/2013 1031   HCT 42.5 02/03/2013 1031   PLT 176.0 02/03/2013 1031   MCV 91.9 02/03/2013 1031   MCHC 34.3 02/03/2013 1031   RDW 12.4 02/03/2013 1031   LYMPHSABS 1.2 02/03/2013 1031    MONOABS 0.6 02/03/2013 1031   EOSABS 0.2 02/03/2013 1031   BASOSABS 0.0 02/03/2013 1031

## 2013-02-12 NOTE — Assessment & Plan Note (Signed)
Stable. Continue secondary preventative therapy. 

## 2013-02-12 NOTE — Patient Instructions (Addendum)
Your physician recommends that you continue on your current medications as directed. Please refer to the Current Medication list given to you today.  Your physician wants you to follow-up in: 1 year with Dr. Olga Millers.  You will receive a reminder letter in the mail two months in advance. If you don't receive a letter, please call our office to schedule the follow-up appointment.

## 2013-02-17 ENCOUNTER — Ambulatory Visit: Payer: Self-pay | Admitting: Urology

## 2013-02-22 ENCOUNTER — Other Ambulatory Visit: Payer: Self-pay | Admitting: Internal Medicine

## 2013-03-17 ENCOUNTER — Ambulatory Visit (INDEPENDENT_AMBULATORY_CARE_PROVIDER_SITE_OTHER): Payer: Self-pay | Admitting: Pulmonary Disease

## 2013-03-17 ENCOUNTER — Encounter: Payer: Self-pay | Admitting: Pulmonary Disease

## 2013-03-17 VITALS — BP 120/70 | HR 76 | Temp 98.3°F | Ht 72.0 in | Wt 175.0 lb

## 2013-03-17 DIAGNOSIS — R05 Cough: Secondary | ICD-10-CM

## 2013-03-17 DIAGNOSIS — R059 Cough, unspecified: Secondary | ICD-10-CM

## 2013-03-17 DIAGNOSIS — J8410 Pulmonary fibrosis, unspecified: Secondary | ICD-10-CM

## 2013-03-17 DIAGNOSIS — Z006 Encounter for examination for normal comparison and control in clinical research program: Secondary | ICD-10-CM

## 2013-03-17 LAB — PULMONARY FUNCTION TEST

## 2013-03-17 MED ORDER — TRAMADOL HCL 50 MG PO TABS: 50.0000 mg | ORAL_TABLET | Freq: Four times a day (QID) | ORAL | Status: DC | PRN

## 2013-03-17 NOTE — Progress Notes (Signed)
PFT done today. 

## 2013-03-17 NOTE — Patient Instructions (Addendum)
Will refill your tramadol. Will call you once all the paperwork has been submitted for the pirfenidone trial.

## 2013-03-17 NOTE — Progress Notes (Signed)
  Subjective:    Patient ID: Bradley May, male    DOB: December 31, 1950, 62 y.o.   MRN: [id number]  HPI The patient comes in today for followup of his known interstitial disease, most consistent with idiopathic primary fibrosis.  He feels that his cough is much improved with the tramadol, but he does feel that his exertional tolerance has declined since the last visit.  His last CT chest did show some mildly progressive disease, and his PFTs today does show a decline in his total lung capacity and diffusion capacity.   Review of Systems  Constitutional: Negative for fever and unexpected weight change.  HENT: Negative for ear pain, nosebleeds, congestion, sore throat, rhinorrhea, sneezing, trouble swallowing, dental problem, postnasal drip and sinus pressure.   Eyes: Negative for redness and itching.  Respiratory: Negative for cough, chest tightness, shortness of breath and wheezing.   Cardiovascular: Negative for palpitations and leg swelling.  Gastrointestinal: Negative for nausea and vomiting.  Genitourinary: Negative for dysuria.  Musculoskeletal: Negative for joint swelling.  Skin: Negative for rash.  Neurological: Negative for headaches.  Hematological: Does not bruise/bleed easily.  Psychiatric/Behavioral: Negative for dysphoric mood. The patient is not nervous/anxious.        Objective:   Physical Exam Thin male in no acute distress Nose without purulence or discharge noted Neck without lymphadenopathy or thyromegaly Chest with crackles one third way up bilaterally, no wheezing Cardiac exam with regular rate and rhythm Lower extremities without edema, cyanosis Alert and oriented, moves all 4 extremities.       Assessment & Plan:

## 2013-03-17 NOTE — Assessment & Plan Note (Signed)
The patient has interstitial lung disease that is most consistent with idiopathic pulmonary fibrosis.  He now has seen a decrease in his exertional tolerance, slight worsening of the CT chest, and now abnormalities in his diffusion capacity.  I think he is an excellent candidate for the pirfenidone trial, and the patient is agreeable.  Paperwork has been submitted for entry into the trial.

## 2013-03-17 NOTE — Assessment & Plan Note (Signed)
The patient's cough is better with tramadol as needed.  Will continue this medication.

## 2013-03-19 ENCOUNTER — Other Ambulatory Visit: Payer: Self-pay | Admitting: Internal Medicine

## 2013-03-31 ENCOUNTER — Encounter: Payer: Self-pay | Admitting: Pulmonary Disease

## 2013-06-10 ENCOUNTER — Other Ambulatory Visit: Payer: Self-pay | Admitting: Internal Medicine

## 2013-07-29 ENCOUNTER — Other Ambulatory Visit: Payer: Self-pay

## 2013-11-22 ENCOUNTER — Telehealth: Payer: Self-pay | Admitting: Pulmonary Disease

## 2013-11-22 MED ORDER — TRAMADOL HCL 50 MG PO TABS: 50.0000 mg | ORAL_TABLET | Freq: Four times a day (QID) | ORAL | Status: DC | PRN

## 2013-11-22 NOTE — Telephone Encounter (Signed)
Rx has been called in per [facility]. Pt is aware. Nothing further is needed at this time.

## 2013-11-22 NOTE — Telephone Encounter (Signed)
Last OV 03/17/13 No pending OV Last fill 03/17/13 with 5 additional refills.  Pt [facility].  [facility] - please advise on refill. Thanks.

## 2013-11-22 NOTE — Telephone Encounter (Signed)
Ok to refill with 5 fills.

## 2013-12-31 ENCOUNTER — Encounter: Payer: Self-pay | Admitting: Pulmonary Disease

## 2013-12-31 ENCOUNTER — Ambulatory Visit: Payer: 59 | Admitting: Pulmonary Disease

## 2013-12-31 VITALS — BP 128/82 | HR 79 | Temp 97.8°F | Ht 72.0 in | Wt 184.0 lb

## 2013-12-31 DIAGNOSIS — R059 Cough, unspecified: Secondary | ICD-10-CM

## 2013-12-31 DIAGNOSIS — J8410 Pulmonary fibrosis, unspecified: Secondary | ICD-10-CM

## 2013-12-31 DIAGNOSIS — R05 Cough: Secondary | ICD-10-CM

## 2013-12-31 NOTE — Progress Notes (Signed)
   Subjective:    Patient ID: Bradley May, male    DOB: [birth year], 63 y.o.   MRN: [id number]  HPI The patient comes in today for followup of his idiopathic pulmonary fibrosis. He has been on pirfenidone since July of last summer, and has done very well with the drug. He has not had any significant issue with diarrhea, nor but is sensitivity. His cough is well controlled with tramadol as needed, but he has seen some decline in his breathing since last visit.   Review of Systems  Constitutional: Negative for fever and unexpected weight change.  HENT: Positive for congestion and postnasal drip. Negative for dental problem, ear pain, nosebleeds, rhinorrhea, sinus pressure, sneezing, sore throat and trouble swallowing.   Eyes: Negative for redness and itching.  Respiratory: Positive for cough and shortness of breath. Negative for chest tightness, wheezing and stridor.   Cardiovascular: Negative for palpitations and leg swelling.  Gastrointestinal: Negative for nausea and vomiting.  Genitourinary: Negative for dysuria.  Musculoskeletal: Negative for joint swelling.  Skin: Negative for rash.  Neurological: Negative for headaches.  Hematological: Does not bruise/bleed easily.  Psychiatric/Behavioral: Negative for dysphoric mood. The patient is not nervous/anxious.        Objective:   Physical Exam Thin male in no acute distress Nose without purulence or discharge noted Neck without lymphadenopathy or thyromegaly Chest with crackles one third the way up bilaterally, no cyanosis Cardiac exam with regular rate and rhythm Lower extremities without edema, no cyanosis Alert and oriented, moves all 4 extremities.       Assessment & Plan:

## 2013-12-31 NOTE — Assessment & Plan Note (Signed)
The patient has been doing well on pirfenidone in terms of drug tolerance, but does feel that he has had some decrease in exertional tolerance since the summer. He is remaining quite active, and his cough is being well controlled with as needed tramadol. His liver function tests were checked last month, and will do this again at the next visit. He will also be due for his yearly PFTs at the next visit as well.

## 2013-12-31 NOTE — Patient Instructions (Signed)
Stay on pirfenidone, and let us know if any increased side effects. Will see you back in 4mos, with breathing studies the same day. Will check bloodwork next visit as well.

## 2014-01-07 ENCOUNTER — Telehealth: Payer: Self-pay | Admitting: Pulmonary Disease

## 2014-01-07 MED ORDER — PIRFENIDONE 267 MG PO CAPS: 3.0000 | ORAL_CAPSULE | Freq: Three times a day (TID) | ORAL | Status: DC

## 2014-01-07 NOTE — Telephone Encounter (Signed)
[facility] will be providing pt's esbriet's Rx until November 30, 2014.  Will need a Maintenance Rx for esbriet for 30 days w/ an annual refill.  Quantity for 30 days will be 270.   Can fax to [phone] to the attn of Windell MouldingRuth or can call a verbal order called into [phone].

## 2014-01-07 NOTE — Telephone Encounter (Signed)
Called spoke w/ [facility]--Celia a pharmacist. Gave VO for esbriet RX. Nothing further needed

## 2014-02-04 ENCOUNTER — Ambulatory Visit (INDEPENDENT_AMBULATORY_CARE_PROVIDER_SITE_OTHER): Payer: 59 | Admitting: Internal Medicine

## 2014-02-04 ENCOUNTER — Encounter: Payer: Self-pay | Admitting: Internal Medicine

## 2014-02-04 VITALS — BP 120/70 | HR 69 | Temp 98.1°F | Ht 72.0 in | Wt 183.0 lb

## 2014-02-04 DIAGNOSIS — J84112 Idiopathic pulmonary fibrosis: Secondary | ICD-10-CM

## 2014-02-04 DIAGNOSIS — N400 Enlarged prostate without lower urinary tract symptoms: Secondary | ICD-10-CM

## 2014-02-04 DIAGNOSIS — Z0000 Encounter for general adult medical examination without abnormal findings: Secondary | ICD-10-CM

## 2014-02-04 DIAGNOSIS — I2510 Atherosclerotic heart disease of native coronary artery without angina pectoris: Secondary | ICD-10-CM

## 2014-02-04 LAB — COMPREHENSIVE METABOLIC PANEL
ALBUMIN: 3.9 g/dL (ref 3.5–5.2)
ALK PHOS: 30 U/L — AB (ref 39–117)
ALT: 14 U/L (ref 0–53)
AST: 21 U/L (ref 0–37)
BUN: 13 mg/dL (ref 6–23)
CO2: 29 meq/L (ref 19–32)
Calcium: 9.2 mg/dL (ref 8.4–10.5)
Chloride: 105 mEq/L (ref 96–112)
Creatinine, Ser: 1 mg/dL (ref 0.4–1.5)
GFR: 81.13 mL/min (ref >60.00)
GLUCOSE: 94 mg/dL (ref 70–99)
POTASSIUM: 4.2 meq/L (ref 3.5–5.1)
SODIUM: 140 meq/L (ref 135–145)
TOTAL PROTEIN: 7.3 g/dL (ref 6.0–8.3)
Total Bilirubin: 0.5 mg/dL (ref 0.2–1.2)

## 2014-02-04 LAB — CBC WITH DIFFERENTIAL/PLATELET
BASOS ABS: 0 10*3/uL (ref 0.0–0.1)
Basophils Relative: 0.3 % (ref 0.0–3.0)
EOS ABS: 0.3 10*3/uL (ref 0.0–0.7)
EOS PCT: 4.6 % (ref 0.0–5.0)
HCT: 41.9 % (ref 39.0–52.0)
Hemoglobin: 14.1 g/dL (ref 13.0–17.0)
Lymphocytes Relative: 19.1 % (ref 12.0–46.0)
Lymphs Abs: 1.3 10*3/uL (ref 0.7–4.0)
MCHC: 33.8 g/dL (ref 30.0–36.0)
MCV: 94.8 fl (ref 78.0–100.0)
MONO ABS: 0.6 10*3/uL (ref 0.1–1.0)
Monocytes Relative: 9.1 % (ref 3.0–12.0)
NEUTROS PCT: 66.9 % (ref 43.0–77.0)
Neutro Abs: 4.4 10*3/uL (ref 1.4–7.7)
PLATELETS: 188 10*3/uL (ref 150.0–400.0)
RBC: 4.41 Mil/uL (ref 4.22–5.81)
RDW: 13.2 % (ref 11.5–15.5)
WBC: 6.6 10*3/uL (ref 4.0–10.5)

## 2014-02-04 LAB — LIPID PANEL
Cholesterol: 161 mg/dL (ref 0–200)
HDL: 38.6 mg/dL — ABNORMAL LOW (ref >39.00)
LDL CALC: 80 mg/dL (ref 0–99)
TRIGLYCERIDES: 212 mg/dL — AB (ref 0.0–149.0)
Total CHOL/HDL Ratio: 4
VLDL: 42.4 mg/dL — ABNORMAL HIGH (ref 0.0–40.0)

## 2014-02-04 LAB — T4, FREE: FREE T4: 0.65 ng/dL (ref 0.60–1.60)

## 2014-02-04 LAB — TSH: TSH: 0.72 u[IU]/mL (ref 0.35–4.50)

## 2014-02-04 MED ORDER — ZOSTER VACCINE LIVE 19400 UNT/0.65ML ~~LOC~~ SOLR: 0.6500 mL | Freq: Once | SUBCUTANEOUS | Status: DC

## 2014-02-04 NOTE — Progress Notes (Signed)
Pre visit review using our clinic review tool, if applicable. No additional management support is needed unless otherwise documented below in the visit note. 

## 2014-02-04 NOTE — Progress Notes (Signed)
Subjective:    Patient ID: Bradley May, male    DOB: Dec 10, 1950, 63 y.o.   MRN: [id number]  HPI Here for physical Has been working with Dr Shelle Ironlance about his fibrosis Ongoing cough Will get pain in RLQ of abdomen with the cough--discussed muscular cause  Did have an episode in March--worried about a heart attack Had left sided sensation of weight and some dizziness--at rest Did take nitro x 2 and it passed Will be seeing cardiologist soon Still walks regularly--not troubles with this  Still sees Dr Achilles Dunkope Due next week Feels he voids okay with the medication prn-- probably only once a week  Current Outpatient Prescriptions on File Prior to Visit  Medication Sig Dispense Refill  . aspirin 81 MG tablet Take 81 mg by mouth daily.        . CRESTOR 40 MG tablet TAKE 1 TABLET EVERY DAY  30 tablet  11  . DIOVAN 80 MG tablet TAKE 1 TABLET EVERY DAY  30 tablet  11  . metoprolol succinate (TOPROL-XL) 25 MG 24 hr tablet TAKE 1 TABLET EVERY DAY  90 tablet  3  . nitroGLYCERIN (NITROSTAT) 0.4 MG SL tablet Place 0.4 mg under the tongue every 5 (five) minutes as needed. For chest pain. May repeat 3 times       . Pirfenidone (ESBRIET) 267 MG CAPS Take 3 capsules by mouth 3 (three) times daily.  270 capsule  5  . silodosin (RAPAFLO) 8 MG CAPS capsule Take 8 mg by mouth as needed.        . traMADol (ULTRAM) 50 MG tablet Take 1 tablet (50 mg total) by mouth every 6 (six) hours as needed (for cough).  50 tablet  5   No current facility-administered medications on file prior to visit.    Allergies  Allergen Reactions  . Atorvastatin     REACTION: Headache  . Doxycycline     REACTION: Nausea    Past Medical History  Diagnosis Date  . CAD (coronary artery disease)     Dr. Daleen SquibbWall  . BPH (benign prostatic hypertrophy)     Dr. Achilles Dunkope  . Interstitial lung disease     Dr. Shelle Ironlance  . Allergic rhinitis     Past Surgical History  Procedure Laterality Date  . Knee surgery  1978    Right  .  Angioplasty  1992  . Coronary angioplasty with stent placement  01/2001    RCA  . Esophagogastroduodenoscopy  01/2001    bleeding  . Ureteral stent placement  01/2009    Dr. Achilles Dunkope    Family History  Problem Relation Age of Onset  . Coronary artery disease Father   . Diabetes Father   . Hypertension Father   . Cancer Father     prostate  . Hypertension Mother     History   Social History  . Marital Status: Married    Spouse Name: N/A    Number of Children: 1  . 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 Smoking status: Former Smoker -- 1.00 packs/day for 25 years    Types: Cigarettes    Quit date: 09/23/1990  . Smokeless tobacco: Never Used  . Alcohol Use: Yes     Comment: Social  . Drug Use: No  . Sexual Activity: Not on file   Other Topics Concern  . Not on file   Social History Narrative  . No  narrative on file   Review of Systems  Constitutional: Negative for unexpected weight change.       Wears seat belt  HENT: Negative for dental problem, hearing loss and tinnitus.        Regular with dentist  Eyes: Negative for visual disturbance.       No diplopia or unilateral vision loss  Respiratory: Positive for cough, chest tightness and shortness of breath.   Cardiovascular: Negative for chest pain, palpitations and leg swelling.  Gastrointestinal: Negative for nausea, vomiting, abdominal pain, constipation and blood in stool.       No heartburn  Endocrine: Negative for cold intolerance and heat intolerance.  Genitourinary: Negative for urgency, frequency and difficulty urinating.       No sexual problems  Musculoskeletal: Positive for arthralgias.       Mild joint aches--no meds  Skin: Negative for rash.       No suspicious lesions  Allergic/Immunologic: Negative for environmental allergies and immunocompromised state.       Some rhinorrhea still  Neurological: Positive for headaches. Negative for  dizziness, syncope, weakness and light-headedness.       Occ stress headache--tylenol helps  Hematological: Negative for adenopathy. Does not bruise/bleed easily.  Psychiatric/Behavioral: Negative for sleep disturbance and dysphoric mood. The patient is not nervous/anxious.        Objective:   Physical Exam  Constitutional: He is oriented to person, place, and time. He appears well-developed and well-nourished. No distress.  HENT:  Head: Normocephalic and atraumatic.  Right Ear: External ear normal.  Left Ear: External ear normal.  Mouth/Throat: Oropharynx is clear and moist. No oropharyngeal exudate.  Eyes: Conjunctivae and EOM are normal. Pupils are equal, round, and reactive to light.  Neck: Normal range of motion. Neck supple. No thyromegaly present.  Cardiovascular: Normal rate, regular rhythm, normal heart sounds and intact distal pulses.  Exam reveals no gallop.   No murmur heard. Pulmonary/Chest: Effort normal and breath sounds normal. No respiratory distress. He has no wheezes.  Fine crackles loud at bases and softer up about halfway  Abdominal: Soft. There is no tenderness.  Musculoskeletal: He exhibits no edema and no tenderness.  Lymphadenopathy:    He has no cervical adenopathy.  Neurological: He is alert and oriented to person, place, and time.  Skin: No rash noted. No erythema.  Psychiatric: He has a normal mood and affect. His behavior is normal.          Assessment & Plan:

## 2014-02-04 NOTE — Assessment & Plan Note (Signed)
Doing well Rx for zostavax PSA per Dr Achilles Dunkope

## 2014-02-04 NOTE — Assessment & Plan Note (Signed)
Voids well with occasional rapaflo

## 2014-02-04 NOTE — Assessment & Plan Note (Signed)
Follows with Dr Shelle Ironlance Ongoing cough

## 2014-02-04 NOTE — Assessment & Plan Note (Signed)
Past MI Brief chest pressure spell--doesn't sound ischemic Has cardiology follow up coming up

## 2014-02-09 ENCOUNTER — Ambulatory Visit: Payer: Self-pay | Admitting: Urology

## 2014-02-18 ENCOUNTER — Other Ambulatory Visit: Payer: Self-pay | Admitting: Internal Medicine

## 2014-02-21 ENCOUNTER — Encounter: Payer: Self-pay | Admitting: *Deleted

## 2014-02-21 ENCOUNTER — Ambulatory Visit (INDEPENDENT_AMBULATORY_CARE_PROVIDER_SITE_OTHER): Payer: 59 | Admitting: Cardiology

## 2014-02-21 ENCOUNTER — Encounter: Payer: Self-pay | Admitting: Cardiology

## 2014-02-21 VITALS — BP 118/68 | HR 74 | Ht 72.0 in | Wt 183.8 lb

## 2014-02-21 DIAGNOSIS — E785 Hyperlipidemia, unspecified: Secondary | ICD-10-CM

## 2014-02-21 DIAGNOSIS — J84112 Idiopathic pulmonary fibrosis: Secondary | ICD-10-CM

## 2014-02-21 DIAGNOSIS — I2510 Atherosclerotic heart disease of native coronary artery without angina pectoris: Secondary | ICD-10-CM

## 2014-02-21 NOTE — Assessment & Plan Note (Signed)
Continue statin. 

## 2014-02-21 NOTE — Patient Instructions (Signed)
Your physician wants you to follow-up in: ONE YEAR WITH DR CRENSHAW You will receive a reminder letter in the mail two months in advance. If you don't receive a letter, please call our office to schedule the follow-up appointment.   Your physician has requested that you have en exercise stress myoview. For further information please visit [email or web address]. Please follow instruction sheet, as given.   

## 2014-02-21 NOTE — Assessment & Plan Note (Signed)
Managed by pulmonary

## 2014-02-21 NOTE — Progress Notes (Signed)
HPI: 63 year old male previously followed by Dr. [name] for followup of coronary artery disease. Patient has had previous PCI of the circumflex. Last catheterization in 2002 in setting of inferior MI showed an intermediate branch of 50%. The LAD had a 50% lesion. The circumflex had a 75% lesion followed by an 80%. The right coronary artery was occluded. Ejection fraction 55%. Patient had PCI of his right coronary at that time. Nuclear study June 2011 showed an ejection fraction of 59%. Small inferior lateral infarct with mild peri-infarct ischemia treated medically. Since he was last seen, He has some dyspnea on exertion related to interstitial lung disease. In March he had an episode of left upper chest pain with arms feeling heavy. He took 2 sublingual nitroglycerin with resolution. Total duration 15 minutes. He otherwise has not had chest pain.   Current Outpatient Prescriptions  Medication Sig Dispense Refill  . aspirin 81 MG tablet Take 81 mg by mouth daily.        . CRESTOR 40 MG tablet TAKE 1 TABLET EVERY DAY  30 tablet  11  . DIOVAN 80 MG tablet TAKE 1 TABLET EVERY DAY  30 tablet  11  . metoprolol succinate (TOPROL-XL) 25 MG 24 hr tablet TAKE 1 TABLET EVERY DAY  90 tablet  3  . nitroGLYCERIN (NITROSTAT) 0.4 MG SL tablet Place 0.4 mg under the tongue every 5 (five) minutes as needed. For chest pain. May repeat 3 times       . Pirfenidone (ESBRIET) 267 MG CAPS Take 3 capsules by mouth 3 (three) times daily.  270 capsule  5  . silodosin (RAPAFLO) 8 MG CAPS capsule Take 8 mg by mouth as needed.        . traMADol (ULTRAM) 50 MG tablet Take 1 tablet (50 mg total) by mouth every 6 (six) hours as needed (for cough).  50 tablet  5  . zoster vaccine live, PF, (ZOSTAVAX) 30160 UNT/0.65ML injection Inject 19,400 Units into the skin once.  1 each  0   No current facility-administered medications for this visit.     Past Medical History  Diagnosis Date  . CAD (coronary artery disease)     Dr.  [name]  . BPH (benign prostatic hypertrophy)     Dr. [name]  . Interstitial lung disease     Dr. [name]  . Allergic rhinitis     Past Surgical History  Procedure Laterality Date  . Knee surgery  1978    Right  . Angioplasty  1992  . Coronary angioplasty with stent placement  01/2001    RCA  . Esophagogastroduodenoscopy  01/2001    bleeding  . Ureteral stent placement  01/2009    Dr. [name]    History   Social History  . Marital Status: Married    Spouse Name: N/A    Number of Children: 1  . Years of Education: N/A   Occupational History  . Risk analyst    Social History Main Topics  . Smoking status: Former Smoker -- 1.00 packs/day for 25 years    Types: Cigarettes    Quit date: 09/23/1990  . Smokeless tobacco: Never Used  . Alcohol Use: Yes     Comment: Social  . Drug Use: No  . Sexual Activity: Not on file   Other Topics Concern  . Not on file   Social History Narrative  . No narrative on file    ROS: no fevers or chills, productive cough, hemoptysis, dysphasia,  odynophagia, melena, hematochezia, dysuria, hematuria, rash, seizure activity, orthopnea, PND, pedal edema, claudication. Remaining systems are negative.  Physical Exam: Well-developed well-nourished in no acute distress.  Skin is warm and dry.  HEENT is normal.  Neck is supple.  Chest Dry basilar crackles Cardiovascular exam is regular rate and rhythm.  Abdominal exam nontender or distended. No masses palpated. Extremities show no edema. neuro grossly intact  ECG Sinus rhythm at a rate of 74. Occasional PVC. Prior inferior infarct. Right bundle branch block.

## 2014-02-21 NOTE — Assessment & Plan Note (Signed)
Continue aspirin and statin.Patient had 15 minutes of chest pain in March. Symptoms similar to previous infarct pain. Plan nuclear study for risk stratification. He has not had chest pain in the last several months.

## 2014-02-24 ENCOUNTER — Encounter: Payer: Self-pay | Admitting: Cardiology

## 2014-03-02 ENCOUNTER — Ambulatory Visit (HOSPITAL_COMMUNITY): Payer: 59 | Attending: Cardiology | Admitting: Radiology

## 2014-03-02 VITALS — BP 138/82 | Ht 72.0 in | Wt 178.0 lb

## 2014-03-02 DIAGNOSIS — R0609 Other forms of dyspnea: Secondary | ICD-10-CM | POA: Diagnosis not present

## 2014-03-02 DIAGNOSIS — I252 Old myocardial infarction: Secondary | ICD-10-CM | POA: Insufficient documentation

## 2014-03-02 DIAGNOSIS — I2510 Atherosclerotic heart disease of native coronary artery without angina pectoris: Secondary | ICD-10-CM

## 2014-03-02 DIAGNOSIS — I4510 Unspecified right bundle-branch block: Secondary | ICD-10-CM | POA: Insufficient documentation

## 2014-03-02 DIAGNOSIS — R079 Chest pain, unspecified: Secondary | ICD-10-CM | POA: Diagnosis present

## 2014-03-02 DIAGNOSIS — J449 Chronic obstructive pulmonary disease, unspecified: Secondary | ICD-10-CM | POA: Diagnosis not present

## 2014-03-02 DIAGNOSIS — Z8249 Family history of ischemic heart disease and other diseases of the circulatory system: Secondary | ICD-10-CM | POA: Insufficient documentation

## 2014-03-02 DIAGNOSIS — Z9861 Coronary angioplasty status: Secondary | ICD-10-CM | POA: Insufficient documentation

## 2014-03-02 DIAGNOSIS — I10 Essential (primary) hypertension: Secondary | ICD-10-CM | POA: Diagnosis not present

## 2014-03-02 DIAGNOSIS — R0989 Other specified symptoms and signs involving the circulatory and respiratory systems: Secondary | ICD-10-CM | POA: Insufficient documentation

## 2014-03-02 DIAGNOSIS — Z87891 Personal history of nicotine dependence: Secondary | ICD-10-CM | POA: Diagnosis not present

## 2014-03-02 DIAGNOSIS — R0602 Shortness of breath: Secondary | ICD-10-CM

## 2014-03-02 DIAGNOSIS — J4489 Other specified chronic obstructive pulmonary disease: Secondary | ICD-10-CM | POA: Insufficient documentation

## 2014-03-02 MED ORDER — TECHNETIUM TC 99M SESTAMIBI GENERIC - CARDIOLITE
30.0000 | Freq: Once | INTRAVENOUS | Status: AC | PRN
  Administered 2014-03-02: 30 via INTRAVENOUS

## 2014-03-02 MED ORDER — TECHNETIUM TC 99M SESTAMIBI GENERIC - CARDIOLITE
10.0000 | Freq: Once | INTRAVENOUS | Status: AC | PRN
  Administered 2014-03-02: 10 via INTRAVENOUS

## 2014-03-02 NOTE — Progress Notes (Signed)
[facility] SITE [address] NUCLEAR MED [address] [place], [place] [id number] [phone]    Cardiology Nuclear Med Study  Bradley May is a 63 y.o. male     MRN : [id number]     DOB: [birth year]  Procedure Date: 03/02/2014  Nuclear Med Background Indication for Stress Test:  Evaluation for Ischemia and PTCA/Stent Patency History: CAD;MI;Cath;Stent RCA;PTCA CFX;Previous Nuclear Study (02/2010) EF 59% location: small inferior lateral infart with peri-infart ischemia (low risk);COPD (interstitial lung dz) Cardiac Risk Factors: Family History - CAD, History of Smoking, Hypertension, Lipids and RBBB  Symptoms:  Chest Pain and DOE   Nuclear Pre-Procedure Caffeine/Decaff Intake:  None> 12 hrs NPO After: 6:30pm   Lungs:  clear O2 Sat: 96% on room air. IV 0.9% NS with Angio Cath:  22g  IV Site: R Antecubital x 1, tolerated well IV Started by:  Bradley HongPatsy Edwards, RN  Chest Size (in):  40 Cup Size: n/a  Height: 6' (1.829 m)  Weight:  178 lb (80.74 kg)  BMI:  Body mass index is 24.14 kg/(m^2). Tech Comments:  Patient took Diovan this am, and held Toprol x 24 hrs. Bradley HongPatsy Edwards, RN.    Nuclear Med Study 1 or 2 day study: 1 day  Stress Test Type:  Stress  Reading MD: N/A  Order Authorizing Provider:  Olga MillersBrian Crenshaw, MD  Resting Radionuclide: Technetium 8149m Sestamibi  Resting Radionuclide Dose: 11.0 mCi   Stress Radionuclide:  Technetium 4149m Sestamibi  Stress Radionuclide Dose: 33.0 mCi           Stress Protocol Rest HR: 77 Stress HR: 139  Rest BP: 138/82 Stress BP: 142/68  Exercise Time (min): 6:01 METS: 7:00   Predicted Max HR: 157 bpm % Max HR: 88.54 bpm Rate Pressure Product: [id number]   Dose of Adenosine (mg):  n/a Dose of Lexiscan: n/a mg  Dose of Atropine (mg): n/a Dose of Dobutamine: n/a mcg/kg/min (at max HR)  Stress Test Technologist: Bradley May, EMT-P  Nuclear Technologist:  Bradley May, CNMT     Rest Procedure:  Myocardial perfusion imaging was performed at  rest 45 minutes following the intravenous administration of Technetium 4549m Sestamibi. Rest ECG: NSR ICRBBB  Stress Procedure:  The patient exercised on the treadmill utilizing the Bruce Protocol for 6:01 minutes. The patient stopped due to SOB and denied any chest pain.Pt with occ PVCs/ PAT during recovery.  Technetium 749m Sestamibi was injected at peak exercise and myocardial perfusion imaging was performed after a brief delay. Stress ECG: No ishcemia short burst of SVT  QPS Raw Data Images:  Normal; no motion artifact; normal heart/lung ratio. Stress Images:  There is decreased uptake in the inferior wall. Rest Images:  There is decreased uptake in the inferior wall. Subtraction (SDS):  There is a fixed defect that is most consistent with a previous infarction. Transient Ischemic Dilatation (Normal <1.22):  0.91 Lung/Heart Ratio (Normal <0.45):  0.39  Quantitative Gated Spect Images QGS EDV:  103 ml QGS ESV:  55 ml  Impression Exercise Capacity:  Fair exercise capacity. BP Response:  Normal blood pressure response. Clinical Symptoms:  There is dyspnea. ECG Impression:  No significant ST segment change suggestive of ischemia. Comparison with Prior Nuclear Study: No images to compare  Overall Impression:  Low risk stress nuclear study Small inferolateral wall infarct at mid and basal leve with no ischemia.  Short burst of SVT  in recovery.  LV Ejection Fraction: 46%.  LV Wall Motion:  Mildly decreased EF with  inferior wall hypokinesis   Bradley May

## 2014-03-07 ENCOUNTER — Telehealth: Payer: Self-pay | Admitting: Cardiology

## 2014-03-07 NOTE — Telephone Encounter (Signed)
New message ° ° ° ° ° °Returning a nurses call °

## 2014-03-07 NOTE — Telephone Encounter (Signed)
Notified of myoview results. 

## 2014-03-11 ENCOUNTER — Other Ambulatory Visit: Payer: Self-pay | Admitting: *Deleted

## 2014-03-11 MED ORDER — VALSARTAN 80 MG PO TABS: 80.0000 mg | ORAL_TABLET | Freq: Every day | ORAL | Status: DC

## 2014-03-15 ENCOUNTER — Other Ambulatory Visit: Payer: Self-pay | Admitting: Internal Medicine

## 2014-05-03 ENCOUNTER — Other Ambulatory Visit (INDEPENDENT_AMBULATORY_CARE_PROVIDER_SITE_OTHER): Payer: 59

## 2014-05-03 ENCOUNTER — Ambulatory Visit: Payer: 59 | Admitting: Pulmonary Disease

## 2014-05-03 ENCOUNTER — Ambulatory Visit (INDEPENDENT_AMBULATORY_CARE_PROVIDER_SITE_OTHER)
Admission: RE | Admit: 2014-05-03 | Discharge: 2014-05-03 | Disposition: A | Discharge status: Home / Self Care | Payer: 59 | Source: Ambulatory Visit | Attending: Pulmonary Disease | Admitting: Pulmonary Disease

## 2014-05-03 ENCOUNTER — Encounter: Payer: Self-pay | Admitting: Pulmonary Disease

## 2014-05-03 DIAGNOSIS — J84112 Idiopathic pulmonary fibrosis: Secondary | ICD-10-CM

## 2014-05-03 DIAGNOSIS — J8410 Pulmonary fibrosis, unspecified: Secondary | ICD-10-CM

## 2014-05-03 LAB — HEPATIC FUNCTION PANEL
ALK PHOS: 29 U/L — AB (ref 39–117)
ALT: 19 U/L (ref 0–53)
AST: 20 U/L (ref 0–37)
Albumin: 3.9 g/dL (ref 3.5–5.2)
BILIRUBIN DIRECT: 0.1 mg/dL (ref 0.0–0.3)
BILIRUBIN TOTAL: 0.5 mg/dL (ref 0.2–1.2)
Total Protein: 7.5 g/dL (ref 6.0–8.3)

## 2014-05-03 NOTE — Progress Notes (Signed)
PFT done today. 

## 2014-05-03 NOTE — Progress Notes (Signed)
   Subjective:    Patient ID: Bradley May., male    DOB: 1951-05-08, 63 y.o.   MRN: [id number]  HPI The patient comes in today for followup of his known idiopathic pulmonary fibrosis. He has maintained on pirfenidone with very good tolerance, and has had no issues with diarrhea or photosensitivity. His liver function tests have remained normal. He occasionally has reflux, but it is short lived and not frequent. He feels that his breathing has declined to some degree from a year ago, and is still having a dry hacking cough that is relieved with tramadol. He continues to work full-time, and remains very active. He has had pulmonary function tests today that showed a slight decline in his total lung capacity from a year ago, but a mild improvement in his diffusion capacity.   Review of Systems  Constitutional: Negative for fever and unexpected weight change.  HENT: Negative for congestion, dental problem, ear pain, nosebleeds, postnasal drip, rhinorrhea, sinus pressure, sneezing, sore throat and trouble swallowing.   Eyes: Negative for redness and itching.  Respiratory: Positive for shortness of breath. Negative for cough, chest tightness and wheezing.   Cardiovascular: Negative for palpitations and leg swelling.  Gastrointestinal: Negative for nausea and vomiting.  Genitourinary: Negative for dysuria.  Musculoskeletal: Negative for joint swelling.  Skin: Negative for rash.  Neurological: Negative for headaches.  Hematological: Does not bruise/bleed easily.  Psychiatric/Behavioral: Negative for dysphoric mood. The patient is not nervous/anxious.        Objective:   Physical Exam Well-developed male in no acute distress Nose without purulence or discharge noted Neck without lymphadenopathy or thyromegaly Chest with crackles one third the way up bilaterally, no wheezing Cardiac exam with regular rate and rhythm Lower extremities without edema, no cyanosis Alert and oriented, moves  all 4 extremities.       Assessment & Plan:

## 2014-05-03 NOTE — Assessment & Plan Note (Signed)
The patient is doing very well with pirfenidone, and is trying to stay as active as possible. Despite this, he has seen some decline in his breathing, and has lost some total lung capacity. He understands the medication does not prevent further loss of lung function, but hopefully changes the slope of the curve. I have asked him to continue on pirfenidone, and will check liver function tests and chest x-ray today.

## 2014-05-03 NOTE — Patient Instructions (Signed)
Continue on pirfenidone, and will check liver function tests today and every 3mos. Continue tramadol as needed for cough If you are having reflux symptoms, can take prilosec 20mg , 2 each day.  If you are needing consistently, let us know and we can send in prescription for generic.  Continue to stay as active as possible.  Will check chest xray today and call you with results. followup with me again in 6mos if doing well.

## 2014-05-05 LAB — PULMONARY FUNCTION TEST
DL/VA % pred: 91 %
DL/VA: 4.23 ml/min/mmHg/L
DLCO unc % pred: 66 %
DLCO unc: 21.48 ml/min/mmHg
FEF 25-75 PRE: 3.39 L/s
FEF 25-75 Post: 4 L/sec
FEF2575-%CHANGE-POST: 18 %
FEF2575-%PRED-POST: 142 %
FEF2575-%Pred-Pre: 120 %
FEV1-%CHANGE-POST: 3 %
FEV1-%PRED-PRE: 87 %
FEV1-%Pred-Post: 89 %
FEV1-POST: 3.15 L
FEV1-Pre: 3.05 L
FEV1FVC-%CHANGE-POST: 1 %
FEV1FVC-%PRED-PRE: 110 %
FEV6-%Change-Post: 2 %
FEV6-%PRED-POST: 83 %
FEV6-%Pred-Pre: 82 %
FEV6-Post: 3.72 L
FEV6-Pre: 3.64 L
FEV6FVC-%Change-Post: 0 %
FEV6FVC-%PRED-PRE: 104 %
FEV6FVC-%Pred-Post: 104 %
FVC-%Change-Post: 1 %
FVC-%Pred-Post: 80 %
FVC-%Pred-Pre: 78 %
FVC-Post: 3.74 L
FVC-Pre: 3.68 L
POST FEV1/FVC RATIO: 84 %
POST FEV6/FVC RATIO: 99 %
Pre FEV1/FVC ratio: 83 %
Pre FEV6/FVC Ratio: 99 %
RV % pred: 59 %
RV: 1.38 L
TLC % PRED: 75 %
TLC: 5.26 L

## 2014-05-24 ENCOUNTER — Telehealth: Payer: Self-pay | Admitting: Pulmonary Disease

## 2014-05-24 MED ORDER — PIRFENIDONE 267 MG PO CAPS: 3.0000 | ORAL_CAPSULE | Freq: Three times a day (TID) | ORAL | Status: AC

## 2014-05-24 NOTE — Telephone Encounter (Signed)
Called spoke with pt. He reports he receives his Esbriet from [place]. He gave me # to call [phone]. Called that # and was advised they could not find pt in the system and they do not supply esbreit. Was not giving another #. Looked on web site and found # for [employer]. [phone]. Called spoke with [place]. I advised will give VO for esbriet. Was then transferred to the pharmacy. Spoke with [employer] Gave VO for esbriet. Pt is aware of above.  Nothing further needed

## 2014-05-25 ENCOUNTER — Other Ambulatory Visit: Payer: Self-pay | Admitting: Pulmonary Disease

## 2014-06-06 ENCOUNTER — Other Ambulatory Visit: Payer: Self-pay | Admitting: Internal Medicine

## 2014-06-15 ENCOUNTER — Other Ambulatory Visit: Payer: Self-pay | Admitting: Internal Medicine

## 2014-09-14 ENCOUNTER — Other Ambulatory Visit: Payer: Self-pay | Admitting: Pulmonary Disease

## 2014-09-15 ENCOUNTER — Telehealth: Payer: Self-pay | Admitting: Pulmonary Disease

## 2014-09-15 MED ORDER — TRAMADOL HCL 50 MG PO TABS
ORAL_TABLET | ORAL | Status: DC
Start: 2014-09-15 — End: 2014-11-03

## 2014-09-15 NOTE — Telephone Encounter (Signed)
Called and spoke with pt and he is aware of rx that has been called to his pharmacy in [place].  Pt voiced his understanding and nothing further is needed.

## 2014-09-15 NOTE — Telephone Encounter (Signed)
Patient returning call.

## 2014-09-15 NOTE — Telephone Encounter (Signed)
lmtcb for pt.  

## 2014-09-15 NOTE — Telephone Encounter (Signed)
Pt sent a refill request through MyChart for Tramadol. Medication was last filled on 05/26/14 #50.  [facility] - please advise on refill. Thanks.

## 2014-09-15 NOTE — Telephone Encounter (Signed)
Ok to fill with no refills.

## 2014-11-01 ENCOUNTER — Telehealth: Payer: Self-pay | Admitting: Pulmonary Disease

## 2014-11-01 NOTE — Telephone Encounter (Signed)
Spoke with NaSonya, nothing needed at this time. Call was just to let us know that the patient is up for renewal and that once everything straight with insurance they can proceed. Benefits are still being verified. Once verification of benefits is received, [facility] [employer] will contact our office to proceed with the renewal process. This can take 24-48 hours. Will send to Mindy to follow up on.

## 2014-11-03 ENCOUNTER — Other Ambulatory Visit (INDEPENDENT_AMBULATORY_CARE_PROVIDER_SITE_OTHER): Payer: 59

## 2014-11-03 ENCOUNTER — Ambulatory Visit (INDEPENDENT_AMBULATORY_CARE_PROVIDER_SITE_OTHER): Payer: 59 | Admitting: Pulmonary Disease

## 2014-11-03 ENCOUNTER — Encounter: Payer: Self-pay | Admitting: Pulmonary Disease

## 2014-11-03 VITALS — BP 124/84 | HR 82 | Temp 97.9°F | Ht 72.0 in | Wt 181.0 lb

## 2014-11-03 DIAGNOSIS — J84112 Idiopathic pulmonary fibrosis: Secondary | ICD-10-CM

## 2014-11-03 LAB — HEPATIC FUNCTION PANEL
ALK PHOS: 35 U/L — AB (ref 39–117)
ALT: 16 U/L (ref 0–53)
AST: 18 U/L (ref 0–37)
Albumin: 4.2 g/dL (ref 3.5–5.2)
BILIRUBIN DIRECT: 0.1 mg/dL (ref 0.0–0.3)
BILIRUBIN TOTAL: 0.4 mg/dL (ref 0.2–1.2)
TOTAL PROTEIN: 7.7 g/dL (ref 6.0–8.3)

## 2014-11-03 MED ORDER — TRAMADOL HCL 50 MG PO TABS: ORAL_TABLET | ORAL | Status: DC

## 2014-11-03 NOTE — Assessment & Plan Note (Signed)
The patient continues to do fairly well on pirfenidone for his idiopathic pulmonary fibrosis. He is having no side effects from the medication, and feels that his exertional tolerance is maintaining a stable baseline. He continues to have some cough that is well controlled with as needed tramadol. He is trying to stay as active as possible, but unfortunately cannot go to pulmonary rehabilitation because he continues to work. We have done in her toward oximetry today, and he does not desaturate significantly. Overall, I think he is doing about as well as can be expected, and I will see him back in 6 months with PFTs and a chest x-ray to compare to last year.

## 2014-11-03 NOTE — Addendum Note (Signed)
Addended by: Jaynee EaglesLEMONS, Antoria Lanza C on: 11/03/2014 10:34 AM   Modules accepted: Orders

## 2014-11-03 NOTE — Patient Instructions (Signed)
Stay on pirfenidone, and we will need to check your liver tests today.  You also need to get checked again in 3mos Continue working on your exercise program Get a pulse oximeter, and check your oxygen level periodically when you are doing exertional activity.  Let me know if below 88% Will refill your tramadol for cough Will see you back in 6mos, with chest xray and breathing studies on the same day.

## 2014-11-03 NOTE — Progress Notes (Signed)
   Subjective:    Patient ID: Bradley PeonFloyd G Wimbish Jr., male    DOB: 02/06/1951, 64 y.o.   MRN: [id number]  HPI The patient comes in today for follow-up of his known idiopathic pulmonary fibrosis. He is continuing on pirfenidone, and continues to do well without significant side effects. He is due for his liver function test today. He continues to be very active, and feels that his exertional tolerance is maintaining a fairly stable baseline. He does have cough at times, and responds very well to tramadol for cough suppression.   Review of Systems  Constitutional: Negative for fever and unexpected weight change.  HENT: Negative for congestion, dental problem, ear pain, nosebleeds, postnasal drip, rhinorrhea, sinus pressure, sneezing, sore throat and trouble swallowing.   Eyes: Negative for redness and itching.  Respiratory: Positive for cough and shortness of breath. Negative for chest tightness and wheezing.   Cardiovascular: Negative for palpitations and leg swelling.  Gastrointestinal: Negative for nausea and vomiting.  Genitourinary: Negative for dysuria.  Musculoskeletal: Negative for joint swelling.  Skin: Negative for rash.  Neurological: Negative for headaches.  Hematological: Does not bruise/bleed easily.  Psychiatric/Behavioral: Negative for dysphoric mood. The patient is not nervous/anxious.        Objective:   Physical Exam Well-developed male in no acute distress Nose without purulence or discharge noted Neck without lymphadenopathy or thyromegaly Chest with crackles one half the way up bilaterally, but no wheezes Cardiac exam with regular rate and rhythm Lower extremities without edema, no cyanosis Alert and oriented, moves all 4 extremities.       Assessment & Plan:

## 2014-11-04 NOTE — Telephone Encounter (Signed)
Spoke with Christoper FabianGriselda with [employer] regarding Esbriet PA. Esbriet PA is APPROVED through Nov 05, 2015. Approval # [id number] Beverely LowJeannie with [employer] aware of approval.   Nothing further needed as pt received Esbriet through [facility].

## 2014-11-04 NOTE — Telephone Encounter (Signed)
Have we received anything on this pt? Thanks!

## 2014-11-04 NOTE — Telephone Encounter (Signed)
Fax received stating to call for PA [phone] [facility] No ID # provided.

## 2014-11-10 ENCOUNTER — Telehealth: Payer: Self-pay | Admitting: Internal Medicine

## 2014-11-10 NOTE — Telephone Encounter (Signed)
Mellody DanceKeith  We are currently offering the follwing trials for IPF patients For those with FVC >/= 50%, DLCO >/= 30%   A1) For those on background Esbriet x 6 months. A Phase 4 trial adding Ofev. Six months only. Mainly safety data collection wtih efficacy. This study will form the base for combining these 2 drugs in the future. Side effet concners are add-on liver and gi side effects + indepdent side effects of each drug. Benefit is potential additive effect in slowing down IPF but safety needs to be established first  A2) For those on background Esbriet x 6 months - A Phase 1B trial adding Vismodegib which is an approved drug in the market for last several years and is used in advanced basal cell CA. Safe drug with largely mild-moderate side effect reversible profile of hair loss, myalgia, aguesia and weight loss. Vismodegib inhibits the Hedeghog Pathway which believed to be active in IPF. Benefit is potential additive effect in slowing down IPF but safety needs to be established first   Please let me know if you think he would benefit from participation in any one of these trials. If so, we would be happy to call him.    Thanks  Dr. Kalman ShanMurali Athena Baltz, M.D., [facility] Pulmonary and Critical Care Medicine Staff Physician [facility] Pager: [phone], If no answer or between  15:00h - 7:00h: call [phone]  11/10/2014 9:44 PM

## 2015-01-13 ENCOUNTER — Other Ambulatory Visit: Payer: Self-pay | Admitting: Internal Medicine

## 2015-02-14 ENCOUNTER — Other Ambulatory Visit: Payer: Self-pay | Admitting: Internal Medicine

## 2015-02-15 ENCOUNTER — Encounter: Payer: Self-pay | Admitting: Internal Medicine

## 2015-02-15 ENCOUNTER — Ambulatory Visit (INDEPENDENT_AMBULATORY_CARE_PROVIDER_SITE_OTHER): Payer: 59 | Admitting: Internal Medicine

## 2015-02-15 VITALS — BP 120/70 | HR 90 | Temp 97.8°F | Ht 72.0 in | Wt 183.0 lb

## 2015-02-15 DIAGNOSIS — I2510 Atherosclerotic heart disease of native coronary artery without angina pectoris: Secondary | ICD-10-CM | POA: Diagnosis not present

## 2015-02-15 DIAGNOSIS — J84112 Idiopathic pulmonary fibrosis: Secondary | ICD-10-CM

## 2015-02-15 DIAGNOSIS — N400 Enlarged prostate without lower urinary tract symptoms: Secondary | ICD-10-CM | POA: Diagnosis not present

## 2015-02-15 DIAGNOSIS — Z0000 Encounter for general adult medical examination without abnormal findings: Secondary | ICD-10-CM

## 2015-02-15 LAB — CBC WITH DIFFERENTIAL/PLATELET
BASOS ABS: 0 10*3/uL (ref 0.0–0.1)
Basophils Relative: 0.4 % (ref 0.0–3.0)
Eosinophils Absolute: 0.3 10*3/uL (ref 0.0–0.7)
Eosinophils Relative: 3.6 % (ref 0.0–5.0)
HEMATOCRIT: 45 % (ref 39.0–52.0)
Hemoglobin: 15 g/dL (ref 13.0–17.0)
LYMPHS ABS: 1.1 10*3/uL (ref 0.7–4.0)
Lymphocytes Relative: 14 % (ref 12.0–46.0)
MCHC: 33.4 g/dL (ref 30.0–36.0)
MCV: 94.2 fl (ref 78.0–100.0)
Monocytes Absolute: 0.6 10*3/uL (ref 0.1–1.0)
Monocytes Relative: 8.6 % (ref 3.0–12.0)
NEUTROS ABS: 5.6 10*3/uL (ref 1.4–7.7)
NEUTROS PCT: 73.4 % (ref 43.0–77.0)
PLATELETS: 203 10*3/uL (ref 150.0–400.0)
RBC: 4.78 Mil/uL (ref 4.22–5.81)
RDW: 13.3 % (ref 11.5–15.5)
WBC: 7.6 10*3/uL (ref 4.0–10.5)

## 2015-02-15 LAB — LIPID PANEL
Cholesterol: 180 mg/dL (ref 0–200)
HDL: 46 mg/dL (ref >39.00)
LDL CALC: 107 mg/dL — AB (ref 0–99)
NONHDL: 134
TRIGLYCERIDES: 137 mg/dL (ref 0.0–149.0)
Total CHOL/HDL Ratio: 4
VLDL: 27.4 mg/dL (ref 0.0–40.0)

## 2015-02-15 LAB — COMPREHENSIVE METABOLIC PANEL
ALBUMIN: 4.1 g/dL (ref 3.5–5.2)
ALT: 17 U/L (ref 0–53)
AST: 20 U/L (ref 0–37)
Alkaline Phosphatase: 33 U/L — ABNORMAL LOW (ref 39–117)
BUN: 10 mg/dL (ref 6–23)
CALCIUM: 9.7 mg/dL (ref 8.4–10.5)
CHLORIDE: 101 meq/L (ref 96–112)
CO2: 33 meq/L — AB (ref 19–32)
CREATININE: 0.9 mg/dL (ref 0.40–1.50)
GFR: 90.26 mL/min (ref >60.00)
GLUCOSE: 93 mg/dL (ref 70–99)
POTASSIUM: 4.3 meq/L (ref 3.5–5.1)
SODIUM: 138 meq/L (ref 135–145)
TOTAL PROTEIN: 7.7 g/dL (ref 6.0–8.3)
Total Bilirubin: 0.3 mg/dL (ref 0.2–1.2)

## 2015-02-15 LAB — T4, FREE: Free T4: 0.76 ng/dL (ref 0.60–1.60)

## 2015-02-15 NOTE — Progress Notes (Signed)
Pre visit review using our clinic review tool, if applicable. No additional management support is needed unless otherwise documented below in the visit note. 

## 2015-02-15 NOTE — Assessment & Plan Note (Signed)
Dr Achilles Dunkope checks PSA Due for colonoscopy 2020 Yearly flu shot Otherwise UTD Stays in shape as he can

## 2015-02-15 NOTE — Assessment & Plan Note (Signed)
Follows with pulmonary Will check LFTs with labs today

## 2015-02-15 NOTE — Assessment & Plan Note (Signed)
On asa, ARB, beta blocker and statin No symptoms

## 2015-02-15 NOTE — Progress Notes (Signed)
Subjective:    Patient ID: Bradley Peon., male    DOB: Jun 07, 1951, 64 y.o.   MRN: [id number]  HPI Here for physical  On the medication for the pulmonary fibrosis He doesn't notice any change He does exercise regularly---he has noticed a subtle change in exercise tolerance Ongoing cough---often awakens him at night which is new. Tramadol does help  No apparent heart trouble Sees cardiologist yearly  Is due for visit with Dr [name] Not sure he wants to go to [facility] Voiding okay on the rapaflo No recent problems with kidney stones He does utilize citrate OTC products  Current Outpatient Prescriptions on File Prior to Visit  Medication Sig Dispense Refill  . aspirin 81 MG tablet Take 81 mg by mouth daily.      . CRESTOR 40 MG tablet TAKE 1 TABLET EVERY DAY 90 tablet 3  . fluticasone (FLONASE) 50 MCG/ACT nasal spray USE TWO PUFFS INTO EACH NOSTRIL DAILY 16 g 2  . metoprolol succinate (TOPROL-XL) 25 MG 24 hr tablet TAKE 1 TABLET EVERY DAY 30 tablet 0  . nitroGLYCERIN (NITROSTAT) 0.4 MG SL tablet Place 0.4 mg under the tongue every 5 (five) minutes as needed. For chest pain. May repeat 3 times     . Pirfenidone (ESBRIET) 267 MG CAPS Take 3 capsules by mouth 3 (three) times daily. 270 capsule 5  . silodosin (RAPAFLO) 8 MG CAPS capsule Take 8 mg by mouth as needed.      . traMADol (ULTRAM) 50 MG tablet TAKE ONE TABLET EVERY SIX HOURS AS NEEDED FOR COUGH 50 tablet 5  . valsartan (DIOVAN) 80 MG tablet Take 1 tablet (80 mg total) by mouth daily. 90 tablet 3   No current facility-administered medications on file prior to visit.    Allergies  Allergen Reactions  . Atorvastatin     REACTION: Headache  . Doxycycline     REACTION: Nausea    Past Medical History  Diagnosis Date  . CAD (coronary artery disease)     Dr. [name]  . BPH (benign prostatic hypertrophy)     Dr. [name]  . Interstitial lung disease     Dr. [name]  . Allergic rhinitis     Past Surgical History    Procedure Laterality Date  . Knee surgery  1978    Right  . Angioplasty  1992  . Coronary angioplasty with stent placement  01/2001    RCA  . Esophagogastroduodenoscopy  01/2001    bleeding  . Ureteral stent placement  01/2009    Dr. [name]    Family History  Problem Relation Age of Onset  . Coronary artery disease Father   . Diabetes Father   . Hypertension Father   . Cancer Father     prostate  . Hypertension Mother     History   Social History  . Marital Status: Married    Spouse Name: N/A  . Number of Children: 1  . Years of Education: N/A   Occupational History  . Risk analyst    Social History Main Topics  . Smoking status: Former Smoker -- 1.00 packs/day for 25 years    Types: Cigarettes    Quit date: 09/23/1990  . Smokeless tobacco: Never Used  . Alcohol Use: Yes     Comment: Social  . Drug Use: No  . Sexual Activity: Not on file   Other Topics Concern  . Not on file   Social History Narrative   Review of Systems  Constitutional: Negative for fatigue and unexpected weight change.       Wears seat belt  HENT: Negative for dental problem, hearing loss and tinnitus.        Keeps up with dentist  Eyes: Negative for visual disturbance.       No diplopia or unilateral vision loss  Respiratory: Positive for cough and shortness of breath. Negative for chest tightness.   Cardiovascular: Negative for chest pain, palpitations and leg swelling.  Gastrointestinal: Negative for nausea, vomiting, abdominal pain, constipation and blood in stool.  Endocrine: Negative for polydipsia and polyuria.  Genitourinary: Negative for urgency.       Uses rapaflo if his stream slows down---then stops again for a while No sexual problems  Musculoskeletal: Negative for back pain, joint swelling and arthralgias.  Skin: Negative for rash.       No suspicious lesions Now seeing derm yearly  Allergic/Immunologic: Positive for environmental allergies. Negative for  immunocompromised state.       Mild allergy symptoms--no meds  Neurological: Positive for headaches. Negative for dizziness, syncope, weakness, light-headedness and numbness.       Occasional stress headaches--tylenol helps  Hematological: Negative for adenopathy. Does not bruise/bleed easily.  Psychiatric/Behavioral: Negative for sleep disturbance and dysphoric mood. The patient is not nervous/anxious.        Objective:   Physical Exam  Constitutional: He is oriented to person, place, and time. He appears well-developed and well-nourished. No distress.  HENT:  Head: Normocephalic and atraumatic.  Right Ear: External ear normal.  Left Ear: External ear normal.  Mouth/Throat: Oropharynx is clear and moist. No oropharyngeal exudate.  Eyes: Conjunctivae and EOM are normal. Pupils are equal, round, and reactive to light.  Neck: Normal range of motion. Neck supple. No thyromegaly present.  Cardiovascular: Normal rate, regular rhythm, normal heart sounds and intact distal pulses.  Exam reveals no gallop.   No murmur heard. Pulmonary/Chest: Effort normal and breath sounds normal. No respiratory distress. He has no wheezes.  Bibasilar dry crackles  Abdominal: Soft. There is no tenderness.  Musculoskeletal: He exhibits no edema or tenderness.  Lymphadenopathy:    He has no cervical adenopathy.  Neurological: He is alert and oriented to person, place, and time.  Skin: No rash noted. No erythema.  Psychiatric: He has a normal mood and affect. His behavior is normal.          Assessment & Plan:

## 2015-02-15 NOTE — Assessment & Plan Note (Signed)
For now, will continue to see Dr Cope---mostly for follow up of chronic stones and 1 functioning kidney Uses the rapaflo prn

## 2015-03-02 NOTE — Progress Notes (Signed)
HPI: FU coronary artery disease. Patient has had previous PCI of the circumflex. Last catheterization in 2002 in setting of inferior MI showed an intermediate branch of 50%. The LAD had a 50% lesion. The circumflex had a 75% lesion followed by an 80%. The right coronary artery was occluded. Ejection fraction 55%. Patient had PCI of his right coronary at that time. Nuclear study June 2015 showed an ejection fraction of 46%. Small inferior lateral infarct with no ischemia. Since he was last seen, the patient has dyspnea with more extreme activities but not with routine activities. It is relieved with rest. It is not associated with chest pain. There is no orthopnea, PND or pedal edema. There is no syncope or palpitations. There is no exertional chest pain.   Current Outpatient Prescriptions  Medication Sig Dispense Refill  . aspirin 81 MG tablet Take 81 mg by mouth daily.      . CRESTOR 40 MG tablet TAKE 1 TABLET EVERY DAY 90 tablet 3  . fluticasone (FLONASE) 50 MCG/ACT nasal spray USE TWO PUFFS INTO EACH NOSTRIL DAILY 16 g 2  . metoprolol succinate (TOPROL-XL) 25 MG 24 hr tablet TAKE 1 TABLET EVERY DAY 30 tablet 0  . nitroGLYCERIN (NITROSTAT) 0.4 MG SL tablet Place 0.4 mg under the tongue every 5 (five) minutes as needed. For chest pain. May repeat 3 times     . Pirfenidone (ESBRIET) 267 MG CAPS Take 3 capsules by mouth 3 (three) times daily. 270 capsule 5  . silodosin (RAPAFLO) 8 MG CAPS capsule Take 8 mg by mouth as needed.      . traMADol (ULTRAM) 50 MG tablet TAKE ONE TABLET EVERY SIX HOURS AS NEEDED FOR COUGH 50 tablet 5  . valsartan (DIOVAN) 80 MG tablet Take 1 tablet (80 mg total) by mouth daily. 90 tablet 3   No current facility-administered medications for this visit.     Past Medical History  Diagnosis Date  . CAD (coronary artery disease)     Dr. [name]  . BPH (benign prostatic hypertrophy)     Dr. [name]  . Interstitial lung disease     Dr. [name]  . Allergic rhinitis      Past Surgical History  Procedure Laterality Date  . Knee surgery  1978    Right  . Angioplasty  1992  . Coronary angioplasty with stent placement  01/2001    RCA  . Esophagogastroduodenoscopy  01/2001    bleeding  . Ureteral stent placement  01/2009    Dr. [name]    History   Social History  . Marital Status: Married    Spouse Name: N/A  . Number of Children: 1  . Years of Education: N/A   Occupational History  . Risk analyst    Social History Main Topics  . Smoking status: Former Smoker -- 1.00 packs/day for 25 years    Types: Cigarettes    Quit date: 09/23/1990  . Smokeless tobacco: Never Used  . Alcohol Use: Yes     Comment: Social  . Drug Use: No  . Sexual Activity: Not on file   Other Topics Concern  . Not on file   Social History Narrative    ROS: no fevers or chills, productive cough, hemoptysis, dysphasia, odynophagia, melena, hematochezia, dysuria, hematuria, rash, seizure activity, orthopnea, PND, pedal edema, claudication. Remaining systems are negative.  Physical Exam: Well-developed well-nourished in no acute distress.  Skin is warm and dry.  HEENT is normal.  Neck is  supple.  Chest with dry basilar crackles Cardiovascular exam is regular rate and rhythm.  Abdominal exam nontender or distended. No masses palpated. Extremities show no edema. neuro grossly intact  ECG sinus rhythm at a rate of 67.normal axis. Inferior posterior infarct.

## 2015-03-03 ENCOUNTER — Encounter: Payer: Self-pay | Admitting: Cardiology

## 2015-03-03 ENCOUNTER — Ambulatory Visit (INDEPENDENT_AMBULATORY_CARE_PROVIDER_SITE_OTHER): Payer: 59 | Admitting: Cardiology

## 2015-03-03 VITALS — BP 160/94 | HR 67 | Ht 72.0 in | Wt 182.8 lb

## 2015-03-03 DIAGNOSIS — I714 Abdominal aortic aneurysm, without rupture, unspecified: Secondary | ICD-10-CM

## 2015-03-03 DIAGNOSIS — I10 Essential (primary) hypertension: Secondary | ICD-10-CM | POA: Diagnosis not present

## 2015-03-03 DIAGNOSIS — I2510 Atherosclerotic heart disease of native coronary artery without angina pectoris: Secondary | ICD-10-CM

## 2015-03-03 NOTE — Assessment & Plan Note (Signed)
Continue aspirin and statin. 

## 2015-03-03 NOTE — Patient Instructions (Signed)
Your physician wants you to follow-up in: ONE YEAR WITH DR CRENSHAW You will receive a reminder letter in the mail two months in advance. If you don't receive a letter, please call our office to schedule the follow-up appointment.  

## 2015-03-03 NOTE — Assessment & Plan Note (Signed)
Continue statin. Lipids and liver monitored by primary care. 

## 2015-03-03 NOTE — Assessment & Plan Note (Signed)
Followed in [place].

## 2015-03-03 NOTE — Assessment & Plan Note (Signed)
Blood pressure is mildly elevated. I have asked him to follow his blood pressure at home. We will increase his medications if systolic averages greater than 130 or diastolic averages greater than 85.

## 2015-03-14 ENCOUNTER — Other Ambulatory Visit: Payer: Self-pay | Admitting: Cardiology

## 2015-03-15 ENCOUNTER — Other Ambulatory Visit: Payer: Self-pay | Admitting: Internal Medicine

## 2015-04-19 ENCOUNTER — Emergency Department (HOSPITAL_COMMUNITY)
Admission: EM | Admit: 2015-04-19 | Discharge: 2015-04-19 | Disposition: A | Discharge status: Home / Self Care | Payer: 59 | Attending: Emergency Medicine | Admitting: Emergency Medicine

## 2015-04-19 ENCOUNTER — Emergency Department (HOSPITAL_COMMUNITY): Payer: 59

## 2015-04-19 ENCOUNTER — Encounter (HOSPITAL_COMMUNITY): Payer: Self-pay | Admitting: Neurology

## 2015-04-19 DIAGNOSIS — Z79899 Other long term (current) drug therapy: Secondary | ICD-10-CM | POA: Diagnosis not present

## 2015-04-19 DIAGNOSIS — R55 Syncope and collapse: Secondary | ICD-10-CM | POA: Diagnosis present

## 2015-04-19 DIAGNOSIS — Z87891 Personal history of nicotine dependence: Secondary | ICD-10-CM | POA: Insufficient documentation

## 2015-04-19 DIAGNOSIS — I25118 Atherosclerotic heart disease of native coronary artery with other forms of angina pectoris: Secondary | ICD-10-CM

## 2015-04-19 DIAGNOSIS — I471 Supraventricular tachycardia: Secondary | ICD-10-CM

## 2015-04-19 DIAGNOSIS — I959 Hypotension, unspecified: Secondary | ICD-10-CM | POA: Diagnosis not present

## 2015-04-19 DIAGNOSIS — Z9861 Coronary angioplasty status: Secondary | ICD-10-CM | POA: Diagnosis not present

## 2015-04-19 DIAGNOSIS — R000 Tachycardia, unspecified: Secondary | ICD-10-CM

## 2015-04-19 DIAGNOSIS — Z8709 Personal history of other diseases of the respiratory system: Secondary | ICD-10-CM | POA: Insufficient documentation

## 2015-04-19 DIAGNOSIS — I2510 Atherosclerotic heart disease of native coronary artery without angina pectoris: Secondary | ICD-10-CM | POA: Diagnosis not present

## 2015-04-19 DIAGNOSIS — N400 Enlarged prostate without lower urinary tract symptoms: Secondary | ICD-10-CM | POA: Diagnosis not present

## 2015-04-19 DIAGNOSIS — Z7951 Long term (current) use of inhaled steroids: Secondary | ICD-10-CM | POA: Insufficient documentation

## 2015-04-19 DIAGNOSIS — Z7982 Long term (current) use of aspirin: Secondary | ICD-10-CM | POA: Diagnosis not present

## 2015-04-19 DIAGNOSIS — I252 Old myocardial infarction: Secondary | ICD-10-CM | POA: Insufficient documentation

## 2015-04-19 LAB — BASIC METABOLIC PANEL
Anion gap: 4 — ABNORMAL LOW (ref 5–15)
BUN: 12 mg/dL (ref 6–20)
CHLORIDE: 103 mmol/L (ref 101–111)
CO2: 30 mmol/L (ref 22–32)
Calcium: 9 mg/dL (ref 8.9–10.3)
Creatinine, Ser: 1.08 mg/dL (ref 0.61–1.24)
GFR calc non Af Amer: >60 mL/min (ref >60)
GLUCOSE: 124 mg/dL — AB (ref 65–99)
Potassium: 4.4 mmol/L (ref 3.5–5.1)
Sodium: 137 mmol/L (ref 135–145)

## 2015-04-19 LAB — CBC
HCT: 44 % (ref 39.0–52.0)
Hemoglobin: 14.8 g/dL (ref 13.0–17.0)
MCH: 32.4 pg (ref 26.0–34.0)
MCHC: 33.6 g/dL (ref 30.0–36.0)
MCV: 96.3 fL (ref 78.0–100.0)
Platelets: 201 10*3/uL (ref 150–400)
RBC: 4.57 MIL/uL (ref 4.22–5.81)
RDW: 12.5 % (ref 11.5–15.5)
WBC: 8.9 10*3/uL (ref 4.0–10.5)

## 2015-04-19 LAB — I-STAT TROPONIN, ED: Troponin i, poc: 0 ng/mL (ref 0.00–0.08)

## 2015-04-19 LAB — D-DIMER, QUANTITATIVE: D-Dimer, Quant: <0.27 ug/mL-FEU (ref 0.00–0.48)

## 2015-04-19 MED ORDER — SODIUM CHLORIDE 0.9 % IV BOLUS (SEPSIS)
500.0000 mL | Freq: Once | INTRAVENOUS | Status: AC
  Administered 2015-04-19: 500 mL via INTRAVENOUS

## 2015-04-19 MED ORDER — HEPARIN (PORCINE) IN NACL 100-0.45 UNIT/ML-% IJ SOLN
1000.0000 [IU]/h | INTRAMUSCULAR | Status: DC
  Filled 2015-04-19: qty 250

## 2015-04-19 MED ORDER — ADENOSINE 6 MG/2ML IV SOLN
INTRAVENOUS | Status: AC
  Filled 2015-04-19: qty 4

## 2015-04-19 MED ORDER — HEPARIN BOLUS VIA INFUSION
4000.0000 [IU] | Freq: Once | INTRAVENOUS | Status: DC
  Filled 2015-04-19: qty 4000

## 2015-04-19 NOTE — ED Notes (Signed)
Cardiology PA at bedside. Pt reports increasing "squeezing" in his throat. BP 76/54. Another EKG obtained.

## 2015-04-19 NOTE — ED Notes (Signed)
De-fib pads placed on pt and Zoll at bedside.

## 2015-04-19 NOTE — ED Notes (Addendum)
Dr. Graciela Husbands to bedside, performing carotid massage. Pt was in junctional tachycardia, changed to SR with massage. Reports no need for heparin, will discharge pt.

## 2015-04-19 NOTE — ED Provider Notes (Signed)
CSN: [id number]     Arrival date & time 04/19/15  1447 History   First MD Initiated Contact with Patient 04/19/15 1502     Chief Complaint  Patient presents with  . Near Syncope   (Consider location/radiation/quality/duration/timing/severity/associated sxs/prior Treatment) HPI  Patient is a 64 year old male with a history of CAD status post 2 MIs with stents and angioplasty, interstitial lung disease, and BPH presented today for near-syncope and chest pressure. Patient reports she was at work stood up began walking and began feeling dizzy. He then began having a sided chest pressure consistent with his previous MIs. Denies nausea vomiting, diaphoresis. Denies any frank syncopal episode or fall. He took 2 nitroglycerin with moderately taking his chest pressure from a 5 out of 10 to a 1 out of 10. Also took a full dose of aspirin.  Past Medical History  Diagnosis Date  . CAD (coronary artery disease)     Dr. [name]  . BPH (benign prostatic hypertrophy)     Dr. [name]  . Interstitial lung disease     Dr. [name]  . Allergic rhinitis    Past Surgical History  Procedure Laterality Date  . Knee surgery  1978    Right  . Angioplasty  1992  . Coronary angioplasty with stent placement  01/2001    RCA  . Esophagogastroduodenoscopy  01/2001    bleeding  . Ureteral stent placement  01/2009    Dr. [name]   Family History  Problem Relation Age of Onset  . Coronary artery disease Father   . Diabetes Father   . Hypertension Father   . Cancer Father     prostate  . Hypertension Mother    History  Substance Use Topics  . Smoking status: Former Smoker -- 1.00 packs/day for 25 years    Types: Cigarettes    Quit date: 09/23/1990  . Smokeless tobacco: Never Used  . Alcohol Use: Yes     Comment: Social    Review of Systems  Constitutional: Negative for fever and chills.  HENT: Negative for congestion and sore throat.   Eyes: Negative for pain.  Respiratory: Negative for cough and shortness of  breath.   Cardiovascular: Positive for chest pain. Negative for palpitations and leg swelling.  Gastrointestinal: Negative for nausea, vomiting, abdominal pain and diarrhea.  Endocrine: Negative.   Genitourinary: Negative for flank pain.  Musculoskeletal: Negative for back pain and neck pain.  Skin: Negative for rash.  Allergic/Immunologic: Negative.   Neurological: Positive for dizziness, weakness and light-headedness. Negative for tremors, seizures, syncope (near syncope positive), facial asymmetry, speech difficulty, numbness and headaches.  Psychiatric/Behavioral: Negative for confusion.   Allergies  Atorvastatin and Doxycycline  Home Medications   Prior to Admission medications   Medication Sig Start Date End Date Taking? Authorizing Provider  aspirin 81 MG tablet Take 81 mg by mouth daily.      Historical Provider, MD  CRESTOR 40 MG tablet TAKE 1 TABLET EVERY DAY 06/06/14   [name], MD  fluticasone [facility]) 50 MCG/ACT nasal spray USE TWO PUFFS INTO EACH NOSTRIL DAILY 01/13/15   [name], MD  metoprolol succinate (TOPROL-XL) 25 MG 24 hr tablet TAKE 1 TABLET EVERY DAY 03/16/15   [name], MD  nitroGLYCERIN (NITROSTAT) 0.4 MG SL tablet Place 0.4 mg under the tongue every 5 (five) minutes as needed. For chest pain. May repeat 3 times     Historical Provider, MD  Pirfenidone (ESBRIET) 267 MG CAPS Take 3 capsules by  mouth 3 (three) times daily. 05/24/14   Barbaraann Share, MD  silodosin (RAPAFLO) 8 MG CAPS capsule Take 8 mg by mouth as needed.      Historical Provider, MD  traMADol (ULTRAM) 50 MG tablet TAKE ONE TABLET EVERY SIX HOURS AS NEEDED FOR COUGH 11/03/14   Barbaraann Share, MD  valsartan (DIOVAN) 80 MG tablet TAKE 1 TABLET EVERY DAY 03/14/15   Lewayne Bunting, MD   BP 95/59 mmHg  Pulse 128  Temp(Src) 97.7 F (36.5 C) (Oral)  Resp 24  SpO2 98% Physical Exam  Constitutional: He is oriented to person, place, and time. He appears well-developed and  well-nourished.  HENT:  Head: Normocephalic and atraumatic.  Eyes: Conjunctivae and EOM are normal. Pupils are equal, round, and reactive to light.  Neck: Normal range of motion. Neck supple.  Cardiovascular: Regular rhythm, S1 normal, S2 normal, normal heart sounds and intact distal pulses.  Tachycardia present.   Pulses:      Radial pulses are 2+ on the right side, and 2+ on the left side.       Dorsalis pedis pulses are 2+ on the right side, and 2+ on the left side.  Pulmonary/Chest: Effort normal and breath sounds normal. No respiratory distress.  Diffuse coarse breath sounds bilaterally with good air movement.   Abdominal: Soft. Bowel sounds are normal. He exhibits no pulsatile midline mass. There is no tenderness. There is no rigidity, no guarding, no tenderness at McBurney's point and negative Murphy's sign.  Musculoskeletal: Normal range of motion.  Neurological: He is alert and oriented to person, place, and time. He has normal reflexes. No cranial nerve deficit.  Skin: Skin is warm and dry.    ED Course  Procedures (including critical care time) Labs Review Labs Reviewed  BASIC METABOLIC PANEL  CBC    Imaging Review No results found.   EKG Interpretation   Date/Time:  Wednesday April 19 2015 14:51:12 EDT Ventricular Rate:  127 PR Interval:    QRS Duration: 97 QT Interval:  326 QTC Calculation: 474 R Axis:   6 Text Interpretation:  Junctional tachycardia Inferior infarct, old  Posterior infarct, recent Confirmed by JACUBOWITZ  MD, SAM [phone]) on  04/19/2015 2:55:29 PM      MDM   Final diagnoses:  None   Patient is a 64 year old male with a history of CAD status post 2 MIs with stents and angioplasty, interstitial lung disease, and BPH presented today for near-syncope and chest pressure.  Patient continued to be tachycardic in the emergency department. He is also hypotensive. Given 1 L fluid bolus without remission. D-dimer negative. No leukocytosis or anemia  on basic labs. BMP with no acute metabolic derangements.EKG reading junctional tachycardia. Troponin negative.  Consulted cardiology and Dr. Graciela Husbands evaluated patient in the emergency department. Felt most likely SVT. Performed carotid massage on the patient with resolution of the tachycardia and hypotension. Patient reports feeling 100% normal afterwords.  Cardiology felt patient stable for discharge home. Advised to follow-up with PCP in 3-5 days. Given red flag return precautions which patient and wife at bedside understood.  If performed, labs, EKGs, and imaging were reviewed/interpreted by myself and my attending and incorporated into medical decision making.  Discussed pertinent finding with patient or caregiver prior to discharge with no further questions.  Immediate return precautions given and pt or caregiver reports understanding.  Pt care supervised by my attending Dr. Lynelle Doctor.   Tery Sanfilippo, MD PGY-2  Emergency Medicine  Tery Sanfilippo, MD 04/20/15 1244  Linwood Dibbles, MD 04/20/15 [phone]

## 2015-04-19 NOTE — ED Notes (Signed)
Pt reports he was sitting in a chair around 1300 and stood up while at work, when he stood he had near syncope with sudden dizziness and chest tightness. Given 324 aspirin, 2 sl nitro. BP 94/62 given 500 cc NS. Denies pain at current. AFIB with rate 140

## 2015-04-19 NOTE — ED Provider Notes (Signed)
Patient presents to emergency room with complaints of chest pain and syncope.  He has history of ACS.  Sx are better now.  Cp is 1/10   Physical Exam  BP 76/54 mmHg  Pulse 126  Temp(Src) 97.7 F (36.5 C) (Oral)  Resp 23  Wt 180 lb 5.4 oz (81.8 kg)  SpO2 99%  Physical Exam  Constitutional: He appears well-developed and well-nourished. No distress.  HENT:  Head: Normocephalic and atraumatic.  Right Ear: External ear normal.  Left Ear: External ear normal.  Eyes: Conjunctivae are normal. Right eye exhibits no discharge. Left eye exhibits no discharge. No scleral icterus.  Neck: Neck supple. No tracheal deviation present.  Cardiovascular: Normal rate, regular rhythm and normal heart sounds.   Pulmonary/Chest: Effort normal and breath sounds normal. No stridor. No respiratory distress. He has no wheezes.  Musculoskeletal: He exhibits no edema.  Neurological: He is alert. Cranial nerve deficit: no gross deficits.  Skin: Skin is warm and dry. No rash noted.  Psychiatric: He has a normal mood and affect.  Nursing note and vitals reviewed.   ED Course  Procedures CRITICAL CARE Performed by: EAVWU,JWJ Total critical care time: 30 Critical care time was exclusive of separately billable procedures and treating other patients. Critical care was necessary to treat or prevent imminent or life-threatening deterioration. Critical care was time spent personally by me on the following activities: development of treatment plan with patient and/or surrogate as well as nursing, discussions with consultants, evaluation of patient's response to treatment, examination of patient, obtaining history from patient or surrogate, ordering and performing treatments and interventions, ordering and review of laboratory studies, ordering and review of radiographic studies, pulse oximetry and re-evaluation of patient's condition.  MDM Sxx are concerning for ACS.  ?inferior posterior ischemia with his hypotension  and tachcyadia.  Doubt PE.   I saw and evaluated the patient, reviewed the resident's note and I agree with the findings and plan.   EKG Interpretation   Date/Time:  Wednesday April 19 2015 14:51:12 EDT Ventricular Rate:  127 PR Interval:    QRS Duration: 97 QT Interval:  326 QTC Calculation: 474 R Axis:   6 Text Interpretation:  Junctional tachycardia Inferior infarct, old  Posterior infarct, recent Confirmed by Ethelda Chick  MD, SAM [phone]) on  04/19/2015 2:55:29 PM            Linwood Dibbles, MD 04/20/15 1555

## 2015-04-19 NOTE — Progress Notes (Signed)
ANTICOAGULATION CONSULT NOTE - Initial Consult  Pharmacy Consult for Heparin Indication: chest pain/ACS  Allergies  Allergen Reactions  . Atorvastatin     REACTION: Headache  . Doxycycline     REACTION: Nausea    Patient Measurements: Weight: 180 lb 5.4 oz (81.8 kg)   Vital Signs: Temp: 97.7 F (36.5 C) (07/27 1450) Temp Source: Oral (07/27 1450) BP: 76/54 mmHg (07/27 1631) Pulse Rate: 126 (07/27 1631)  Labs:  Recent Labs  04/19/15 1518  HGB 14.8  HCT 44.0  PLT 201  CREATININE 1.08    Estimated Creatinine Clearance: 75.8 mL/min (by C-G formula based on Cr of 1.08).   Medical History: Past Medical History  Diagnosis Date  . CAD (coronary artery disease)     Dr. [name]  . BPH (benign prostatic hypertrophy)     Dr. [name]  . Interstitial lung disease     Dr. [name]  . Allergic rhinitis     Medications:   (Not in a hospital admission) Scheduled:   Infusions:  . sodium chloride 500 mL (04/19/15 1650)    Assessment: 64 yo male presenting with chest pain and syncope.  Patient has history of ACS.  Goal of Therapy:  Heparin level 0.3-0.7 units/ml Monitor platelets by anticoagulation protocol: Yes   Plan:  Give 4000 units bolus x 1 Start heparin infusion at 1000 units/hr Check anti-Xa level in 6 hours and daily while on heparin Continue to monitor H&H and platelets  [employer], Pharm.D., BCPS 04/19/2015,5:00 PM

## 2015-04-19 NOTE — H&P (Signed)
Patient ID: Bradley May. MRN: [id number], DOB/AGE: [birth year]   Admit date: 04/19/2015   Primary Physician: [name], MD Primary Cardiologist: Dr. Jens Som  Pt. Profile:  64 year old male with history of coronary artery disease and myocardial infarction 2 in 1992 and again in 2002, presenting with recurrent chest pain.   Problem List  Past Medical History  Diagnosis Date  . CAD (coronary artery disease)     Dr. [name]  . BPH (benign prostatic hypertrophy)     Dr. [name]  . Interstitial lung disease     Dr. [name]  . Allergic rhinitis     Past Surgical History  Procedure Laterality Date  . Knee surgery  1978    Right  . Angioplasty  1992  . Coronary angioplasty with stent placement  01/2001    RCA  . Esophagogastroduodenoscopy  01/2001    bleeding  . Ureteral stent placement  01/2009    Dr. [name]     Allergies  Allergies  Allergen Reactions  . Atorvastatin     REACTION: Headache  . Doxycycline     REACTION: Nausea    HPI  64 y/o male, followed by Dr. Jens Som, with a h/o CAD presenting with chest pain. He suffered his first MI in 53 and another inferior MI in 2002. LHC in 2002 showed an intermediate branch of 50%. The LAD had a 50% lesion. The circumflex had a 75% lesion. The right coronary artery was occluded. Ejection fraction was 55%. Patient had PCI of his right coronary at that time. Nuclear study June 2015 showed an ejection fraction of 46% with small inferior lateral infarct with no ischemia. Additional medical problems include interstitial lung disease followed by Dr. [name], HTN, HLD, BPH and AAA followed in [place].   He presents to the [facility] ED with chest pain.  Symptom onset was earlier this morning while at work. He initially felt dizzy/lightheaded while walking around his office but denies any frank syncope. He had no chest discomfort at that time. After returning to his desk, he developed resting substernal chest discomfort, 5 out of 10  in intensity. Described as pressure-like/tightness. Non-radiating. Symptoms were very similar to the chest discomfort he had with his previous MIs. He had little improvement with sublingual nitroglycerin. After symptoms continued for several minutes,  he reported to the ED for evaluation.  CXR is unremarkable. EKG shows junctional tachycardia with a rate of 120 bpm. He is also hypotensive with systolic blood pressures in the 80s and 90s. He feels a little weak but no near syncope. He denies any symptoms of infection including no fevers, chills, nausea, vomiting, diarrhea or dysuria. White count is normal and he is afebrile. BMP is normal and not suggestive of dehydration. He is receiving IVFs.     Home Medications  Prior to Admission medications   Medication Sig Start Date End Date Taking? Authorizing Provider  aspirin 81 MG tablet Take 81 mg by mouth daily.     Yes Historical Provider, MD  CRESTOR 40 MG tablet TAKE 1 TABLET EVERY DAY 06/06/14  Yes [name], MD  fluticasone [facility]) 50 MCG/ACT nasal spray USE TWO PUFFS INTO EACH NOSTRIL DAILY 01/13/15  Yes [name], MD  metoprolol succinate (TOPROL-XL) 25 MG 24 hr tablet TAKE 1 TABLET EVERY DAY 03/16/15  Yes [name], MD  nitroGLYCERIN (NITROSTAT) 0.4 MG SL tablet Place 0.4 mg under the tongue every 5 (five) minutes as needed. For chest pain. May repeat 3  times    Yes Historical Provider, MD  Pirfenidone (ESBRIET) 267 MG CAPS Take 3 capsules by mouth 3 (three) times daily. 05/24/14  Yes Barbaraann Share, MD  silodosin (RAPAFLO) 8 MG CAPS capsule Take 8 mg by mouth daily as needed. 04/07/15  Yes Historical Provider, MD  traMADol (ULTRAM) 50 MG tablet TAKE ONE TABLET EVERY SIX HOURS AS NEEDED FOR COUGH 11/03/14  Yes Barbaraann Share, MD  valsartan (DIOVAN) 80 MG tablet TAKE 1 TABLET EVERY DAY 03/14/15  Yes Lewayne Bunting, MD    Family History  Family History  Problem Relation Age of Onset  . Coronary artery disease Father   .  Diabetes Father   . Hypertension Father   . Cancer Father     prostate  . Hypertension Mother     Social History  History   Social History  . Marital Status: Married    Spouse Name: N/A  . Number of Children: 1  . Years of Education: N/A   Occupational History  . Risk analyst    Social History Main Topics  . Smoking status: Former Smoker -- 1.00 packs/day for 25 years    Types: Cigarettes    Quit date: 09/23/1990  . Smokeless tobacco: Never Used  . Alcohol Use: Yes     Comment: Social  . Drug Use: No  . Sexual Activity: Not on file   Other Topics Concern  . Not on file   Social History Narrative     Review of Systems General:  No chills, fever, night sweats or weight changes.  Cardiovascular:  No chest pain, dyspnea on exertion, edema, orthopnea, palpitations, paroxysmal nocturnal dyspnea. Dermatological: No rash, lesions/masses Respiratory: No cough, dyspnea Urologic: No hematuria, dysuria Abdominal:   No nausea, vomiting, diarrhea, bright red blood per rectum, melena, or hematemesis Neurologic:  No visual changes, wkns, changes in mental status. All other systems reviewed and are otherwise negative except as noted above.  Physical Exam  Blood pressure 90/59, pulse 129, temperature 97.7 F (36.5 C), temperature source Oral, resp. rate 25, weight 180 lb 5.4 oz (81.8 kg), SpO2 99 %.  General: Pleasant, NAD Psych: Normal affect. Neuro: Alert and oriented X 3. Moves all extremities spontaneously. HEENT: Normal  Neck: Supple without bruits or JVD. Lungs:  Resp regular and unlabored, CTA. Heart: rapdi but R RR no s3, s4, or murmurs. Abdomen: Soft, non-tender, non-distended, BS + x 4.  Extremities: No clubbing, cyanosis or edema. DP/PT/Radials 2+ and equal bilaterally.  Labs  Troponin [facility] Test)  Recent Labs  04/19/15 1529  TROPIPOC 0.00   No results for input(s): CKTOTAL, CKMB, TROPONINI in the last 72 hours. Lab Results  Component  Value Date   WBC 8.9 04/19/2015   HGB 14.8 04/19/2015   HCT 44.0 04/19/2015   MCV 96.3 04/19/2015   PLT 201 04/19/2015    Recent Labs Lab 04/19/15 1518  NA 137  K 4.4  CL 103  CO2 30  BUN 12  CREATININE 1.08  CALCIUM 9.0  GLUCOSE 124*   Lab Results  Component Value Date   CHOL 180 02/15/2015   HDL 46.00 02/15/2015   LDLCALC 107* 02/15/2015   TRIG 137.0 02/15/2015   No results found for: DDIMER   Radiology/Studies  Dg Chest Portable 1 View  04/19/2015   CLINICAL DATA:  Centralized chest pain since this morning.  EXAM: PORTABLE CHEST - 1 VIEW  COMPARISON:  05/03/2014  FINDINGS: Diffuse chronic fibrotic changes throughout the right  lung and to a lesser extent the left lung. These findings are stable since prior study. No definite acute process. Heart is normal size. No effusions or acute bony abnormality.  IMPRESSION: Stable chronic interstitial prominence and fibrotic changes in the lungs, right greater than left. No definite acute process.   Electronically Signed   By: Charlett Nose M.D.   On: 04/19/2015 15:46    ECG  SVT with p[[id number]  CArotid exam  No bruits  CSM >>SINUS  ASSESSMENT AND PLAN  1. 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  successful conversion with carotid artery massage. Symptoms resolved. HR improved as well as BP.   OK to discharge  2 Coronary Artery disease  Neg troponin, presume chest pain is secondary to SVT and not the other way around Will let Dr [facility] know and he can decide re myoview  Just done 1 year ago and was normal       Signed, SIMMONS, BRITTAINY, PA-C 04/19/2015, 4:07 PM  As above amedned  OK to discharge

## 2015-04-26 ENCOUNTER — Encounter: Payer: Self-pay | Admitting: Internal Medicine

## 2015-04-26 ENCOUNTER — Ambulatory Visit (INDEPENDENT_AMBULATORY_CARE_PROVIDER_SITE_OTHER): Payer: 59 | Admitting: Internal Medicine

## 2015-04-26 VITALS — BP 140/80 | HR 71 | Temp 98.2°F | Wt 183.0 lb

## 2015-04-26 DIAGNOSIS — I471 Supraventricular tachycardia: Secondary | ICD-10-CM

## 2015-04-26 NOTE — Assessment & Plan Note (Addendum)
Single episode converted with carotid massage EKG back to previous baseline with mild bradycardia No change in meds for now Discussed reasonable vagal maneuvers he can try at home for recurrence--but needs emergency care if persists (?try adenosine) Will copy Dr Jens Som in case he feels other action is needed

## 2015-04-26 NOTE — Progress Notes (Signed)
Subjective:    Patient ID: Bradley Peon., male    DOB: 23-Nov-1950, 64 y.o.   MRN: [id number]  HPI Here for ER follow up Had junctional tachycardia--felt to be SVT by Dr Graciela Husbands He did carotid massage and tachycardia abated Reviewed ER records  Had dizziness, felt his heart racing, pressure under sternum (like previous MI) Took 2 nitros with just slight improvement Brought to ER by EMS  No symptoms since going home No palpitations No chest pain or SOB Normal activity levels  Current Outpatient Prescriptions on File Prior to Visit  Medication Sig Dispense Refill  . aspirin 81 MG tablet Take 81 mg by mouth daily.      . CRESTOR 40 MG tablet TAKE 1 TABLET EVERY DAY 90 tablet 3  . fluticasone (FLONASE) 50 MCG/ACT nasal spray USE TWO PUFFS INTO EACH NOSTRIL DAILY 16 g 2  . metoprolol succinate (TOPROL-XL) 25 MG 24 hr tablet TAKE 1 TABLET EVERY DAY 90 tablet 3  . nitroGLYCERIN (NITROSTAT) 0.4 MG SL tablet Place 0.4 mg under the tongue every 5 (five) minutes as needed. For chest pain. May repeat 3 times     . Pirfenidone (ESBRIET) 267 MG CAPS Take 3 capsules by mouth 3 (three) times daily. 270 capsule 5  . silodosin (RAPAFLO) 8 MG CAPS capsule Take 8 mg by mouth daily as needed.    . traMADol (ULTRAM) 50 MG tablet TAKE ONE TABLET EVERY SIX HOURS AS NEEDED FOR COUGH 50 tablet 5  . valsartan (DIOVAN) 80 MG tablet TAKE 1 TABLET EVERY DAY 90 tablet 12   No current facility-administered medications on file prior to visit.    Allergies  Allergen Reactions  . Atorvastatin     REACTION: Headache  . Doxycycline     REACTION: Nausea    Past Medical History  Diagnosis Date  . CAD (coronary artery disease)     Dr. [name]  . BPH (benign prostatic hypertrophy)     Dr. [name]  . Interstitial lung disease     Dr. [name]  . Allergic rhinitis     Past Surgical History  Procedure Laterality Date  . Knee surgery  1978    Right  . Angioplasty  1992  . Coronary angioplasty with stent  placement  01/2001    RCA  . Esophagogastroduodenoscopy  01/2001    bleeding  . Ureteral stent placement  01/2009    Dr. [name]    Family History  Problem Relation Age of Onset  . Coronary artery disease Father   . Diabetes Father   . Hypertension Father   . Cancer Father     prostate  . Hypertension Mother     History   Social History  . Marital Status: Married    Spouse Name: N/A  . Number of Children: 1  . Years of Education: N/A   Occupational History  . Risk analyst    Social History Main Topics  . Smoking status: Former Smoker -- 1.00 packs/day for 25 years    Types: Cigarettes    Quit date: 09/23/1990  . Smokeless tobacco: Never Used  . Alcohol Use: Yes     Comment: Social  . Drug Use: No  . Sexual Activity: Not on file   Other Topics Concern  . Not on file   Social History Narrative   Review of Systems  Appetite is good Sleeping fine     Objective:   Physical Exam  Constitutional: He appears well-developed and well-nourished. No  distress.  Neck: Normal range of motion. Neck supple. No thyromegaly present.  Cardiovascular: Normal rate, regular rhythm, normal heart sounds and intact distal pulses.  Exam reveals no gallop.   No murmur heard. Pulmonary/Chest: Effort normal. No respiratory distress. He has no wheezes.  Loud crackles bilateral lower 1/2 of lung fields  Abdominal: Soft. He exhibits no mass. There is no tenderness.  Musculoskeletal: He exhibits no edema or tenderness.  Lymphadenopathy:    He has no cervical adenopathy.          Assessment & Plan:

## 2015-04-26 NOTE — Progress Notes (Signed)
Pre visit review using our clinic review tool, if applicable. No additional management support is needed unless otherwise documented below in the visit note. 

## 2015-05-03 ENCOUNTER — Ambulatory Visit: Payer: 59 | Admitting: Internal Medicine

## 2015-05-04 ENCOUNTER — Ambulatory Visit: Payer: 59 | Admitting: Pulmonary Disease

## 2015-05-09 ENCOUNTER — Ambulatory Visit (INDEPENDENT_AMBULATORY_CARE_PROVIDER_SITE_OTHER): Payer: 59 | Admitting: Internal Medicine

## 2015-05-09 ENCOUNTER — Encounter: Payer: Self-pay | Admitting: Internal Medicine

## 2015-05-09 ENCOUNTER — Ambulatory Visit (INDEPENDENT_AMBULATORY_CARE_PROVIDER_SITE_OTHER)
Admission: RE | Admit: 2015-05-09 | Discharge: 2015-05-09 | Disposition: A | Discharge status: Home / Self Care | Payer: 59 | Source: Ambulatory Visit | Attending: Internal Medicine | Admitting: Internal Medicine

## 2015-05-09 VITALS — BP 130/74 | HR 69 | Ht 70.0 in | Wt 185.0 lb

## 2015-05-09 DIAGNOSIS — J84112 Idiopathic pulmonary fibrosis: Secondary | ICD-10-CM

## 2015-05-09 DIAGNOSIS — R059 Cough, unspecified: Secondary | ICD-10-CM

## 2015-05-09 DIAGNOSIS — R05 Cough: Secondary | ICD-10-CM | POA: Diagnosis not present

## 2015-05-09 LAB — PULMONARY FUNCTION TEST
DL/VA % PRED: 89 %
DL/VA: 4.14 ml/min/mmHg/L
DLCO UNC % PRED: 60 %
DLCO unc: 19.41 ml/min/mmHg
FEF 25-75 PRE: 3.41 L/s
FEF 25-75 Post: 3.93 L/sec
FEF2575-%CHANGE-POST: 15 %
FEF2575-%PRED-PRE: 123 %
FEF2575-%Pred-Post: 142 %
FEV1-%Change-Post: 3 %
FEV1-%Pred-Post: 87 %
FEV1-%Pred-Pre: 84 %
FEV1-Post: 3.03 L
FEV1-Pre: 2.91 L
FEV1FVC-%Change-Post: 3 %
FEV1FVC-%PRED-PRE: 111 %
FEV6-%Change-Post: 0 %
FEV6-%Pred-Post: 79 %
FEV6-%Pred-Pre: 78 %
FEV6-Post: 3.48 L
FEV6-Pre: 3.46 L
FEV6FVC-%Change-Post: 0 %
FEV6FVC-%PRED-POST: 104 %
FEV6FVC-%Pred-Pre: 104 %
FVC-%Change-Post: 0 %
FVC-%PRED-PRE: 75 %
FVC-%Pred-Post: 75 %
FVC-POST: 3.5 L
FVC-PRE: 3.48 L
Post FEV1/FVC ratio: 87 %
Post FEV6/FVC ratio: 100 %
Pre FEV1/FVC ratio: 84 %
Pre FEV6/FVC Ratio: 100 %
RV % pred: 67 %
RV: 1.57 L
TLC % PRED: 72 %
TLC: 5.12 L

## 2015-05-09 MED ORDER — TRAMADOL HCL 50 MG PO TABS: ORAL_TABLET | ORAL | Status: DC

## 2015-05-09 MED ORDER — FAMOTIDINE 20 MG PO TABS: ORAL_TABLET | ORAL | Status: DC

## 2015-05-09 MED ORDER — PANTOPRAZOLE SODIUM 40 MG PO TBEC: 40.0000 mg | DELAYED_RELEASE_TABLET | Freq: Every day | ORAL | Status: DC

## 2015-05-09 NOTE — Progress Notes (Signed)
Subjective:     Patient ID: Bradley Peon., male   DOB: May 09, 1951,    MRN: [id number]  HPI  57 yowm quit smoking 1994  doe x 2010 dx with pf with minimal progression followed by [facility] previously  seen [facility] the [facility] by [facility] 05/09/2015    05/09/2015 1st office visit/ Leandra Vanderweele  Re PF on esbriet Chief Complaint  Patient presents with  . Follow-up    Pt here to discuss PFT results. No new complaints voiced      Not limited by breathing from desired activities    Main concern is chronic dry cough not related to ex or deep breath/ worse at hs and occ uses tramadol to control/ sense of pnds but not keeping him up at noct  No obvious day to day or daytime variability or assoc  cp or chest tightness, subjective wheeze or overt sinus or hb symptoms. No unusual exp hx or h/o childhood pna/ asthma or knowledge of premature birth.  Sleeping ok without nocturnal  or early am exacerbation  of respiratory  c/o's or need for noct saba. Also denies any obvious fluctuation of symptoms with weather or environmental changes or other aggravating or alleviating factors except as outlined above   Current Medications, Allergies, Complete Past Medical History, Past Surgical History, Family History, and Social History were reviewed in [name].  ROS  The following are not active complaints unless bolded sore throat, dysphagia, dental problems, itching, sneezing,  nasal congestion or excess/ purulent secretions, ear ache,   fever, chills, sweats, unintended wt loss, classically pleuritic or exertional cp, hemoptysis,  orthopnea pnd or leg swelling, presyncope, palpitations, abdominal pain, anorexia, nausea, vomiting, diarrhea  or change in bowel or bladder habits, change in stools or urine, dysuria,hematuria,  rash, arthralgias, visual complaints, headache, numbness, weakness or ataxia or problems with walking or coordination,  change in mood/affect or memory.        Review of  Systems     Objective:   Physical Exam amb slt hoarse wm nad  Wt Readings from Last 3 Encounters:  05/09/15 185 lb (83.915 kg)  04/26/15 183 lb (83.008 kg)  04/19/15 180 lb 5.4 oz (81.8 kg)    Vital signs reviewed   HEENT: nl dentition, turbinates, and orophanx. Nl external ear canals without cough reflex   NECK :  without JVD/Nodes/TM/ nl carotid upstrokes bilaterally   LUNGS: no acc muscle use, coarsened BV changes with  insp velcro-like  crackles bilaterally but no cough on insp   CV:  RRR  no s3 or murmur or increase in P2, no edema   ABD:  soft and nontender with nl excursion in the supine position. No bruits or organomegaly, bowel sounds nl  MS:  warm without deformities, calf tenderness, cyanosis  And bilateral mild  Clubbing hands  SKIN: warm and dry without lesions    NEURO:  alert, approp, no deficits     I personally reviewed images and agree with radiology impression as follows:  CXR:  05/09/2015 Stable changes of chronic interstitial lung disease.        Assessment:

## 2015-05-09 NOTE — Progress Notes (Signed)
PFT done today. 

## 2015-05-09 NOTE — Patient Instructions (Addendum)
Pantoprazole (protonix) 40 mg   Take  30-60 min before first meal of the day and Pepcid (famotidine)  20 mg one @  bedtime  X 6 week trial and if no benefit and still need to use tramadol daily to control the cough then ok to stop it but definitely continue the diet/ lifestyle recs since there is an association between IPF and Reflux   GERD (REFLUX)  is an extremely common cause of respiratory symptoms just like yours , many times with no obvious heartburn at all.    It can be treated with medication, but also with lifestyle changes including elevation of the head of your bed (ideally with 6 inch  bed blocks),  Smoking cessation, avoidance of late meals, excessive alcohol, and avoid fatty foods, chocolate, peppermint, colas, red wine, and acidic juices such as orange juice.  NO MINT OR MENTHOL PRODUCTS SO NO COUGH DROPS  USE SUGARLESS CANDY INSTEAD (Jolley ranchers or Stover's or Life Savers) or even ice chips will also do - the key is to swallow to prevent all throat clearing. NO OIL BASED VITAMINS - use powdered substitutes.   For drainage take chlortrimeton (chlorpheniramine) 4 mg every 4 hours available over the counter (may cause drowsiness but this may help you sleep so take 1-2 at bedtime)    Ok to take tramadol if you must to control cough  Please schedule a follow up visit in 3 months but call sooner if needed  Late add rec lfts needed

## 2015-05-13 ENCOUNTER — Encounter: Payer: Self-pay | Admitting: Internal Medicine

## 2015-05-13 NOTE — Assessment & Plan Note (Signed)
No improvement with nasal steroids, dymista, atrovent nasal (dried nose out) No change with dexilant.  No change with prednisone taper and singulair. Referral to ENT 05/2012:  Dr. Jearld Fenton.  UA unremarkable.  Allergy eval 2014:  No obvious abnormality Improved with tramadol.  - add GERD rx and 1st gen H1 05/09/2015   Despite failure to respond to dexilant strongly suspect this is not PF related as not related to ex or insp and more typical of Classic Upper airway cough syndrome, so named because it's frequently impossible to sort out how much is  CR/sinusitis with freq throat clearing (which can be related to primary GERD)   vs  causing  secondary (" extra esophageal")  GERD from wide swings in gastric pressure that occur with throat clearing, often  promoting self use of mint and menthol lozenges that reduce the lower esophageal sphincter tone and exacerbate the problem further in a cyclical fashion.   These are the same pts (now being labeled as having "irritable larynx syndrome" by some cough centers) who not infrequently have a history of having failed to tolerate ace inhibitors,  dry powder inhalers or biphosphonates or report having atypical reflux symptoms that don't respond to standard doses of PPI , and are easily confused as having aecopd or asthma flares by even experienced allergists/ pulmonologists.   First step is eliminate gerd acid and non acid and see if can get away from using tramadol and if not consider elavil or gabapentin

## 2015-05-13 NOTE — Assessment & Plan Note (Addendum)
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  CT 11/2012:  Subpleural fibrosis, honeycombing (slight progression from 2011).  PFTs   03/16/2013:  VC 3.89 (82%)  DLCO 20.64  63%  Corrects to 89%  Pirfenidone started 03/2013  (check LFT's q47mos) PFTs 04/2014:    FVC  3.68 (78%), no obst   DLCO 21.48 (66%) PFTs 05/09/2015  VC 3.55 (76%)   no osbt   DLCO 19.41 (60%) - 05/09/2015   Walked RA x 3 laps @ 185 ft each stopped due to  End of study, brisk pace, no sob  Sat 91% at end     Very indolent dz or he is responding to esbriet and doing fine x for cough which may be related to gerd  Use of PPI is associated with improved survival time and with decreased radiologic fibrosis per King's study published in AJRCCM vol 184 p1390.  Dec 2011 and also may have other beneficial effects as per the latest review in [place] vol 193 p1345 Jun 20016.  This may not always be cause and effect, but given how universally unimpressive and expensive  all the other  Drugs developed to day  have been for pf,   rec start  rx ppi / diet/ lifestyle modification and f/u with serial walking sats and lung volumes for now to put more points on the curve / establish firm baseline before considering additional measures.   I had an extended discussion with the patient reviewing all relevant studies completed to date and  lasting 15 to 20 minutes of a 25 minute visit    Each maintenance medication was reviewed in detail including most importantly the difference between maintenance and prns and under what circumstances the prns are to be triggered using an action plan format that is not reflected in the computer generated alphabetically organized AVS.    Please see instructions for details which were reviewed in writing and the patient given a copy highlighting the part that I personally wrote and discussed at today's ov.   Add  needs to return for pfts

## 2015-05-15 ENCOUNTER — Telehealth: Payer: Self-pay | Admitting: Pulmonary Disease

## 2015-05-15 NOTE — Telephone Encounter (Signed)
[facility]. Was on hold x 12 min and never received anyone and no VM either. WCB

## 2015-05-16 ENCOUNTER — Telehealth: Payer: Self-pay | Admitting: *Deleted

## 2015-05-16 DIAGNOSIS — J84112 Idiopathic pulmonary fibrosis: Secondary | ICD-10-CM

## 2015-05-16 NOTE — Telephone Encounter (Signed)
Spoke with the pt He just had PFT at last visit 05/09/15

## 2015-05-16 NOTE — Telephone Encounter (Signed)
Pt returned call [phone]

## 2015-05-16 NOTE — Telephone Encounter (Signed)
-----   Message from Nyoka Cowden, MD sent at 05/13/2015  2:43 PM EDT ----- Needs to return for pfts at his convenience (missed last set too but should be done q 3 m on esbriet per latetest recs)

## 2015-05-16 NOTE — Telephone Encounter (Signed)
[facility], held for long time, NA, no option to leave voicemail. WCB

## 2015-05-16 NOTE — Telephone Encounter (Signed)
LMTCB for the pt 

## 2015-05-17 NOTE — Telephone Encounter (Signed)
On hold for 10 minutes, no one every picked up, could not leave message

## 2015-05-18 NOTE — Telephone Encounter (Signed)
Spoke with Bradley May at [place], states that she needed a rx for esbriet but this has been received.  Nothing further needed at this time.

## 2015-05-22 ENCOUNTER — Other Ambulatory Visit (INDEPENDENT_AMBULATORY_CARE_PROVIDER_SITE_OTHER): Payer: 59

## 2015-05-22 DIAGNOSIS — J84112 Idiopathic pulmonary fibrosis: Secondary | ICD-10-CM | POA: Diagnosis not present

## 2015-05-22 LAB — HEPATIC FUNCTION PANEL
ALBUMIN: 4.3 g/dL (ref 3.5–5.2)
ALT: 13 U/L (ref 0–53)
AST: 17 U/L (ref 0–37)
Alkaline Phosphatase: 31 U/L — ABNORMAL LOW (ref 39–117)
BILIRUBIN TOTAL: 0.5 mg/dL (ref 0.2–1.2)
Bilirubin, Direct: 0.1 mg/dL (ref 0.0–0.3)
Total Protein: 8.1 g/dL (ref 6.0–8.3)

## 2015-05-23 ENCOUNTER — Telehealth: Payer: Self-pay | Admitting: Internal Medicine

## 2015-05-23 NOTE — Progress Notes (Signed)
Quick Note:  LMTCB ______ 

## 2015-05-23 NOTE — Telephone Encounter (Signed)
Per lab result note: Result Note     Call patient : Study is unremarkable, no change in recs  ---   I spoke with patient about results and he verbalized understanding and had no questions

## 2015-06-05 ENCOUNTER — Other Ambulatory Visit: Payer: Self-pay | Admitting: Internal Medicine

## 2015-08-07 ENCOUNTER — Encounter: Payer: Self-pay | Admitting: Internal Medicine

## 2015-08-07 ENCOUNTER — Ambulatory Visit (INDEPENDENT_AMBULATORY_CARE_PROVIDER_SITE_OTHER): Payer: 59 | Admitting: Internal Medicine

## 2015-08-07 VITALS — BP 130/76 | HR 82 | Ht 72.0 in | Wt 188.0 lb

## 2015-08-07 DIAGNOSIS — R05 Cough: Secondary | ICD-10-CM

## 2015-08-07 DIAGNOSIS — J84112 Idiopathic pulmonary fibrosis: Secondary | ICD-10-CM

## 2015-08-07 DIAGNOSIS — R059 Cough, unspecified: Secondary | ICD-10-CM

## 2015-08-07 NOTE — Progress Notes (Signed)
Subjective:     Patient ID: Bradley Peon., male   DOB: June 09, 1951,    MRN: [id number]    Brief patient profile:  53 yowm quit smoking 1994  doe x 2010 dx with pf with minimal progression followed by Clance previously  seen for the first time by Bradley May 05/09/2015     History of Present Illness  05/09/2015 1st office visit/ Bradley May  Re PF on esbriet Chief Complaint  Patient presents with  . Follow-up    Pt here to discuss PFT results. No new complaints voiced   Not limited by breathing from desired activities   Main concern is chronic dry cough not related to ex or deep breath/ worse at hs and occ uses tramadol to control/ sense of pnds but not keeping him up at noct rec Pantoprazole (protonix) 40 mg   Take  30-60 min before first meal of the day and Pepcid (famotidine)  20 mg one @  bedtime  X 6 week trial and if no benefit and still need to use tramadol daily to control the cough then ok to stop it but definitely continue the diet/ lifestyle recs since there is an association between IPF and Reflux  GERD diet   For drainage take chlortrimeton (chlorpheniramine) 4 mg every 4 hours available over the counter (may cause drowsiness but this may help you sleep so take 1-2 at bedtime)   Ok to take tramadol if you must to control cough Please schedule a follow up visit in 3 months but call sooner if needed  Late add rec lfts needed     08/07/2015  f/u ov/Bradley May re: PF / on esbriet since 2014   Chief Complaint  Patient presents with  . Follow-up    Pt states his breathing is doing well. Cough is unchanged. No new co's today.    doe = MMRC1 = can walk nl pace, flat grade, can't hurry or go uphills or steps s sob  = no real change  Cough is worse at hs / gerd rx / hs h2 but not hs h1 / p stirs in am  No better on gerd rx so stopped p 6 weeks and no worse sob or cough off it/ continues with lifestyle changes  Cough worse bending over as is sob    No obvious day to day or daytime variability  or assoc  cp or chest tightness, subjective wheeze or overt sinus or hb symptoms. No unusual exp hx or h/o childhood pna/ asthma or knowledge of premature birth.  Sleeping ok without nocturnal  or early am exacerbation  of respiratory  c/o's or need for noct saba. Also denies any obvious fluctuation of symptoms with weather or environmental changes or other aggravating or alleviating factors except as outlined above   Current Medications, Allergies, Complete Past Medical History, Past Surgical History, Family History, and Social History were reviewed in [name].  ROS  The following are not active complaints unless bolded sore throat, dysphagia, dental problems, itching, sneezing,  nasal congestion or excess/ purulent secretions, ear ache,   fever, chills, sweats, unintended wt loss, classically pleuritic or exertional cp, hemoptysis,  orthopnea pnd or leg swelling, presyncope, palpitations, abdominal pain, anorexia, nausea, vomiting, diarrhea  or change in bowel or bladder habits, change in stools or urine, dysuria,hematuria,  rash, arthralgias, visual complaints, headache, numbness, weakness or ataxia or problems with walking or coordination,  change in mood/affect or memory.  Objective:   Physical Exam  amb slt hoarse wm nad  08/07/2015      188   05/09/15 185 lb (83.915 kg)  04/26/15 183 lb (83.008 kg)  04/19/15 180 lb 5.4 oz (81.8 kg)    Vital signs reviewed   HEENT: nl dentition, turbinates, and orophanx. Nl external ear canals without cough reflex   NECK :  without JVD/Nodes/TM/ nl carotid upstrokes bilaterally   LUNGS: no acc muscle use, coarsened BV changes with  insp velcro-like  crackles bilaterally but no cough on insp   CV:  RRR  no s3 or murmur or increase in P2, no edema   ABD:  soft and nontender with nl excursion in the supine position. No bruits or organomegaly, bowel sounds nl  MS:  warm without deformities, calf  tenderness, cyanosis  And mild  Clubbing both hands  SKIN: warm and dry without lesions    NEURO:  alert, approp, no deficits     I personally reviewed images and agree with radiology impression as follows:  CXR:  05/09/2015 Stable changes of chronic interstitial lung disease.        Assessment:

## 2015-08-07 NOTE — Patient Instructions (Addendum)
Add pepcid 20 mg at bedtime   For drainage / throat tickle try take CHLORPHENIRAMINE  4 mg - take one every 4 hours as needed - available over the counter- may cause drowsiness so start with just a bedtime dose or two and see if the night time is any better   Please schedule a follow up visit in 3 months but call sooner if needed  Add needs lfts on return

## 2015-08-14 ENCOUNTER — Encounter: Payer: Self-pay | Admitting: Internal Medicine

## 2015-08-14 NOTE — Assessment & Plan Note (Addendum)
No improvement with nasal steroids, dymista, atrovent nasal (dried nose out) No change with dexilant.  No change with prednisone taper and singulair. Referral to ENT 05/2012:  Dr. Jearld FentonByers.  UA unremarkable.  Allergy eval 2014:  No obvious abnormality Improved with tramadol.  - add GERD rx and 1st gen H1 05/09/2015 > not taking correctly 08/07/2015 > re-instructed   Pattern strongly suggests   Classic Upper airway cough syndrome, so named because it's frequently impossible to sort out how much is  CR/sinusitis with freq throat clearing (which can be related to primary GERD)   vs  causing  secondary (" extra esophageal")  GERD from wide swings in gastric pressure that occur with throat clearing, often  promoting self use of mint and menthol lozenges that reduce the lower esophageal sphincter tone and exacerbate the problem further in a cyclical fashion.   These are the same pts (now being labeled as having "irritable larynx syndrome" by some cough centers) who not infrequently have a history of having failed to tolerate ace inhibitors,  dry powder inhalers or biphosphonates or report having atypical reflux symptoms that don't respond to standard doses of PPI , and are easily confused as having aecopd or asthma flares by even experienced allergists/ pulmonologists.   For now max rx for GERD and 1st gen H1   See instructions for specific recommendations which were reviewed directly with the patient who was given a copy with highlighter outlining the key components.

## 2015-08-14 NOTE — Assessment & Plan Note (Signed)
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  CT 11/2012:  Subpleural fibrosis, honeycombing (slight progression from 2011).  PFTs   03/16/2013:  VC 3.89 (82%)  DLCO 20.64  63%  Corrects to 89%  Pirfenidone started 03/2013  (check LFT's q213mos) PFTs 04/2014:    FVC  3.68 (78%), no obstruction, TLC 5.26 (75%), DLCO 21.48 (66%) PFTs 05/09/2015  VC 3.55 (76%) no osbt   DLCO  19.41 (60%) - 05/09/2015   Walked RA x 3 laps @ 185 ft each stopped due to  End of study, brisk pace, no sob Sat 91% at end   - 08/07/2015  Walked RA x 3 laps @ 185 ft each stopped due to  End of study, nl pace, no sob or desat  (96% at end)    Adequate control on present rx, reviewed > no change in rx needed    The cough is not typical of ipf in that  Does not occur with insp   I had an extended discussion with the patient reviewing all relevant studies completed to date and  lasting 15 to 20 minutes of a 25 minute visit    Each maintenance medication was reviewed in detail including most importantly the difference between maintenance and prns and under what circumstances the prns are to be triggered using an action plan format that is not reflected in the computer generated alphabetically organized AVS.    Please see instructions for details which were reviewed in writing and the patient given a copy highlighting the part that I personally wrote and discussed at today's ov.

## 2015-09-22 ENCOUNTER — Ambulatory Visit (INDEPENDENT_AMBULATORY_CARE_PROVIDER_SITE_OTHER): Payer: 59 | Admitting: Internal Medicine

## 2015-09-22 ENCOUNTER — Encounter: Payer: Self-pay | Admitting: Internal Medicine

## 2015-09-22 VITALS — BP 120/70 | HR 100 | Temp 97.9°F | Wt 187.0 lb

## 2015-09-22 DIAGNOSIS — J0100 Acute maxillary sinusitis, unspecified: Secondary | ICD-10-CM | POA: Diagnosis not present

## 2015-09-22 MED ORDER — AMOXICILLIN-POT CLAVULANATE 875-125 MG PO TABS: 1.0000 | ORAL_TABLET | Freq: Two times a day (BID) | ORAL | Status: DC

## 2015-09-22 NOTE — Progress Notes (Signed)
Subjective:    Patient ID: Bradley May., male    DOB: 11-28-1950, 64 y.o.   MRN: [id number]  HPI Developed sinus congestion about 5-6 days ago Then moved down to his chest This has caused worsened cough Grey green sputum--very thick (usually no or clear sputum)  No apparent fever No chills or sweats SOB is stable--no worse  Head congestion is better but still with PND No ear pain No sore throat  Tried alka seltzer plus and cough med--- may have helped some  Current Outpatient Prescriptions on File Prior to Visit  Medication Sig Dispense Refill  . aspirin 81 MG tablet Take 81 mg by mouth daily.      . CRESTOR 40 MG tablet TAKE 1 TABLET EVERY DAY 30 tablet 11  . fluticasone (FLONASE) 50 MCG/ACT nasal spray USE TWO PUFFS INTO EACH NOSTRIL DAILY 16 g 2  . metoprolol succinate (TOPROL-XL) 25 MG 24 hr tablet TAKE 1 TABLET EVERY DAY 90 tablet 3  . nitroGLYCERIN (NITROSTAT) 0.4 MG SL tablet Place 0.4 mg under the tongue every 5 (five) minutes as needed. For chest pain. May repeat 3 times     . Pirfenidone (ESBRIET) 267 MG CAPS Take 3 capsules by mouth 3 (three) times daily. 270 capsule 5  . silodosin (RAPAFLO) 8 MG CAPS capsule Take 8 mg by mouth daily as needed.    . traMADol (ULTRAM) 50 MG tablet TAKE ONE TABLET EVERY SIX HOURS AS NEEDED FOR COUGH 60 tablet 5  . valsartan (DIOVAN) 80 MG tablet TAKE 1 TABLET EVERY DAY 90 tablet 12   No current facility-administered medications on file prior to visit.    Allergies  Allergen Reactions  . Atorvastatin     REACTION: Headache  . Doxycycline     REACTION: Nausea    Past Medical History  Diagnosis Date  . CAD (coronary artery disease)     Dr. Daleen SquibbWall  . BPH (benign prostatic hypertrophy)     Dr. Achilles Dunkope  . Interstitial lung disease (HCC)     Dr. Shelle Ironlance  . Allergic rhinitis     Past Surgical History  Procedure Laterality Date  . Knee surgery  1978    Right  . Angioplasty  1992  . Coronary angioplasty with stent  placement  01/2001    RCA  . Esophagogastroduodenoscopy  01/2001    bleeding  . Ureteral stent placement  01/2009    Dr. Achilles Dunkope    Family History  Problem Relation Age of Onset  . Coronary artery disease Father   . Diabetes Father   . Hypertension Father   . Cancer Father     prostate  . Hypertension Mother     Social History   Social History  . Marital Status: Married    Spouse Name: N/A  . Number of Children: 1  . 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 Smoking status: Former Smoker -- 1.00 packs/day for 25 years    Types: Cigarettes    Quit date: 09/23/1990  . Smokeless tobacco: Never Used  . Alcohol Use: Yes     Comment: Social  . Drug Use: No  . Sexual Activity: Not on file   Other Topics Concern  . Not on file   Social History Narrative   Review of Systems  No rash No vomiting or diarrhea Appetite is okay     Objective:   Physical Exam  Constitutional: He appears well-developed. No distress.  HENT:  No sinus tenderness TMs normal Slight pharyngeal injection without tonsillar enlargement or exudates Moderate nasal inflammation  Neck: Normal range of motion. Neck supple.  Pulmonary/Chest: Effort normal. No respiratory distress. He has no wheezes.  Dry inspiratory crackles in both lower lung fields  Lymphadenopathy:    He has no cervical adenopathy.          Assessment & Plan:

## 2015-09-22 NOTE — Progress Notes (Signed)
Pre visit review using our clinic review tool, if applicable. No additional management support is needed unless otherwise documented below in the visit note. 

## 2015-09-22 NOTE — Assessment & Plan Note (Signed)
Worsening respiratory infection Still seems more head than chest--though feels it there with his IPF Picture not consistent with pneumonia Will treat with augmentin Reevaluate for systemic symptoms, increased SOB, etc

## 2015-10-09 ENCOUNTER — Encounter: Payer: Self-pay | Admitting: Cardiology

## 2015-10-18 ENCOUNTER — Ambulatory Visit (INDEPENDENT_AMBULATORY_CARE_PROVIDER_SITE_OTHER): Payer: 59 | Admitting: Family Medicine

## 2015-10-18 ENCOUNTER — Encounter: Payer: Self-pay | Admitting: Family Medicine

## 2015-10-18 VITALS — BP 128/78 | HR 75 | Temp 97.8°F | Wt 185.8 lb

## 2015-10-18 DIAGNOSIS — M545 Low back pain, unspecified: Secondary | ICD-10-CM | POA: Insufficient documentation

## 2015-10-18 MED ORDER — CYCLOBENZAPRINE HCL 10 MG PO TABS: 10.0000 mg | ORAL_TABLET | Freq: Two times a day (BID) | ORAL | Status: DC | PRN

## 2015-10-18 NOTE — Assessment & Plan Note (Signed)
Gradual onset low back pain with worsening earlier this week. Suspect muscular strain versus SI dysfunction given exam and history. Unlikely to be related to his AAA given normal vital signs, benign abdominal exam, and intact dorsalis pedis pulses. Neurovascularly intact. We'll treat with Flexeril and tramadol. Patient can increase tramadol to 50 mg twice daily as needed. Advised on heat and staying relatively active. Given return precautions.

## 2015-10-18 NOTE — Progress Notes (Signed)
Pre visit review using our clinic review tool, if applicable. No additional management support is needed unless otherwise documented below in the visit note. 

## 2015-10-18 NOTE — Progress Notes (Signed)
Patient ID: Bradley May., male   DOB: 1951/04/15, 65 y.o.   MRN: [id number]  [name], MD Phone: [phone]  Bradley May. is a 65 y.o. male who presents today for same day visit.  Back pain: Patient notes for several weeks he has had mild low back pain. This is located on the right side of his low back. Noted it was a soreness. Notes during the night on Sunday night and early morning on Monday he noted increasing soreness and discomfort in his low back when he was rolling over in bed. Notes woke up Monday morning and the soreness and pain was increased to the point it hurt to flex and extend his back. Notes he is okay if he is sitting or standing, though worse with bending. Points to the right SI joint as the area of discomfort. No radiation, numbness, weakness, bowel or bladder incontinence, fevers, saddle anesthesia, or history of cancer. He's been taking Tylenol and 25 mg of tramadol twice a day for this. Denies specific injury. No abdominal discomfort.  PMH: Former smoker. History of AAA.   ROS see history of present illness  Objective  Physical Exam Filed Vitals:   10/18/15 1559  BP: 128/78  Pulse: 75  Temp: 97.8 F (36.6 C)    Physical Exam  Constitutional: He is well-developed, well-nourished, and in no distress.  HENT:  Head: Normocephalic and atraumatic.  Cardiovascular: Normal rate, regular rhythm and normal heart sounds.   Pulmonary/Chest: Effort normal and breath sounds normal.  Abdominal: Soft. He exhibits no distension. There is no tenderness. There is no rebound and no guarding.  Musculoskeletal:  No midline spine tenderness or step off, mild right lower back muscular tenderness, no swelling or erythema in the back, 2+ DP pulses bilaterally  Neurological: He is alert.  5 out of 5 strength in bilateral quads, hamstrings, dorsiflexion, plantarflexion, sensation to light touch intact bilaterally lower extremities, 2+ patellar reflexes  Skin:  Skin is dry. He is not diaphoretic.     Assessment/Plan: Please see individual problem list.  Low back pain Gradual onset low back pain with worsening earlier this week. Suspect muscular strain versus SI dysfunction given exam and history. Unlikely to be related to his AAA given normal vital signs, benign abdominal exam, and intact dorsalis pedis pulses. Neurovascularly intact. We'll treat with Flexeril and tramadol. Patient can increase tramadol to 50 mg twice daily as needed. Advised on heat and staying relatively active. Given return precautions.    Meds ordered this encounter  Medications  . cyclobenzaprine (FLEXERIL) 10 MG tablet    Sig: Take 1 tablet (10 mg total) by mouth 2 (two) times daily as needed for muscle spasms.    Dispense:  30 tablet    Refill:  0    [name]

## 2015-10-18 NOTE — Patient Instructions (Signed)
Nice to meet you. You have strained your back.  Please take tramadol 50 mg twice daily as needed for pain and/or flexeril 10 mg twice daily as needed for pain. These may make you drowsy so beware of this the first time you take it. If you develop numbness, weakness, loss of bowel or bladder function, numbness between your legs, fevers, or worsening pain or new symptoms please seek medical attention.

## 2015-11-07 ENCOUNTER — Other Ambulatory Visit (INDEPENDENT_AMBULATORY_CARE_PROVIDER_SITE_OTHER): Payer: 59

## 2015-11-07 ENCOUNTER — Other Ambulatory Visit: Payer: Self-pay | Admitting: Internal Medicine

## 2015-11-07 ENCOUNTER — Encounter: Payer: Self-pay | Admitting: Internal Medicine

## 2015-11-07 ENCOUNTER — Ambulatory Visit (INDEPENDENT_AMBULATORY_CARE_PROVIDER_SITE_OTHER): Payer: 59 | Admitting: Internal Medicine

## 2015-11-07 VITALS — BP 120/86 | HR 75 | Ht 72.0 in | Wt 184.0 lb

## 2015-11-07 DIAGNOSIS — J84112 Idiopathic pulmonary fibrosis: Secondary | ICD-10-CM

## 2015-11-07 DIAGNOSIS — R059 Cough, unspecified: Secondary | ICD-10-CM

## 2015-11-07 DIAGNOSIS — R05 Cough: Secondary | ICD-10-CM | POA: Diagnosis not present

## 2015-11-07 LAB — CBC WITH DIFFERENTIAL/PLATELET
BASOS PCT: 0 % (ref 0.0–3.0)
Basophils Absolute: 0 10*3/uL (ref 0.0–0.1)
EOS PCT: 3 % (ref 0.0–5.0)
Eosinophils Absolute: 0.3 10*3/uL (ref 0.0–0.7)
HEMATOCRIT: 45.6 % (ref 39.0–52.0)
HEMOGLOBIN: 15.4 g/dL (ref 13.0–17.0)
LYMPHS PCT: 10.6 % — AB (ref 12.0–46.0)
Lymphs Abs: 1.1 10*3/uL (ref 0.7–4.0)
MCHC: 33.8 g/dL (ref 30.0–36.0)
MCV: 92.7 fl (ref 78.0–100.0)
MONOS PCT: 7.1 % (ref 3.0–12.0)
Monocytes Absolute: 0.7 10*3/uL (ref 0.1–1.0)
NEUTROS PCT: 79.3 % — AB (ref 43.0–77.0)
Neutro Abs: 8 10*3/uL — ABNORMAL HIGH (ref 1.4–7.7)
Platelets: 253 10*3/uL (ref 150.0–400.0)
RBC: 4.91 Mil/uL (ref 4.22–5.81)
RDW: 12.4 % (ref 11.5–15.5)
WBC: 10.1 10*3/uL (ref 4.0–10.5)

## 2015-11-07 LAB — HEPATIC FUNCTION PANEL
ALBUMIN: 4.4 g/dL (ref 3.5–5.2)
ALT: 17 U/L (ref 0–53)
AST: 20 U/L (ref 0–37)
Alkaline Phosphatase: 35 U/L — ABNORMAL LOW (ref 39–117)
BILIRUBIN TOTAL: 0.4 mg/dL (ref 0.2–1.2)
Bilirubin, Direct: 0.1 mg/dL (ref 0.0–0.3)
Total Protein: 8.4 g/dL — ABNORMAL HIGH (ref 6.0–8.3)

## 2015-11-07 MED ORDER — TRAMADOL HCL 50 MG PO TABS: ORAL_TABLET | ORAL | Status: AC

## 2015-11-07 NOTE — Progress Notes (Signed)
Subjective:     Patient ID: Bradley Peon., male   DOB: 1951-07-14,    MRN: [id number]    Brief patient profile:  63 yowm quit smoking 1994  doe x 2010 dx with pf with minimal progression followed by Clance previously  seen for the first time by [name] 05/09/2015     History of Present Illness  05/09/2015 1st office visit/ [name]  Re PF on esbriet Chief Complaint  Patient presents with  . Follow-up    Pt here to discuss PFT results. No new complaints voiced   Not limited by breathing from desired activities   Main concern is chronic dry cough not related to ex or deep breath/ worse at hs and occ uses tramadol to control/ sense of pnds but not keeping him up at noct rec Pantoprazole (protonix) 40 mg   Take  30-60 min before first meal of the day and Pepcid (famotidine)  20 mg one @  bedtime  X 6 week trial and if no benefit and still need to use tramadol daily to control the cough then ok to stop it but definitely continue the diet/ lifestyle recs since there is an association between IPF and Reflux  GERD diet   For drainage take chlortrimeton (chlorpheniramine) 4 mg every 4 hours available over the counter (may cause drowsiness but this may help you sleep so take 1-2 at bedtime)   Ok to take tramadol if you must to control cough Please schedule a follow up visit in 3 months but call sooner if needed  Late add rec lfts needed     08/07/2015  f/u ov/[name] re: PF / on esbriet since 03/2013   Chief Complaint  Patient presents with  . Follow-up    Pt states his breathing is doing well. Cough is unchanged. No new co's today.    doe = MMRC1 = can walk nl pace, flat grade, can't hurry or go uphills or steps s sob  = no real change  Cough is worse at hs / gerd rx / hs h2 but not hs h1 / p stirs in am  No better on gerd rx so stopped p 6 weeks and no worse sob or cough off it/ continues with lifestyle changes  Cough worse bending over as is sob rec Add pepcid 20 mg at bedtime  For drainage  / throat tickle try take CHLORPHENIRAMINE  4 mg - take one every 4 hours as needed - available over the counter- may cause drowsiness so start with just a bedtime dose or two and see if the night time is any better      11/07/2015  f/u ov/[name] re:  PF/ esbriet since 03/2013  Chief Complaint  Patient presents with  . Pulmonary Fibrosis    Breathing is unchanged since last OV. Reports SOB, chest tightness, wheezing and coughing.  cough is immediately at hs taking pepcid 20 mg / chlorpheniramine 4 mg at hs   No obvious day to day or daytime variability or assoc excess/ purulent sputum or mucus plugs  cp or chest tightness, subjective wheeze or overt sinus or hb symptoms. No unusual exp hx or h/o childhood pna/ asthma or knowledge of premature birth.  Sleeping ok without nocturnal  or early am exacerbation  of respiratory  c/o's or need for noct saba. Also denies any obvious fluctuation of symptoms with weather or environmental changes or other aggravating or alleviating factors except as outlined above   Current Medications, Allergies, Complete  Past Medical History, Past Surgical History, Family History, and Social History were reviewed in [name].  ROS  The following are not active complaints unless bolded sore throat, dysphagia, dental problems, itching, sneezing,  nasal congestion or excess/ purulent secretions, ear ache,   fever, chills, sweats, unintended wt loss, classically pleuritic or exertional cp, hemoptysis,  orthopnea pnd or leg swelling, presyncope, palpitations, abdominal pain, anorexia, nausea, vomiting, diarrhea  or change in bowel or bladder habits, change in stools or urine, dysuria,hematuria,  rash, arthralgias, visual complaints, headache, numbness, weakness or ataxia or problems with walking or coordination,  change in mood/affect or memory.            Objective:   Physical Exam  amb slt hoarse wm nad  11/07/2015        184  08/07/2015       188   05/09/15 185 lb (83.915 kg)  04/26/15 183 lb (83.008 kg)  04/19/15 180 lb 5.4 oz (81.8 kg)    Vital signs reviewed   HEENT: nl dentition, turbinates, and orophanx. Nl external ear canals without cough reflex   NECK :  without JVD/Nodes/TM/ nl carotid upstrokes bilaterally   LUNGS: no acc muscle use, coarsened BV changes with  insp velcro-like  crackles bilaterally but no cough on insp   CV:  RRR  no s3 or murmur or increase in P2, no edema   ABD:  soft and nontender with nl excursion in the supine position. No bruits or organomegaly, bowel sounds nl  MS:  warm without deformities, calf tenderness, cyanosis  And mild  Clubbing both hands  SKIN: warm and dry without lesions    NEURO:  alert, approp, no deficits     I personally reviewed images and agree with radiology impression as follows:  CXR:  05/09/2015 Stable changes of chronic interstitial lung disease.    Labs ordered/ reviewed:      Chemistry      Component Value Date/Time   NA 137 04/19/2015 1518   K 4.4 04/19/2015 1518   CL 103 04/19/2015 1518   CO2 30 04/19/2015 1518   BUN 12 04/19/2015 1518   CREATININE 1.08 04/19/2015 1518      Component Value Date/Time   CALCIUM 9.0 04/19/2015 1518   ALKPHOS 35* 11/07/2015 0919   AST 20 11/07/2015 0919   ALT 17 11/07/2015 0919   BILITOT 0.4 11/07/2015 0919        Lab Results  Component Value Date   WBC 10.1 11/07/2015   HGB 15.4 11/07/2015   HCT 45.6 11/07/2015   MCV 92.7 11/07/2015   PLT 253.0 11/07/2015                       Assessment:

## 2015-11-07 NOTE — Patient Instructions (Addendum)
Please remember to go to the lab department downstairs for your tests - we will call you with the results when they are available.    Try change the pepcid ac 20 mg and chlorpheniramine to 4 mg x 2 and take all  @ 1 hour before bedtime and if don't notice any change at all after 2 full weeks ok to stop   Please schedule a follow up visit in 6 months but call sooner if needed with pfts on return

## 2015-11-08 NOTE — Assessment & Plan Note (Signed)
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  CT 11/2012:  Subpleural fibrosis, honeycombing (slight progression from 2011).  PFTs   03/16/2013:  VC 3.89 (82%)  DLCO 20.64  63%  Corrects to 89%  Pirfenidone started 03/2013  (check LFT's q52mos) PFTs 04/2014:    FVC  3.68 (78%), no obstruction, TLC 5.26 (75%), DLCO 21.48 (66%) PFTs 05/09/2015  VC 3.55 (76%) no osbt   DLCO  19.41 (60%) - 05/09/2015   Walked RA x 3 laps @ 185 ft each stopped due to  End of study, brisk pace, no sob Sat 91% at end   - 08/07/2015  Walked RA x 3 laps @ 185 ft each stopped due to  End of study, nl pace, no sob or desat  (96% at end)  - 11/07/2015  Walked RA x 3 laps @ 185 ft each stopped due to  End of study, moderate pace, no sob or desat    dz appears to have stabilized or min progressed  I had an extended discussion with the patient reviewing all relevant studies completed to date and  lasting 15 to 20 minutes of a 25 minute visit    Each maintenance medication was reviewed in detail including most importantly the difference between maintenance and prns and under what circumstances the prns are to be triggered using an action plan format that is not reflected in the computer generated alphabetically organized AVS.    Please see instructions for details which were reviewed in writing and the patient given a copy highlighting the part that I personally wrote and discussed at today's ov.

## 2015-11-08 NOTE — Assessment & Plan Note (Addendum)
No improvement with nasal steroids, dymista, atrovent nasal (dried nose out) No change with dexilant.  No change with prednisone taper and singulair. Referral to ENT 05/2012:  Dr. Jearld Fenton.  UA unremarkable.  Allergy eval 2014:  No obvious abnormality Improved with tramadol.  - add GERD rx and 1st gen H1 05/09/2015  - Repeat Allergy profile 11/07/2015 >  Eos 0. /  IgE    Cough is worse at hs so I asked the patient to take the H1 and H2 blockers an hour before bedtime to see what difference if any this makes.Bradley May

## 2015-11-10 LAB — RESPIRATORY ALLERGY PROFILE REGION II ~~LOC~~
Allergen, Cedar tree, t12: <0.1 kU/L
Allergen, Comm Silver Birch, t9: <0.1 kU/L
Allergen, Cottonwood, t14: <0.1 kU/L
Allergen, D pternoyssinus,d7: <0.1 kU/L
Allergen, Mouse Urine Protein, e78: <0.1 kU/L
Bermuda Grass: <0.1 kU/L
Cladosporium Herbarum: <0.1 kU/L
Common Ragweed: <0.1 kU/L
Elm IgE: <0.1 kU/L
IgE (Immunoglobulin E), Serum: 7 kU/L (ref <115)
Penicillium Notatum: <0.1 kU/L
Sheep Sorrel IgE: <0.1 kU/L

## 2016-01-31 ENCOUNTER — Encounter: Payer: Self-pay | Admitting: Internal Medicine

## 2016-01-31 NOTE — Telephone Encounter (Signed)
MW do you have any insight on this? Thanks.

## 2016-02-05 NOTE — Telephone Encounter (Signed)
Triage/Mike  Mr Bradley May who is the chair of the local [employer] support group can help the patient Bradley PeonFloyd G Tews Jr.\ with this issue - I knwo this for sure/ However patient needs to consent (verbally) to release his email and phone number to the [employer] support group. Let me know and I can get them introduced  Thanks  Dr. Kalman ShanMurali Oryan Winterton, M.D., [facility] Pulmonary and Critical Care Medicine Staff Physician [facility] [place] [facility] Pager: [phone], If no answer or between  15:00h - 7:00h: call [phone]  02/05/2016 2:40 AM

## 2016-02-15 NOTE — Telephone Encounter (Signed)
Bradley May  I thought I answered this but I can introduce Bradley PeonFloyd G Leiphart Jr. to Bradley May who is chair of the local [employer] support group and who knows this issue very well.LEt me know . I wll have to share patient contact info with fred  Thanks  Dr. Kalman ShanMurali Carita May, M.D., [facility] Pulmonary and Critical Care Medicine Staff Physician  [facility] [place] [facility] Pager: [phone], If no answer or between  15:00h - 7:00h: call [phone]  02/15/2016 6:43 AM

## 2016-02-15 NOTE — Telephone Encounter (Signed)
Will route message to Dr. Sherene SiresWert as MR is addressing this message to him.

## 2016-02-15 NOTE — Telephone Encounter (Signed)
MW would like for you to set this up. Thanks.

## 2016-02-16 NOTE — Telephone Encounter (Signed)
Triage  Mr Rolly SalterHaley of [employer] knows this issue well. He is the local support group guy and I Can introduce patient to him and he can explain as another patient who went through it and as support group person. But patient needs to give persmission to allow me to shar his name and phone number with Mr Rolly SalterHaley via email (encrypted secure email though)  Thanks  Dr. Kalman ShanMurali Bricyn Labrada, M.D., [facility] Pulmonary and Critical Care Medicine Staff Physician [facility] [place] [facility] Pager: [phone], If no answer or between  15:00h - 7:00h: call [phone]  02/16/2016 2:09 PM

## 2016-02-20 NOTE — Telephone Encounter (Signed)
5.29.17 mychart message from pt:  Message     I am okay with sharing my name and phone number with Mr. Rolly SalterHaley.        My number is [phone].        Thank you all for your assistance with this.        Will forward to MR to make him aware of pt's authorization to give his info to Mr. Rolly SalterHaley

## 2016-02-21 NOTE — Telephone Encounter (Signed)
If he does not hear from Bradley May in 1 week he should call back

## 2016-02-27 ENCOUNTER — Ambulatory Visit (INDEPENDENT_AMBULATORY_CARE_PROVIDER_SITE_OTHER): Payer: 59 | Admitting: Internal Medicine

## 2016-02-27 ENCOUNTER — Encounter: Payer: Self-pay | Admitting: Internal Medicine

## 2016-02-27 VITALS — BP 122/90 | HR 69 | Temp 97.7°F | Ht 71.0 in | Wt 186.0 lb

## 2016-02-27 DIAGNOSIS — Z0000 Encounter for general adult medical examination without abnormal findings: Secondary | ICD-10-CM | POA: Diagnosis not present

## 2016-02-27 DIAGNOSIS — I471 Supraventricular tachycardia: Secondary | ICD-10-CM

## 2016-02-27 DIAGNOSIS — I714 Abdominal aortic aneurysm, without rupture, unspecified: Secondary | ICD-10-CM

## 2016-02-27 DIAGNOSIS — I2510 Atherosclerotic heart disease of native coronary artery without angina pectoris: Secondary | ICD-10-CM | POA: Diagnosis not present

## 2016-02-27 DIAGNOSIS — J84112 Idiopathic pulmonary fibrosis: Secondary | ICD-10-CM

## 2016-02-27 DIAGNOSIS — Z23 Encounter for immunization: Secondary | ICD-10-CM

## 2016-02-27 LAB — CBC WITH DIFFERENTIAL/PLATELET
Basophils Absolute: 0 10*3/uL (ref 0.0–0.1)
Basophils Relative: 0 % (ref 0.0–3.0)
EOS PCT: 2.5 % (ref 0.0–5.0)
Eosinophils Absolute: 0.2 10*3/uL (ref 0.0–0.7)
HCT: 45.3 % (ref 39.0–52.0)
Hemoglobin: 14.9 g/dL (ref 13.0–17.0)
LYMPHS ABS: 1 10*3/uL (ref 0.7–4.0)
Lymphocytes Relative: 10.2 % — ABNORMAL LOW (ref 12.0–46.0)
MCHC: 32.9 g/dL (ref 30.0–36.0)
MCV: 94.2 fl (ref 78.0–100.0)
MONO ABS: 0.7 10*3/uL (ref 0.1–1.0)
Monocytes Relative: 6.5 % (ref 3.0–12.0)
NEUTROS ABS: 8 10*3/uL — AB (ref 1.4–7.7)
NEUTROS PCT: 80.8 % — AB (ref 43.0–77.0)
PLATELETS: 193 10*3/uL (ref 150.0–400.0)
RBC: 4.81 Mil/uL (ref 4.22–5.81)
RDW: 13.1 % (ref 11.5–15.5)
WBC: 9.9 10*3/uL (ref 4.0–10.5)

## 2016-02-27 LAB — LIPID PANEL
CHOLESTEROL: 164 mg/dL (ref 0–200)
HDL: 45.7 mg/dL (ref >39.00)
LDL Cholesterol: 99 mg/dL (ref 0–99)
NonHDL: 118.41
Total CHOL/HDL Ratio: 4
Triglycerides: 96 mg/dL (ref 0.0–149.0)
VLDL: 19.2 mg/dL (ref 0.0–40.0)

## 2016-02-27 LAB — COMPREHENSIVE METABOLIC PANEL
ALT: 14 U/L (ref 0–53)
AST: 20 U/L (ref 0–37)
Albumin: 4.1 g/dL (ref 3.5–5.2)
Alkaline Phosphatase: 31 U/L — ABNORMAL LOW (ref 39–117)
BUN: 12 mg/dL (ref 6–23)
CO2: 31 mEq/L (ref 19–32)
Calcium: 9.4 mg/dL (ref 8.4–10.5)
Chloride: 103 mEq/L (ref 96–112)
Creatinine, Ser: 0.97 mg/dL (ref 0.40–1.50)
GFR: 82.52 mL/min (ref >60.00)
Glucose, Bld: 97 mg/dL (ref 70–99)
Potassium: 4.7 mEq/L (ref 3.5–5.1)
Sodium: 138 mEq/L (ref 135–145)
Total Bilirubin: 0.5 mg/dL (ref 0.2–1.2)
Total Protein: 7.8 g/dL (ref 6.0–8.3)

## 2016-02-27 NOTE — Progress Notes (Signed)
Pre visit review using our clinic review tool, if applicable. No additional management support is needed unless otherwise documented below in the visit note. 

## 2016-02-27 NOTE — Assessment & Plan Note (Signed)
This has been quiet On appropriate meds

## 2016-02-27 NOTE — Assessment & Plan Note (Signed)
No recurrence. 

## 2016-02-27 NOTE — Assessment & Plan Note (Signed)
Continues on treatment and fair functional status

## 2016-02-27 NOTE — Addendum Note (Signed)
Addended by: Eual FinesBRIDGES, Adonias Demore P on: 02/27/2016 09:18 AM   Modules accepted: Orders

## 2016-02-27 NOTE — Progress Notes (Signed)
Subjective:    Patient ID: Bradley PeonFloyd G Bouch Jr., male    DOB: 11-20-1950, 65 y.o.   MRN: [id number]  HPI Here for physical  He feels he has declined from the pulmonary fibrosis Gets DOE easier Notices the cough more Not clear if the medication has helped at all Tramadol for cough--helps some  No chest pain No palpitations No syncope--- will have some dizziness (like after climbing 2-3 flights of steps)  Sees Dr Wyn Quakerew annually for AAA Not close to surgery stage  [place] [facility] [place] Achilles [place]unkope No recent stones PSA okay Voids okay on the medication  Current Outpatient Prescriptions on File Prior to Visit  Medication Sig Dispense Refill  . aspirin 81 MG tablet Take 81 mg by mouth daily.      . CRESTOR 40 MG tablet TAKE 1 TABLET EVERY DAY 30 tablet 11  . fluticasone (FLONASE) 50 MCG/ACT nasal spray USE TWO PUFFS INTO EACH NOSTRIL DAILY 16 g 2  . metoprolol succinate (TOPROL-XL) 25 MG 24 hr tablet TAKE 1 TABLET EVERY DAY 90 tablet 3  . nitroGLYCERIN (NITROSTAT) 0.4 MG SL tablet Place 0.4 mg under the tongue every 5 (five) minutes as needed. For chest pain. May repeat 3 times     . Pirfenidone (ESBRIET) 267 MG CAPS Take 3 capsules by mouth 3 (three) times daily. 270 capsule 5  . silodosin (RAPAFLO) 8 MG CAPS capsule Take 8 mg by mouth daily as needed.    . traMADol (ULTRAM) 50 MG tablet TAKE ONE TABLET EVERY SIX HOURS AS NEEDED FOR COUGH 60 tablet 5  . valsartan (DIOVAN) 80 MG tablet TAKE 1 TABLET EVERY DAY 90 tablet 12   No current facility-administered medications on file prior to visit.    Allergies  Allergen Reactions  . Atorvastatin     REACTION: Headache  . Doxycycline     REACTION: Nausea    Past Medical History  Diagnosis Date  . CAD (coronary artery disease)     Dr. Daleen SquibbWall  . BPH (benign prostatic hypertrophy)     Dr. Achilles Dunkope  . Interstitial lung disease (HCC)     Dr. Shelle Ironlance  . Allergic rhinitis     Past Surgical History  Procedure Laterality  Date  . Knee surgery  1978    Right  . Angioplasty  1992  . Coronary angioplasty with stent placement  01/2001    RCA  . Esophagogastroduodenoscopy  01/2001    bleeding  . Ureteral stent placement  01/2009    Dr. Achilles Dunkope    Family History  Problem Relation Age of Onset  . Coronary artery disease Father   . Diabetes Father   . Hypertension Father   . Cancer Father     prostate  . Hypertension Mother     Social History   Social History  . Marital Status: Married    Spouse Name: N/A  . Number of Children: 1  . 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 Smoking status: Former Smoker -- 1.00 packs/day for 25 years    Types: Cigarettes    Quit date: 09/23/1990  . Smokeless tobacco: Never Used  . Alcohol Use: Yes     Comment: Social  . Drug Use: No  . Sexual Activity: Not on file   Other Topics Concern  . Not on file   Social History Narrative   Review of Systems  Constitutional: Negative  for unexpected weight change.       Wears seat belt  HENT: Negative for dental problem, hearing loss and tinnitus.        Keeps up with dentist  Eyes:       Some blurriness in right eye No diplopia or unilateral vision loss  Respiratory: Positive for cough and shortness of breath. Negative for chest tightness.   Cardiovascular: Negative for chest pain, palpitations and leg swelling.  Gastrointestinal: Negative for nausea, vomiting, abdominal pain, constipation and blood in stool.       On PPI just in case---from pulmonary  Endocrine: Negative for polydipsia and polyuria.  Genitourinary: Negative for dysuria and urgency.       No sex---no problem  Musculoskeletal: Negative for back pain, joint swelling and arthralgias.  Skin: Negative for rash.       No suspicious lesions  Allergic/Immunologic: Negative for environmental allergies and immunocompromised state.       Takes antihistamines for the cough--just in case    Neurological: Positive for dizziness. Negative for syncope, weakness, light-headedness and headaches.  Hematological: Negative for adenopathy. Does not bruise/bleed easily.  Psychiatric/Behavioral: Negative for sleep disturbance and dysphoric mood. The patient is not nervous/anxious.        Objective:   Physical Exam  Constitutional: He is oriented to person, place, and time. He appears well-developed and well-nourished. No distress.  HENT:  Head: Normocephalic and atraumatic.  Right Ear: External ear normal.  Left Ear: External ear normal.  Mouth/Throat: Oropharynx is clear and moist. No oropharyngeal exudate.  Eyes: Conjunctivae are normal. Pupils are equal, round, and reactive to light.  Neck: Normal range of motion. Neck supple. No thyromegaly present.  Cardiovascular: Normal rate, regular rhythm, normal heart sounds and intact distal pulses.  Exam reveals no gallop.   No murmur heard. Pulmonary/Chest: Effort normal. No respiratory distress. He has no wheezes.  Loud dry crackles in both bases and RML  Abdominal: Soft. He exhibits no distension. There is no tenderness. There is no rebound and no guarding.  Musculoskeletal: He exhibits no edema or tenderness.  Lymphadenopathy:    He has no cervical adenopathy.  Neurological: He is alert and oriented to person, place, and time.  Skin: No rash noted. No erythema.  Psychiatric: He has a normal mood and affect. His behavior is normal.          Assessment & Plan:

## 2016-02-27 NOTE — Assessment & Plan Note (Signed)
Fairly healthy other than lungs Colon due 2020 prevnar today Yearly flu vaccine PSA by urologist

## 2016-02-27 NOTE — Assessment & Plan Note (Signed)
Follows yearly with Dr Wyn Quakerew

## 2016-02-28 ENCOUNTER — Other Ambulatory Visit: Payer: Self-pay

## 2016-02-28 MED ORDER — NITROGLYCERIN 0.4 MG SL SUBL: 0.4000 mg | SUBLINGUAL_TABLET | SUBLINGUAL | Status: AC | PRN

## 2016-03-13 ENCOUNTER — Other Ambulatory Visit: Payer: Self-pay | Admitting: Cardiology

## 2016-03-13 ENCOUNTER — Other Ambulatory Visit: Payer: Self-pay | Admitting: Internal Medicine

## 2016-03-24 ENCOUNTER — Emergency Department: Payer: PPO

## 2016-03-24 ENCOUNTER — Encounter: Payer: Self-pay | Admitting: Emergency Medicine

## 2016-03-24 ENCOUNTER — Inpatient Hospital Stay
Admission: EM | Admit: 2016-03-24 | Discharge: 2016-03-26 | DRG: 190 | Disposition: A | Discharge status: Home / Self Care | Payer: PPO | Attending: Internal Medicine | Admitting: Internal Medicine

## 2016-03-24 DIAGNOSIS — Z9889 Other specified postprocedural states: Secondary | ICD-10-CM

## 2016-03-24 DIAGNOSIS — Z87891 Personal history of nicotine dependence: Secondary | ICD-10-CM

## 2016-03-24 DIAGNOSIS — Z7982 Long term (current) use of aspirin: Secondary | ICD-10-CM

## 2016-03-24 DIAGNOSIS — J9601 Acute respiratory failure with hypoxia: Secondary | ICD-10-CM | POA: Diagnosis present

## 2016-03-24 DIAGNOSIS — Z955 Presence of coronary angioplasty implant and graft: Secondary | ICD-10-CM

## 2016-03-24 DIAGNOSIS — Z8042 Family history of malignant neoplasm of prostate: Secondary | ICD-10-CM | POA: Diagnosis not present

## 2016-03-24 DIAGNOSIS — J84112 Idiopathic pulmonary fibrosis: Secondary | ICD-10-CM | POA: Diagnosis present

## 2016-03-24 DIAGNOSIS — Z8249 Family history of ischemic heart disease and other diseases of the circulatory system: Secondary | ICD-10-CM

## 2016-03-24 DIAGNOSIS — R739 Hyperglycemia, unspecified: Secondary | ICD-10-CM | POA: Diagnosis present

## 2016-03-24 DIAGNOSIS — Z888 Allergy status to other drugs, medicaments and biological substances status: Secondary | ICD-10-CM

## 2016-03-24 DIAGNOSIS — N400 Enlarged prostate without lower urinary tract symptoms: Secondary | ICD-10-CM | POA: Diagnosis present

## 2016-03-24 DIAGNOSIS — J209 Acute bronchitis, unspecified: Secondary | ICD-10-CM

## 2016-03-24 DIAGNOSIS — I2510 Atherosclerotic heart disease of native coronary artery without angina pectoris: Secondary | ICD-10-CM | POA: Diagnosis present

## 2016-03-24 DIAGNOSIS — J441 Chronic obstructive pulmonary disease with (acute) exacerbation: Secondary | ICD-10-CM

## 2016-03-24 DIAGNOSIS — Z7951 Long term (current) use of inhaled steroids: Secondary | ICD-10-CM

## 2016-03-24 DIAGNOSIS — Z833 Family history of diabetes mellitus: Secondary | ICD-10-CM | POA: Diagnosis not present

## 2016-03-24 DIAGNOSIS — J440 Chronic obstructive pulmonary disease with acute lower respiratory infection: Principal | ICD-10-CM | POA: Diagnosis present

## 2016-03-24 DIAGNOSIS — Z79899 Other long term (current) drug therapy: Secondary | ICD-10-CM

## 2016-03-24 HISTORY — DX: Idiopathic pulmonary fibrosis: J84.112

## 2016-03-24 LAB — BLOOD GAS, VENOUS
ACID-BASE EXCESS: 3 mmol/L (ref 0.0–3.0)
Bicarbonate: 28.5 mEq/L — ABNORMAL HIGH (ref 21.0–28.0)
FIO2: 0.28
O2 Saturation: 78.6 %
PCO2 VEN: 46 mmHg (ref 44.0–60.0)
PH VEN: 7.4 (ref 7.320–7.430)
Patient temperature: 37
pO2, Ven: 43 mmHg (ref 31.0–45.0)

## 2016-03-24 LAB — EXPECTORATED SPUTUM ASSESSMENT W REFEX TO RESP CULTURE

## 2016-03-24 LAB — HEMOGLOBIN A1C: HEMOGLOBIN A1C: 5.7 % (ref 4.0–6.0)

## 2016-03-24 LAB — CBC WITH DIFFERENTIAL/PLATELET
BASOS ABS: 0 10*3/uL (ref 0–0.1)
Basophils Relative: 0 %
Eosinophils Absolute: 0.2 10*3/uL (ref 0–0.7)
Eosinophils Relative: 3 %
HEMATOCRIT: 40.6 % (ref 40.0–52.0)
Hemoglobin: 13.8 g/dL (ref 13.0–18.0)
LYMPHS ABS: 0.7 10*3/uL — AB (ref 1.0–3.6)
LYMPHS PCT: 8 %
MCH: 31.7 pg (ref 26.0–34.0)
MCHC: 33.9 g/dL (ref 32.0–36.0)
MCV: 93.3 fL (ref 80.0–100.0)
MONO ABS: 0.9 10*3/uL (ref 0.2–1.0)
MONOS PCT: 10 %
NEUTROS ABS: 7.2 10*3/uL — AB (ref 1.4–6.5)
Neutrophils Relative %: 79 %
Platelets: 185 10*3/uL (ref 150–440)
RBC: 4.35 MIL/uL — ABNORMAL LOW (ref 4.40–5.90)
RDW: 13 % (ref 11.5–14.5)
WBC: 9.1 10*3/uL (ref 3.8–10.6)

## 2016-03-24 LAB — BASIC METABOLIC PANEL
ANION GAP: 7 (ref 5–15)
BUN: 12 mg/dL (ref 6–20)
CHLORIDE: 103 mmol/L (ref 101–111)
CO2: 26 mmol/L (ref 22–32)
Calcium: 9.1 mg/dL (ref 8.9–10.3)
Creatinine, Ser: 0.92 mg/dL (ref 0.61–1.24)
GFR calc Af Amer: >60 mL/min (ref >60)
GFR calc non Af Amer: >60 mL/min (ref >60)
GLUCOSE: 110 mg/dL — AB (ref 65–99)
POTASSIUM: 4.1 mmol/L (ref 3.5–5.1)
Sodium: 136 mmol/L (ref 135–145)

## 2016-03-24 LAB — EXPECTORATED SPUTUM ASSESSMENT W GRAM STAIN, RFLX TO RESP C

## 2016-03-24 LAB — TROPONIN I
Troponin I: <0.03 ng/mL (ref <0.03)
Troponin I: <0.03 ng/mL (ref <0.03)
Troponin I: <0.03 ng/mL (ref <0.03)

## 2016-03-24 LAB — MAGNESIUM: MAGNESIUM: 1.9 mg/dL (ref 1.7–2.4)

## 2016-03-24 MED ORDER — TAMSULOSIN HCL 0.4 MG PO CAPS
0.4000 mg | ORAL_CAPSULE | Freq: Every day | ORAL | Status: DC
  Administered 2016-03-25 – 2016-03-26 (×2): 0.4 mg via ORAL
  Filled 2016-03-24 (×3): qty 1

## 2016-03-24 MED ORDER — ALBUTEROL SULFATE (2.5 MG/3ML) 0.083% IN NEBU: 2.5000 mg | INHALATION_SOLUTION | RESPIRATORY_TRACT | Status: DC

## 2016-03-24 MED ORDER — IPRATROPIUM-ALBUTEROL 0.5-2.5 (3) MG/3ML IN SOLN
3.0000 mL | RESPIRATORY_TRACT | Status: DC
  Administered 2016-03-24 – 2016-03-26 (×12): 3 mL via RESPIRATORY_TRACT
  Filled 2016-03-24 (×12): qty 3

## 2016-03-24 MED ORDER — ONDANSETRON HCL 4 MG/2ML IJ SOLN: 4.0000 mg | Freq: Four times a day (QID) | INTRAMUSCULAR | Status: DC | PRN

## 2016-03-24 MED ORDER — METOPROLOL SUCCINATE ER 25 MG PO TB24
25.0000 mg | ORAL_TABLET | Freq: Every day | ORAL | Status: DC
  Administered 2016-03-25: 10:00:00 25 mg via ORAL
  Filled 2016-03-24: qty 1

## 2016-03-24 MED ORDER — GUAIFENESIN ER 600 MG PO TB12
600.0000 mg | ORAL_TABLET | Freq: Two times a day (BID) | ORAL | Status: DC
  Administered 2016-03-24 – 2016-03-26 (×5): 600 mg via ORAL
  Filled 2016-03-24 (×5): qty 1

## 2016-03-24 MED ORDER — METHYLPREDNISOLONE SODIUM SUCC 125 MG IJ SOLR
125.0000 mg | Freq: Once | INTRAMUSCULAR | Status: AC
  Administered 2016-03-24: 125 mg via INTRAVENOUS
  Filled 2016-03-24: qty 2

## 2016-03-24 MED ORDER — LEVOFLOXACIN 750 MG PO TABS
750.0000 mg | ORAL_TABLET | Freq: Every day | ORAL | Status: DC
  Administered 2016-03-25 – 2016-03-26 (×2): 750 mg via ORAL
  Filled 2016-03-24 (×2): qty 1

## 2016-03-24 MED ORDER — ONDANSETRON HCL 4 MG PO TABS: 4.0000 mg | ORAL_TABLET | Freq: Four times a day (QID) | ORAL | Status: DC | PRN

## 2016-03-24 MED ORDER — LEVOFLOXACIN 750 MG PO TABS
750.0000 mg | ORAL_TABLET | Freq: Once | ORAL | Status: AC
  Administered 2016-03-24: 750 mg via ORAL
  Filled 2016-03-24: qty 1

## 2016-03-24 MED ORDER — ALBUTEROL SULFATE (2.5 MG/3ML) 0.083% IN NEBU
5.0000 mg | INHALATION_SOLUTION | Freq: Once | RESPIRATORY_TRACT | Status: AC
  Administered 2016-03-24: 5 mg via RESPIRATORY_TRACT
  Filled 2016-03-24: qty 6

## 2016-03-24 MED ORDER — FLUTICASONE PROPIONATE 50 MCG/ACT NA SUSP
1.0000 | Freq: Every day | NASAL | Status: DC
  Administered 2016-03-25 – 2016-03-26 (×2): 1 via NASAL
  Filled 2016-03-24: qty 16

## 2016-03-24 MED ORDER — METHYLPREDNISOLONE SODIUM SUCC 125 MG IJ SOLR
60.0000 mg | INTRAMUSCULAR | Status: DC
  Administered 2016-03-25 – 2016-03-26 (×2): 60 mg via INTRAVENOUS
  Filled 2016-03-24 (×2): qty 2

## 2016-03-24 MED ORDER — ENOXAPARIN SODIUM 40 MG/0.4ML ~~LOC~~ SOLN
40.0000 mg | SUBCUTANEOUS | Status: DC
  Administered 2016-03-24 – 2016-03-25 (×2): 40 mg via SUBCUTANEOUS
  Filled 2016-03-24: qty 0.4

## 2016-03-24 MED ORDER — NITROGLYCERIN 0.4 MG SL SUBL: 0.4000 mg | SUBLINGUAL_TABLET | SUBLINGUAL | Status: DC | PRN

## 2016-03-24 MED ORDER — SODIUM CHLORIDE 0.9% FLUSH
3.0000 mL | Freq: Two times a day (BID) | INTRAVENOUS | Status: DC
  Administered 2016-03-24 – 2016-03-26 (×5): 3 mL via INTRAVENOUS

## 2016-03-24 MED ORDER — ROSUVASTATIN CALCIUM 5 MG PO TABS
40.0000 mg | ORAL_TABLET | Freq: Every day | ORAL | Status: DC
  Administered 2016-03-25: 10:00:00 40 mg via ORAL
  Filled 2016-03-24: qty 8

## 2016-03-24 MED ORDER — IPRATROPIUM-ALBUTEROL 0.5-2.5 (3) MG/3ML IN SOLN
3.0000 mL | Freq: Once | RESPIRATORY_TRACT | Status: AC
  Administered 2016-03-24: 3 mL via RESPIRATORY_TRACT
  Filled 2016-03-24: qty 3

## 2016-03-24 MED ORDER — IRBESARTAN 150 MG PO TABS
75.0000 mg | ORAL_TABLET | Freq: Every day | ORAL | Status: DC
  Administered 2016-03-26: 08:00:00 75 mg via ORAL
  Filled 2016-03-24 (×2): qty 1

## 2016-03-24 MED ORDER — ASPIRIN EC 81 MG PO TBEC
81.0000 mg | DELAYED_RELEASE_TABLET | Freq: Every day | ORAL | Status: DC
  Administered 2016-03-25 – 2016-03-26 (×2): 81 mg via ORAL
  Filled 2016-03-24 (×2): qty 1

## 2016-03-24 MED ORDER — ACETAMINOPHEN 325 MG PO TABS: 650.0000 mg | ORAL_TABLET | Freq: Four times a day (QID) | ORAL | Status: DC | PRN

## 2016-03-24 MED ORDER — DOCUSATE SODIUM 100 MG PO CAPS
100.0000 mg | ORAL_CAPSULE | Freq: Two times a day (BID) | ORAL | Status: DC
  Administered 2016-03-24 – 2016-03-26 (×4): 100 mg via ORAL
  Filled 2016-03-24 (×4): qty 1

## 2016-03-24 MED ORDER — HYDROCOD POLST-CPM POLST ER 10-8 MG/5ML PO SUER
5.0000 mL | Freq: Two times a day (BID) | ORAL | Status: DC
  Administered 2016-03-24 – 2016-03-26 (×4): 5 mL via ORAL
  Filled 2016-03-24 (×4): qty 5

## 2016-03-24 MED ORDER — PIRFENIDONE 267 MG PO CAPS
3.0000 | ORAL_CAPSULE | Freq: Three times a day (TID) | ORAL | Status: DC
  Administered 2016-03-24 – 2016-03-26 (×5): 3 via ORAL
  Filled 2016-03-24 (×5): qty 3

## 2016-03-24 MED ORDER — LEVOFLOXACIN 750 MG PO TABS: 750.0000 mg | ORAL_TABLET | Freq: Every day | ORAL | Status: DC

## 2016-03-24 MED ORDER — IPRATROPIUM BROMIDE 0.02 % IN SOLN: 0.5000 mg | RESPIRATORY_TRACT | Status: DC

## 2016-03-24 MED ORDER — DIPHENHYDRAMINE HCL 25 MG PO CAPS
25.0000 mg | ORAL_CAPSULE | Freq: Every evening | ORAL | Status: DC | PRN
  Administered 2016-03-24: 23:00:00 25 mg via ORAL
  Filled 2016-03-24 (×2): qty 1

## 2016-03-24 MED ORDER — ACETAMINOPHEN 650 MG RE SUPP: 650.0000 mg | Freq: Four times a day (QID) | RECTAL | Status: DC | PRN

## 2016-03-24 NOTE — ED Notes (Signed)
C/o increasing SOB x 1 week.  Home o2 sats in the 70"s.

## 2016-03-24 NOTE — ED Notes (Signed)
Placed on 2L [place]  

## 2016-03-24 NOTE — ED Notes (Signed)
Patient transported to radiology

## 2016-03-24 NOTE — ED Provider Notes (Signed)
[facility] Emergency Department Provider Note  ____________________________________________  Time seen: 8:35 AM  I have reviewed the triage vital signs and the nursing notes.   HISTORY  Chief Complaint Shortness of Breath    HPI Bradley May. is a 64 y.o. male who complains of gradual onset shortness of breath and dyspnea on exertion over the past week. Has a history of idiopathic pulmonary fibrosis not requiring home oxygen at this time. He was in his usual state of health, but over the past week he's develop or shortness of breath and nonproductive coughing. No fever chills or sweats. He has [place] traveled [place] the beach which seem to be correlated with the onset of symptoms. He tends to be sensitive to changes in environment. Follows with Dr. Arlis Porta of pulmonology. Denies chest pain.  Per family room air oxygen saturation was about 77%.   Past Medical History  Diagnosis Date  . CAD (coronary artery disease)     Dr. [name]  . BPH (benign prostatic hypertrophy)     Dr. [name]  . Interstitial lung disease (HCC)     Dr. [name]  . Allergic rhinitis   . Idiopathic pulmonary fibrosis [facility])      Patient Active Problem List   Diagnosis Date Noted  . Low back pain 10/18/2015  . PSVT (paroxysmal supraventricular tachycardia) (HCC) 04/26/2015  . Abdominal aortic aneurysm (HCC) 03/03/2015  . Essential hypertension 03/03/2015  . Old MI (myocardial infarction) 05/18/2012  . Routine general medical examination at a health care facility 01/31/2012  . Cough 12/03/2011  . BPH (benign prostatic hypertrophy)   . ACTINIC KERATOSIS 11/21/2009  . Nonallergic rhinitis 05/10/2008  . Idiopathic pulmonary fibrosis (HCC) 01/13/2008  . HYPERLIPIDEMIA 05/07/2007  . Coronary atherosclerosis of native coronary artery 05/07/2007     Past Surgical History  Procedure Laterality Date  . Knee surgery  1978    Right  . Angioplasty  1992  . Coronary angioplasty with  stent placement  01/2001    RCA  . Esophagogastroduodenoscopy  01/2001    bleeding  . Ureteral stent placement  01/2009    Dr. [name]     Current Outpatient Rx  Name  Route  Sig  Dispense  Refill  . aspirin 81 MG tablet   Oral   Take 81 mg by mouth daily.           . CRESTOR 40 MG tablet      TAKE 1 TABLET EVERY DAY   30 tablet   11   . fluticasone (FLONASE) 50 MCG/ACT nasal spray      USE TWO PUFFS INTO EACH NOSTRIL DAILY   16 g   2   . metoprolol succinate (TOPROL-XL) 25 MG 24 hr tablet      TAKE 1 TABLET EVERY DAY   90 tablet   3   . nitroGLYCERIN (NITROSTAT) 0.4 MG SL tablet   Sublingual   Place 1 tablet (0.4 mg total) under the tongue every 5 (five) minutes as needed. For chest pain. May repeat 3 times   25 tablet   1   . Pirfenidone (ESBRIET) 267 MG CAPS   Oral   Take 3 capsules by mouth 3 (three) times daily.   270 capsule   5   . silodosin (RAPAFLO) 8 MG CAPS capsule   Oral   Take 8 mg by mouth daily as needed.         . traMADol (ULTRAM) 50 MG tablet  TAKE ONE TABLET EVERY SIX HOURS AS NEEDED FOR COUGH   60 tablet   5   . valsartan (DIOVAN) 80 MG tablet      TAKE 1 TABLET EVERY DAY   30 tablet   11      Allergies Atorvastatin and Doxycycline   Family History  Problem Relation Age of Onset  . Coronary artery disease Father   . Diabetes Father   . Hypertension Father   . Cancer Father     prostate  . Hypertension Mother     Social History Social History  Substance Use Topics  . Smoking status: Former Smoker -- 1.00 packs/day for 25 years    Types: Cigarettes    Quit date: 09/23/1990  . Smokeless tobacco: Never Used  . Alcohol Use: Yes     Comment: Social    Review of Systems  Constitutional:   No fever or chills.  ENT:   No sore throat. No rhinorrhea. Cardiovascular:   No chest pain. Respiratory:   Positive shortness of breath with nonproductive cough. Gastrointestinal:   Negative for abdominal pain, vomiting and  diarrhea.  Genitourinary:   Negative for dysuria or difficulty urinating. Musculoskeletal:   Negative for focal pain or swelling Neurological:   Negative for headaches 10-point ROS otherwise negative.  ____________________________________________   PHYSICAL EXAM:  VITAL SIGNS: ED Triage Vitals  Enc Vitals Group     BP 03/24/16 0833 136/72 mmHg     Pulse Rate 03/24/16 0833 92     Resp 03/24/16 0833 22     Temp --      Temp src --      SpO2 03/24/16 0833 81 %     Weight --      Height --      Head Cir --      Peak Flow --      Pain Score 03/24/16 0831 2     Pain Loc --      Pain Edu? --      Excl. in GC? --     Vital signs reviewed, nursing assessments reviewed.   Constitutional:   Alert and oriented. Mild respiratory distress. Eyes:   No scleral icterus. No conjunctival pallor. PERRL. EOMI.  No nystagmus. ENT   Head:   Normocephalic and atraumatic.   Nose:   No congestion/rhinnorhea. No septal hematoma   Mouth/Throat:   MMM, no pharyngeal erythema. No peritonsillar mass.    Neck:   No stridor. No SubQ emphysema. No meningismus. Hematological/Lymphatic/Immunilogical:   No cervical lymphadenopathy. Cardiovascular:   RRR. Symmetric bilateral radial and DP pulses.  No murmurs.  Respiratory:   Tachypnea. Normal work of breathing. Diffuse inspiratory Velcro like crackles in the bilateral middle and lower lung fields. No wheezing. Gastrointestinal:   Soft and nontender. Non distended. There is no CVA tenderness.  No rebound, rigidity, or guarding. Genitourinary:   deferred Musculoskeletal:   Nontender with normal range of motion in all extremities. No joint effusions.  No lower extremity tenderness.  No edema. Neurologic:   Normal speech and language.  CN 2-10 normal. Motor grossly intact. No gross focal neurologic deficits are appreciated.  Skin:    Skin is warm, dry and intact. No rash noted.  No petechiae, purpura, or  bullae.  ____________________________________________    LABS (pertinent positives/negatives) (all labs ordered are listed, but only abnormal results are displayed) Labs Reviewed  BASIC METABOLIC PANEL - Abnormal; Notable for the following:    Glucose, Bld 110 (*)  All other components within normal limits  CBC WITH DIFFERENTIAL/PLATELET - Abnormal; Notable for the following:    RBC 4.35 (*)    Neutro Abs 7.2 (*)    Lymphs Abs 0.7 (*)    All other components within normal limits  BLOOD GAS, VENOUS - Abnormal; Notable for the following:    Bicarbonate 28.5 (*)    All other components within normal limits  TROPONIN I   ____________________________________________   EKG  Interpreted by me Sinus rhythm rate of 88 with frequent PVCs and a ventricular trigeminy pattern. Sinus beats have normal axis and intervals. Normal QRS ST segments and T waves. 5 PVCs on the strip.  ____________________________________________    RADIOLOGY  Chest x-ray consistent with pulmonary fibrosis without definite acute infiltrate  ____________________________________________   PROCEDURES CRITICAL CARE Performed by: Scotty CourtSTAFFORD, Krystle Oberman   Total critical care time: 35 minutes  Critical care time was exclusive of separately billable procedures and treating other patients.  Critical care was necessary to treat or prevent imminent or life-threatening deterioration.  Critical care was time spent personally by me on the following activities: development of treatment plan with patient and/or surrogate as well as nursing, discussions with consultants, evaluation of patient's response to treatment, examination of patient, obtaining history from patient or surrogate, ordering and performing treatments and interventions, ordering and review of laboratory studies, ordering and review of radiographic studies, pulse oximetry and re-evaluation of patient's  condition.   ____________________________________________   INITIAL IMPRESSION / ASSESSMENT AND PLAN / ED COURSE  Pertinent labs & imaging results that were available during my care of the patient were reviewed by me and considered in my medical decision making (see chart for details).  Patient presents with shortness of breath and severe acute hypoxic respiratory failure. On nasal cannula oxygen, oxygen saturation is maintained in the low 90s. Patient is feeling better with this while at rest. We'll check labs chest x-ray, anticipated admission. IV Solu-Medrol and DuoNeb 1 to try and improve oxygenation and symptom relief.   ----------------------------------------- 11:12 AM on 03/24/2016 -----------------------------------------  Workup negative. After Solu-Medrol and 3 nebulizer treatments, reassessment shows that the patient still has oxygen saturation 89% at rest on room air. Shortness of breath worsens with discontinuation of nasal cannula oxygen. I'll discuss the case with the hospitalist for admission.    ____________________________________________   FINAL CLINICAL IMPRESSION(S) / ED DIAGNOSES  Final diagnoses:  Acute respiratory failure with hypoxia (HCC)       Portions of this note were generated with dragon dictation software. Dictation errors may occur despite best attempts at proofreading.   Sharman CheekPhillip Bevely Hackbart, MD 03/24/16 1113

## 2016-03-24 NOTE — Care Management (Signed)
Patient was interested in receiving information on Advance Directive. Chaplain provided information and spent time talking with patient as to answer any questions or concerns that might exist. Patient was satisfied with service and will wait until his wife arrives to move forward with completing the necessary documentation.

## 2016-03-24 NOTE — H&P (Addendum)
[facility] -  at [facility]   PATIENT NAME: Bradley May    MR#:  [id number]  DATE OF BIRTH:  09-Jan-1951  DATE OF ADMISSION:  03/24/2016  PRIMARY CARE PHYSICIAN: [name], MD   REQUESTING/REFERRING PHYSICIAN:   CHIEF COMPLAINT:   Chief Complaint  Patient presents with  . Shortness of Breath    HISTORY OF PRESENT ILLNESS: Bradley May  is a 65 y.o. male with a known history of Pulmonary fibrosis, coronary artery disease, BPH, who presents to the hospital with complaints of shortness of breath, worsening over the past one week, coughing, clear thick phlegm. At home he noted that his oxygen saturation is were in 60s. Today, he felt presyncopal, had some chest discomfort and decided to come to emergency room for further evaluation and treatment. He also admits of wheezing, feeling weak and very short of breath, no matter what he does. In emergency room, his O2 sats were 81%. Hospitalist services were contacted for admission  PAST MEDICAL HISTORY:   Past Medical History  Diagnosis Date  . CAD (coronary artery disease)     Dr. [name]  . BPH (benign prostatic hypertrophy)     Dr. [name]  . Interstitial lung disease (HCC)     Dr. [name]  . Allergic rhinitis   . Idiopathic pulmonary fibrosis (HCC)     PAST SURGICAL HISTORY: Past Surgical History  Procedure Laterality Date  . Knee surgery  1978    Right  . Angioplasty  1992  . Coronary angioplasty with stent placement  01/2001    RCA  . Esophagogastroduodenoscopy  01/2001    bleeding  . Ureteral stent placement  01/2009    Dr. [name]    SOCIAL HISTORY:  Social History  Substance Use Topics  . Smoking status: Former Smoker -- 1.00 packs/day for 25 years    Types: Cigarettes    Quit date: 09/23/1990  . Smokeless tobacco: Never Used  . Alcohol Use: Yes     Comment: Social    FAMILY HISTORY:  Family History  Problem Relation Age of Onset  . Coronary artery disease Father   . Diabetes  Father   . Hypertension Father   . Cancer Father     prostate  . Hypertension Mother     DRUG ALLERGIES:  Allergies  Allergen Reactions  . Atorvastatin     REACTION: Headache  . Doxycycline     REACTION: Nausea    Review of Systems  Constitutional: Negative for fever, chills, weight loss and malaise/fatigue.  HENT: Negative for congestion.   Eyes: Negative for blurred vision and double vision.  Respiratory: Positive for cough, sputum production, shortness of breath and wheezing.   Cardiovascular: Positive for chest pain. Negative for palpitations, orthopnea, leg swelling and PND.  Gastrointestinal: Negative for nausea, vomiting, abdominal pain, diarrhea, constipation, blood in stool and melena.  Genitourinary: Negative for dysuria, urgency, frequency and hematuria.  Musculoskeletal: Negative for falls.  Skin: Negative for rash.  Neurological: Positive for weakness. Negative for dizziness.  Psychiatric/Behavioral: Negative for depression and memory loss. The patient is not nervous/anxious.     MEDICATIONS AT HOME:  Prior to Admission medications   Medication Sig Start Date End Date Taking? Authorizing Provider  aspirin EC 81 MG tablet Take 81 mg by mouth daily.   Yes Historical Provider, MD  Calcium Citrate-Vitamin D (CITRACAL MAXIMUM) 315-250 MG-UNIT TABS Take 1 tablet by mouth daily.   Yes Historical Provider, MD  CRESTOR 40 MG tablet TAKE  1 TABLET EVERY DAY 06/05/15  Yes Karie Schwalbeichard I Letvak, MD  fluticasone (FLONASE) 50 MCG/ACT nasal spray USE TWO PUFFS INTO EACH NOSTRIL DAILY Patient taking differently: USE TWO PUFFS INTO EACH NOSTRIL DAILY AS NEEDED RHINITIS/ALLERGIES. 01/13/15  Yes Karie Schwalbeichard I Letvak, MD  metoprolol succinate (TOPROL-XL) 25 MG 24 hr tablet TAKE 1 TABLET EVERY DAY 03/14/16  Yes Karie Schwalbeichard I Letvak, MD  nitroGLYCERIN (NITROSTAT) 0.4 MG SL tablet Place 1 tablet (0.4 mg total) under the tongue every 5 (five) minutes as needed. For chest pain. May repeat 3 times 02/28/16   Yes Lewayne BuntingBrian S Crenshaw, MD  Pirfenidone (ESBRIET) 267 MG CAPS Take 3 capsules by mouth 3 (three) times daily. 05/24/14  Yes Barbaraann ShareKeith M Clance, MD  silodosin (RAPAFLO) 8 MG CAPS capsule Take 8 mg by mouth daily as needed. 04/07/15  Yes Historical Provider, MD  traMADol (ULTRAM) 50 MG tablet TAKE ONE TABLET EVERY SIX HOURS AS NEEDED FOR COUGH 11/07/15  Yes Nyoka CowdenMichael B Wert, MD  valsartan (DIOVAN) 80 MG tablet TAKE 1 TABLET EVERY DAY 03/13/16  Yes Lewayne BuntingBrian S Crenshaw, MD      PHYSICAL EXAMINATION:   VITAL SIGNS: Blood pressure 134/68, pulse 32, resp. rate 20, SpO2 95 %.  GENERAL:  65 y.o.-year-old patient lying in the bed In moderate respiratory distress, tachypneic, struggling to breathe despite oxygen through nasal cannula .  EYES: Pupils equal, round, reactive to light and accommodation. No scleral icterus. Extraocular muscles intact.  HEENT: Head atraumatic, normocephalic. Oropharynx and nasopharynx clear.  NECK:  Supple, no jugular venous distention. No thyroid enlargement, no tenderness.  LUNGS: Normal breath sounds bilaterally, no wheezing, bilateral rales, rhonchi and dry crepitations noted, especially at lung bases. Using accessory muscles of respiration, even at rest.  CARDIOVASCULAR: S1, S2 normal, intermittently irregular. No murmurs, rubs, or gallops.  ABDOMEN: Soft, nontender, nondistended. Bowel sounds present. No organomegaly or mass.  EXTREMITIES: No pedal edema, cyanosis, or clubbing.  NEUROLOGIC: Cranial nerves II through XII are intact. Muscle strength 5/5 in all extremities. Sensation intact. Gait not checked.  PSYCHIATRIC: The patient is alert and oriented x 3.  SKIN: No obvious rash, lesion, or ulcer.   LABORATORY PANEL:   CBC  Recent Labs Lab 03/24/16 0840  WBC 9.1  HGB 13.8  HCT 40.6  PLT 185  MCV 93.3  MCH 31.7  MCHC 33.9  RDW 13.0  LYMPHSABS 0.7*  MONOABS 0.9  EOSABS 0.2  BASOSABS 0.0    ------------------------------------------------------------------------------------------------------------------  Chemistries   Recent Labs Lab 03/24/16 0840  NA 136  K 4.1  CL 103  CO2 26  GLUCOSE 110*  BUN 12  CREATININE 0.92  CALCIUM 9.1   ------------------------------------------------------------------------------------------------------------------  Cardiac Enzymes  Recent Labs Lab 03/24/16 0840  TROPONINI <0.03   ------------------------------------------------------------------------------------------------------------------  RADIOLOGY: Dg Chest 2 View  03/24/2016  CLINICAL DATA:  Pt to ED with increased shortness of breath x 1 week, worse last night with cough; known pulmonary fibrosis x 7 years, former former smoker 10+ years ago. Pt denies currently being on at home oxygen. EXAM: CHEST - 2 VIEW COMPARISON:  05/09/2015 FINDINGS: Coarse fibrotic interstitial and airspace opacities throughout both lungs, right greater than left, largely stable since prior studies dating back to 04/19/2015. No definite superimposed infiltrate or edema. Heart size upper limits normal. No effusion. Visualized bones unremarkable. IMPRESSION: 1. Advanced pulmonary fibrosis and chronic interstitial lung disease without definite superimposed or acute process. Electronically Signed   By: Corlis Leak  Hassell M.D.   On: 03/24/2016 09:01  EKG: Orders placed or performed during the hospital encounter of 03/24/16  . ED EKG  . ED EKG  . EKG 12-Lead  . EKG 12-Lead  EKG in emergency room revealed a sinus rhythm, rate of 88, ventricular trigeminy, probable left atrial enlargement, incomplete right bundle-branch block, no acute ST-T changes  IMPRESSION AND PLAN:  Principal Problem:   Acute respiratory failure with hypoxia (HCC) Active Problems:   COPD exacerbation (HCC)   Acute bronchitis   Hyperglycemia #1. Acute respiratory failure with hypoxia, admit patient, medical floor. Continue oxygen  therapy, wean off as tolerated #2. COPD exacerbation due to acute bronchitis, initiate steroids intravenously, nebulizing therapy, inhalers, follow clinically #3. Acute bronchitis, initiate patient on levofloxacin orally, get sputum cultures if possible #4. Hyperglycemia, get hemoglobin A1c to rule out diabetes #5. Chest pain with history of coronary artery disease, continue aspirin, Crestor, metoprolol, check cardiac enzymes 3. Chest pain was, however,  related to work of breathing and cough, unlikely cardiac #6. Abnormal EKG with trigeminy, get magnesium level checked and supplemented if needed, continue metoprolol, aspirin, Crestor, following cardiac enzymes  All the records are reviewed and case discussed with ED provider. Management plans discussed with the patient, family and they are in agreement.  CODE STATUS: Code Status History    This patient does not have a recorded code status. Please follow your organizational policy for patients in this situation.       TOTAL TIME TAKING CARE OF THIS PATIENT: 50 minutes.    Katharina CaperVAICKUTE,Dvon Jiles M.D on 03/24/2016 at 11:56 AM  Between 7am to 6pm - Pager - [phone] After 6pm go to [email or web address] - password [email or web address] [facility]  [facility]  Office  [phone]  CC: Primary care physician; Tillman Abideichard Letvak, MD

## 2016-03-25 LAB — BASIC METABOLIC PANEL
ANION GAP: 7 (ref 5–15)
BUN: 19 mg/dL (ref 6–20)
CALCIUM: 8.7 mg/dL — AB (ref 8.9–10.3)
CO2: 25 mmol/L (ref 22–32)
CREATININE: 1.05 mg/dL (ref 0.61–1.24)
Chloride: 104 mmol/L (ref 101–111)
GLUCOSE: 120 mg/dL — AB (ref 65–99)
Potassium: 4 mmol/L (ref 3.5–5.1)
Sodium: 136 mmol/L (ref 135–145)

## 2016-03-25 LAB — CBC
HCT: 36.3 % — ABNORMAL LOW (ref 40.0–52.0)
HEMOGLOBIN: 12.3 g/dL — AB (ref 13.0–18.0)
MCH: 31.5 pg (ref 26.0–34.0)
MCHC: 33.9 g/dL (ref 32.0–36.0)
MCV: 93.1 fL (ref 80.0–100.0)
PLATELETS: 180 10*3/uL (ref 150–440)
RBC: 3.9 MIL/uL — ABNORMAL LOW (ref 4.40–5.90)
RDW: 13.1 % (ref 11.5–14.5)
WBC: 12.2 10*3/uL — ABNORMAL HIGH (ref 3.8–10.6)

## 2016-03-25 LAB — TROPONIN I

## 2016-03-25 NOTE — Progress Notes (Signed)
[facility] - [place] at [facility]   PATIENT NAME: Bradley May    MR#:  [id number]  DATE OF BIRTH:  [birth year]  SUBJECTIVE:  CHIEF COMPLAINT:   Chief Complaint  Patient presents with  . Shortness of Breath     Have IPF, came with cough and Hypoxia, Feels little better.  REVIEW OF SYSTEMS:  CONSTITUTIONAL: No fever, fatigue or weakness.  EYES: No blurred or double vision.  EARS, NOSE, AND THROAT: No tinnitus or ear pain.  RESPIRATORY: positive for cough, shortness of breath, some wheezing , no hemoptysis.  CARDIOVASCULAR: No chest pain, orthopnea, edema.  GASTROINTESTINAL: No nausea, vomiting, diarrhea or abdominal pain.  GENITOURINARY: No dysuria, hematuria.  ENDOCRINE: No polyuria, nocturia,  HEMATOLOGY: No anemia, easy bruising or bleeding SKIN: No rash or lesion. MUSCULOSKELETAL: No joint pain or arthritis.   NEUROLOGIC: No tingling, numbness, weakness.  PSYCHIATRY: No anxiety or depression.   ROS  DRUG ALLERGIES:   Allergies  Allergen Reactions  . Atorvastatin     REACTION: Headache  . Doxycycline     REACTION: Nausea    VITALS:  Blood pressure 127/61, pulse 92, temperature 98.3 F (36.8 C), temperature source Oral, resp. rate 24, height 6' (1.829 m), weight 85.004 kg (187 lb 6.4 oz), SpO2 91 %.  PHYSICAL EXAMINATION:  GENERAL:  65 y.o.-year-old patient lying in the bed with no acute distress.  EYES: Pupils equal, round, reactive to light and accommodation. No scleral icterus. Extraocular muscles intact.  HEENT: Head atraumatic, normocephalic. Oropharynx and nasopharynx clear.  NECK:  Supple, no jugular venous distention. No thyroid enlargement, no tenderness.  LUNGS: Normal breath sounds bilaterally, positive wheezing, no rales,rhonchi or crepitation. No use of accessory muscles of respiration. Need oxygen via nasal canula. CARDIOVASCULAR: S1, S2 normal. No murmurs, rubs, or gallops.  ABDOMEN: Soft, nontender, nondistended. Bowel sounds  present. No organomegaly or mass.  EXTREMITIES: No pedal edema, cyanosis, or clubbing.  NEUROLOGIC: Cranial nerves II through XII are intact. Muscle strength 5/5 in all extremities. Sensation intact. Gait not checked.  PSYCHIATRIC: The patient is alert and oriented x 3.  SKIN: No obvious rash, lesion, or ulcer.   Physical Exam LABORATORY PANEL:   CBC  Recent Labs Lab 03/25/16 0237  WBC 12.2*  HGB 12.3*  HCT 36.3*  PLT 180   ------------------------------------------------------------------------------------------------------------------  Chemistries   Recent Labs Lab 03/24/16 0840 03/25/16 0237  NA 136 136  K 4.1 4.0  CL 103 104  CO2 26 25  GLUCOSE 110* 120*  BUN 12 19  CREATININE 0.92 1.05  CALCIUM 9.1 8.7*  MG 1.9  --    ------------------------------------------------------------------------------------------------------------------  Cardiac Enzymes  Recent Labs Lab 03/24/16 2033 03/25/16 0237  TROPONINI <0.03 <0.03   ------------------------------------------------------------------------------------------------------------------  RADIOLOGY:  Dg Chest 2 View  03/24/2016  CLINICAL DATA:  Pt to ED with increased shortness of breath x 1 week, worse last night with cough; known pulmonary fibrosis x 7 years, former former smoker 10+ years ago. Pt denies currently being on at home oxygen. EXAM: CHEST - 2 VIEW COMPARISON:  05/09/2015 FINDINGS: Coarse fibrotic interstitial and airspace opacities throughout both lungs, right greater than left, largely stable since prior studies dating back to 04/19/2015. No definite superimposed infiltrate or edema. Heart size upper limits normal. No effusion. Visualized bones unremarkable. IMPRESSION: 1. Advanced pulmonary fibrosis and chronic interstitial lung disease without definite superimposed or acute process. Electronically Signed   By: Corlis Leak  Hassell M.D.   On: 03/24/2016 09:01    ASSESSMENT AND  PLAN:   Principal Problem:    Acute respiratory failure with hypoxia (HCC) Active Problems:   COPD exacerbation (HCC)   Acute bronchitis   Hyperglycemia   #1. Acute respiratory failure with hypoxia,   Continue oxygen therapy, wean off as tolerated #2. COPD exacerbation due to acute bronchitis,Pt have Ideopathic pulmonary fibrosis.  steroids intravenously, nebulizing therapy, inhalers, follow clinically #3. Acute bronchitis, initiate patient on levofloxacin orally, sputum cultures sent. #4. Hyperglycemia, HbA1c is 5.7. #5. Chest pain with history of coronary artery disease, continue aspirin, Crestor, metoprolol, negative cardiac enzymes 3. Chest pain was, however, related to work of breathing and cough, unlikely cardiac #6. Abnormal EKG with trigeminy,   Mg is normal, currently stable.   All the records are reviewed and case discussed with Care Management/Social Workerr. Management plans discussed with the patient, family and they are in agreement.  CODE STATUS: Full  TOTAL TIME TAKING CARE OF THIS PATIENT: 35 minutes.     POSSIBLE D/C IN 1-2 DAYS, DEPENDING ON CLINICAL CONDITION.   Altamese DillingVACHHANI, Kelcey Wickstrom M.D on 03/25/2016   Between 7am to 6pm - Pager - [phone]  After 6pm go to [email or web address] - password [email or web address]  [facility] [place] [facility]  Office  [phone]  CC: Primary care physician; Tillman Abideichard Letvak, MD  Note: This dictation was prepared with Dragon dictation along with smaller phrase technology. Any transcriptional errors that result from this process are unintentional.

## 2016-03-25 NOTE — Progress Notes (Signed)
1830 Good day. Starting down the path to get home O2 approved. Upbeat and talkative today. Still SOB with any exertion.

## 2016-03-25 NOTE — Care Management Note (Addendum)
Case Management Note  Patient Details  Name: Bradley PeonFloyd G Dario Jr. MRN: [id number] Date of Birth: [birth year]  Subjective/Objective:    65yo Bradley May was admitted 03/24/16 with shortness of breath per a COPD exacerbation. He is married and resides at home with his wife. PCP= Dr Alphonsus SiasLetvak.  Pharmacy=[facility] in [place].  Bradley May has no home assistive equipment, no home oxygen, and no home health services. He drives and his wife can also assist with transportation if needed. This Clinical research associatewriter requested oxygen saturation measurements to Bradley Nest's nurse to help determine whether he will need home oxygen. Bradley May admits that he gets short of breath easily even on 2 liter nasal cannula. Case management will follow for discharge planning. Anticipate home with new oxygen. provided Bradley May with a list of local home health providers, no choice made yet.   Oxygen Saturation measurements are charted.             Action/Plan:   Expected Discharge Date:                  Expected Discharge Plan:     In-House Referral:     Discharge planning Services     Post Acute Care Choice:    Choice offered to:     DME Arranged:    DME Agency:     HH Arranged:    HH Agency:     Status of Service:     If discussed at [employer] Length of Stay Meetings, dates discussed:    Additional Comments:  Catcher Dehoyos A, RN 03/25/2016, 4:15 PM

## 2016-03-25 NOTE — Progress Notes (Signed)
   03/25/16 1100  Clinical Encounter Type  Visited With Patient and family together  Visit Type Follow-up  Referral From Chaplain  Consult/Referral To Nurse  Spiritual Encounters  Spiritual Needs Other (Comment) (Checked to see if pt. was prepared to complete AD.)  Stress Factors  Patient Stress Factors Health changes  Advance Directives (For Healthcare)  Does patient have an advance directive? No  Would patient like information on creating an advanced directive? Yes - Educational materials given  Followed up to see if patient was prepared to move forward with AD. He advised that he had not completed it yet. I asked him to have the nursing staff page the on-call Chaplain when the patient was ready to proceed. Chap. Mazzie Brodrick G. Terrall Bley, ext. [phone]

## 2016-03-25 NOTE — Care Management Important Message (Signed)
Important Message  Patient Details  Name: Bradley PeonFloyd G Winnie Jr. MRN: [id number] Date of Birth: Oct 09, 1950   Medicare Important Message Given:  Yes    Wahid Holley A, RN 03/25/2016, 7:16 AM

## 2016-03-25 NOTE — Progress Notes (Signed)
SATURATION QUALIFICATIONS: (This note is used to comply with regulatory documentation for home oxygen)  Patient Saturations on Room Air at Rest = 88%  Patient Saturations on Room Air while Ambulating = 86%  Patient Saturations on 2 Liters of oxygen while Ambulating = 88%  Please briefly explain why patient needs home oxygen: COPD Oxygen measurements obtained and provided by Majel HomerMyra Flowers, RN on 03/25/16 at 4:30pm and given to Bryan LemmaMarilyn Jazalyn Mondor, RN, BSN.

## 2016-03-26 MED ORDER — PREDNISONE 10 MG (21) PO TBPK: ORAL_TABLET | ORAL | Status: DC

## 2016-03-26 MED ORDER — GUAIFENESIN ER 600 MG PO TB12: 600.0000 mg | ORAL_TABLET | Freq: Two times a day (BID) | ORAL | Status: AC

## 2016-03-26 MED ORDER — LEVOFLOXACIN 750 MG PO TABS: 750.0000 mg | ORAL_TABLET | Freq: Every day | ORAL | Status: DC

## 2016-03-26 MED ORDER — BUDESONIDE-FORMOTEROL FUMARATE 80-4.5 MCG/ACT IN AERO: 2.0000 | INHALATION_SPRAY | Freq: Two times a day (BID) | RESPIRATORY_TRACT | Status: DC

## 2016-03-26 MED ORDER — HYDROCOD POLST-CPM POLST ER 10-8 MG/5ML PO SUER: 5.0000 mL | Freq: Two times a day (BID) | ORAL | Status: DC | PRN

## 2016-03-26 NOTE — Care Management (Signed)
Discharge to home today per Dr. Elisabeth PigeonVachhani. Spoke with Mr. Idolina PrimerUnderwood at the bedside, discussed durable medical equipment agencies. [facility]ose [facility]. [facility] Will bring oxygen to the hospital. Family will transport. Gwenette GreetBrenda S Yanessa Hocevar RN MSN [facility] Care Management [phone]

## 2016-03-26 NOTE — Progress Notes (Signed)
Pt discharged, IV and telemetry removed, reviewed instructions and home meds, rx given to pt, o2 tank supplied and set up for pt. No questions verbalized, escorted by self on 2L [place], family present at this time

## 2016-03-26 NOTE — Progress Notes (Signed)
SATURATION QUALIFICATIONS: (This note is used to comply with regulatory documentation for home oxygen)  Patient Saturations on Room Air at Rest = 92%  Patient Saturations on Room Air while Ambulating = 84%  Patient Saturations on 2 liters of oxygen while Ambulating = 97%  Please briefly explain why patient needs home oxygen:

## 2016-03-27 LAB — CULTURE, RESPIRATORY: CULTURE: NORMAL

## 2016-03-29 NOTE — Progress Notes (Signed)
HPI: FU coronary artery disease. Patient has had previous PCI of the circumflex. Last catheterization in 2002 in setting of inferior MI showed an intermediate branch of 50%. The LAD had a 50% lesion. The circumflex had a 75% lesion followed by an 80%. The right coronary artery was occluded. Ejection fraction 55%. Patient had PCI of his right coronary at that time. Nuclear study June 2015 showed an ejection fraction of 46%. Small inferior lateral infarct with no ischemia. Patient had episode of SVT terminated with carotid massage in August 2016 by report. Seen by Dr. 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 Recently in the hospital with increased dyspnea felt secondary to pulmonary fibrosis. Chest x-ray July 2017 showed advanced pulmonary fibrosis and chronic interstitial lung disease. Since he was last seen,  He has dyspnea on exertion but no orthopnea, PND, pedal edema, chest pain or syncope.He is now on home oxygen.  Current Outpatient Prescriptions  Medication Sig Dispense Refill  . aspirin EC 81 MG tablet Take 81 mg by mouth daily.    . budesonide-formoterol (SYMBICORT) 80-4.5 MCG/ACT inhaler Inhale 2 puffs into the lungs 2 (two) times daily. 1 Inhaler 2  . Calcium Citrate-Vitamin D (CITRACAL MAXIMUM) 315-250 MG-UNIT TABS Take 1 tablet by mouth daily.    . chlorpheniramine-HYDROcodone (TUSSIONEX) 10-8 MG/5ML SUER Take 5 mLs by mouth every 12 (twelve) hours as needed for cough. 140 mL 0  . CRESTOR 40 MG tablet TAKE 1 TABLET EVERY DAY 30 tablet 11  . fluticasone (FLONASE) 50 MCG/ACT nasal spray USE TWO PUFFS INTO EACH NOSTRIL DAILY (Patient taking differently: USE TWO PUFFS INTO EACH NOSTRIL DAILY AS NEEDED RHINITIS/ALLERGIES.) 16 g 2  . guaiFENesin (MUCINEX) 600 MG 12 hr tablet Take 1 tablet (600 mg total) by mouth 2 (two) times daily. 10 tablet 0  . metoprolol succinate (TOPROL-XL) 25 MG 24 hr tablet TAKE 1 TABLET EVERY DAY 90 tablet 3  . nitroGLYCERIN (NITROSTAT) 0.4 MG SL tablet Place 1 tablet  (0.4 mg total) under the tongue every 5 (five) minutes as needed. For chest pain. May repeat 3 times 25 tablet 1  . Pirfenidone (ESBRIET) 267 MG CAPS Take 3 capsules by mouth 3 (three) times daily. 270 capsule 5  . silodosin (RAPAFLO) 8 MG CAPS capsule Take 8 mg by mouth daily as needed.    . traMADol (ULTRAM) 50 MG tablet TAKE ONE TABLET EVERY SIX HOURS AS NEEDED FOR COUGH 60 tablet 5  . valsartan (DIOVAN) 80 MG tablet TAKE 1 TABLET EVERY DAY 30 tablet 11   No current facility-administered medications for this visit.     Past Medical History  Diagnosis Date  . CAD (coronary artery disease)     Dr. Daleen SquibbWall  . BPH (benign prostatic hypertrophy)     Dr. Achilles Dunkope  . Interstitial lung disease (HCC)     Dr. Shelle Ironlance  . Allergic rhinitis   . Idiopathic pulmonary fibrosis [facility](HCC)     Past Surgical History  Procedure Laterality Date  . Knee surgery  1978    Right  . Angioplasty  1992  . Coronary angioplasty with stent placement  01/2001    RCA  . Esophagogastroduodenoscopy  01/2001    bleeding  . Ureteral stent placement  01/2009    Dr. Achilles Dunkope    Social History   Social History  . Marital Status: Married    Spouse Name: N/A  . Number of Children: 1  . Years of Education: N/A   Occupational History  .  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 Smoking status: Former Smoker -- 1.00 packs/day for 25 years    Types: Cigarettes    Quit date: 09/23/1990  . Smokeless tobacco: Never Used  . Alcohol Use: Yes     Comment: Social  . Drug Use: No  . Sexual Activity: Not on file   Other Topics Concern  . Not on file   Social History Narrative   No living will    No health care POA--but would request wife   Would accept resuscitation   No tube feeds if cognitively unaware    Family History  Problem Relation Age of Onset  . Coronary artery disease Father   . Diabetes Father   . Hypertension Father   . Cancer Father     prostate  . Hypertension Mother     ROS: no fevers  or chills, productive cough, hemoptysis, dysphasia, odynophagia, melena, hematochezia, dysuria, hematuria, rash, seizure activity, orthopnea, PND, pedal edema, claudication. Remaining systems are negative.  Physical Exam: Well-developed well-nourished in no acute distress.  Skin is warm and dry.  HEENT is normal.  Neck is supple.  Chest is clear to auscultation with normal expansion.  Cardiovascular exam is regular rate and rhythm.  Abdominal exam nontender or distended. No masses palpated. Extremities show no edema. neuro grossly intact  Assessment and plan  1 coronary artery disease-continue aspirin and statin. 2 hyperlipidemia-continue statin. 3 hypertension-blood pressure controlled. Continue present medications. 4 history of SVT-continue beta blocker. 5 abdominal aortic aneurysm-followed in [place]. 6 prominent fibrosis-management per pulmonary.  Note the patient lives in [place] and it would be more convenient for him to be seen there. I will have him follow-up with Dr. Mariah MillingGollan who takes care of his wife.  Olga MillersBrian Lynsay Fesperman, MD

## 2016-04-01 ENCOUNTER — Telehealth: Payer: Self-pay

## 2016-04-01 NOTE — Telephone Encounter (Signed)
Spoke to pt about recent hospital admission. He said he is doing much better and has an appointment with Pulmonary tomorrow.

## 2016-04-01 NOTE — Discharge Summary (Signed)
[facility] [place] at [facility]   PATIENT NAME: Bradley May    MR#:  [id number]  DATE OF BIRTH:  10-11-1950  DATE OF ADMISSION:  03/24/2016 ADMITTING PHYSICIAN: Katharina Caper, MD  DATE OF DISCHARGE: 03/26/2016  1:10 PM  PRIMARY CARE PHYSICIAN: [name], MD    ADMISSION DIAGNOSIS:  Acute respiratory failure with hypoxia (HCC) [J96.01]  DISCHARGE DIAGNOSIS:  Principal Problem:   Acute respiratory failure with hypoxia (HCC) Active Problems:   COPD exacerbation (HCC)   Acute bronchitis   Hyperglycemia   SECONDARY DIAGNOSIS:   Past Medical History  Diagnosis Date  . CAD (coronary artery disease)     Dr. [name]  . BPH (benign prostatic hypertrophy)     Dr. [name]  . Interstitial lung disease (HCC)     Dr. [name]  . Allergic rhinitis   . Idiopathic pulmonary fibrosis (HCC)     HOSPITAL COURSE:   #1. Acute respiratory failure with hypoxia,  Continue oxygen therapy, wean off as tolerated #2. COPD exacerbation due to acute bronchitis,Pt have Ideopathic pulmonary fibrosis. steroids intravenously, nebulizing therapy, inhalers, follow clinically   Pt improved next day. #3. Acute bronchitis, initiate patient on levofloxacin orally, sputum cultures sent. #4. Hyperglycemia, HbA1c is 5.7. #5. Chest pain with history of coronary artery disease, continue aspirin, Crestor, metoprolol, negative cardiac enzymes 3. Chest pain was, however, related to work of breathing and cough, unlikely cardiac #6. Abnormal EKG with trigeminy,  Mg is normal, currently stable.  DISCHARGE CONDITIONS:   Stable.  CONSULTS OBTAINED:     DRUG ALLERGIES:   Allergies  Allergen Reactions  . Atorvastatin     REACTION: Headache  . Doxycycline     REACTION: Nausea    DISCHARGE MEDICATIONS:   Discharge Medication List as of 03/26/2016 11:53 AM    START taking these medications   Details  budesonide-formoterol (SYMBICORT) 80-4.5 MCG/ACT inhaler Inhale 2 puffs  into the lungs 2 (two) times daily., Starting 03/26/2016, Until Discontinued, Print    chlorpheniramine-HYDROcodone (TUSSIONEX) 10-8 MG/5ML SUER Take 5 mLs by mouth every 12 (twelve) hours as needed for cough., Starting 03/26/2016, Until Discontinued, Print    guaiFENesin (MUCINEX) 600 MG 12 hr tablet Take 1 tablet (600 mg total) by mouth 2 (two) times daily., Starting 03/26/2016, Until Discontinued, Print    levofloxacin (LEVAQUIN) 750 MG tablet Take 1 tablet (750 mg total) by mouth daily., Starting 03/26/2016, Until Discontinued, Print    predniSONE (STERAPRED UNI-PAK 21 TAB) 10 MG (21) TBPK tablet Take 6 tabs first day, 5 tab on day 2, then 4 on day 3rd, 3 tabs on day 4th , 2 tab on day 5th, and 1 tab on 6th day., Print      CONTINUE these medications which have NOT CHANGED   Details  aspirin EC 81 MG tablet Take 81 mg by mouth daily., Until Discontinued, Historical Med    Calcium Citrate-Vitamin D (CITRACAL MAXIMUM) 315-250 MG-UNIT TABS Take 1 tablet by mouth daily., Until Discontinued, Historical Med    CRESTOR 40 MG tablet TAKE 1 TABLET EVERY DAY, Normal    fluticasone (FLONASE) 50 MCG/ACT nasal spray USE TWO PUFFS INTO EACH NOSTRIL DAILY, Normal    metoprolol succinate (TOPROL-XL) 25 MG 24 hr tablet TAKE 1 TABLET EVERY DAY, Normal    nitroGLYCERIN (NITROSTAT) 0.4 MG SL tablet Place 1 tablet (0.4 mg total) under the tongue every 5 (five) minutes as needed. For chest pain. May repeat 3 times, Starting 02/28/2016, Until Discontinued, Normal  Pirfenidone (ESBRIET) 267 MG CAPS Take 3 capsules by mouth 3 (three) times daily., Starting 05/24/2014, Until Discontinued, Phone In    silodosin (RAPAFLO) 8 MG CAPS capsule Take 8 mg by mouth daily as needed., Starting 04/07/2015, Until Discontinued, Historical Med    traMADol (ULTRAM) 50 MG tablet TAKE ONE TABLET EVERY SIX HOURS AS NEEDED FOR COUGH, Print    valsartan (DIOVAN) 80 MG tablet TAKE 1 TABLET EVERY DAY, Normal         DISCHARGE  INSTRUCTIONS:    Follow with PMD in 1 week.  If you experience worsening of your admission symptoms, develop shortness of breath, life threatening emergency, suicidal or homicidal thoughts you must seek medical attention immediately by calling 911 or calling your MD immediately  if symptoms less severe.  You Must read complete instructions/literature along with all the possible adverse reactions/side effects for all the Medicines you take and that have been prescribed to you. Take any new Medicines after you have completely understood and accept all the possible adverse reactions/side effects.   Please note  You were cared for by a hospitalist during your hospital stay. If you have any questions about your discharge medications or the care you received while you were in the hospital after you are discharged, you can call the unit and asked to speak with the hospitalist on call if the hospitalist that took care of you is not available. Once you are discharged, your primary care physician will handle any further medical issues. Please note that NO REFILLS for any discharge medications will be authorized once you are discharged, as it is imperative that you return to your primary care physician (or establish a relationship with a primary care physician if you do not have one) for your aftercare needs so that they can reassess your need for medications and monitor your lab values.    Today   CHIEF COMPLAINT:   Chief Complaint  Patient presents with  . Shortness of Breath    HISTORY OF PRESENT ILLNESS:  Bradley May  is a 65 y.o. male with a known history of Pulmonary fibrosis, coronary artery disease, BPH, who presents to the hospital with complaints of shortness of breath, worsening over the past one week, coughing, clear thick phlegm. At home he noted that his oxygen saturation is were in 60s. Today, he felt presyncopal, had some chest discomfort and decided to come to emergency room for  further evaluation and treatment. He also admits of wheezing, feeling weak and very short of breath, no matter what he does. In emergency room, his O2 sats were 81%. Hospitalist services were contacted for admission   VITAL SIGNS:  Blood pressure 122/78, pulse 85, temperature 98.2 F (36.8 C), temperature source Oral, resp. rate 24, height 6' (1.829 m), weight 85.004 kg (187 lb 6.4 oz), SpO2 93 %.  I/O:  No intake or output data in the 24 hours ending 04/01/16 0831  PHYSICAL EXAMINATION:   GENERAL: 65 y.o.-year-old patient lying in the bed with no acute distress.  EYES: Pupils equal, round, reactive to light and accommodation. No scleral icterus. Extraocular muscles intact.  HEENT: Head atraumatic, normocephalic. Oropharynx and nasopharynx clear.  NECK: Supple, no jugular venous distention. No thyroid enlargement, no tenderness.  LUNGS: Normal breath sounds bilaterally, positive wheezing, no rales,rhonchi or crepitation. No use of accessory muscles of respiration. Need oxygen via nasal canula. CARDIOVASCULAR: S1, S2 normal. No murmurs, rubs, or gallops.  ABDOMEN: Soft, nontender, nondistended. Bowel sounds present. No organomegaly or  mass.  EXTREMITIES: No pedal edema, cyanosis, or clubbing.  NEUROLOGIC: Cranial nerves II through XII are intact. Muscle strength 5/5 in all extremities. Sensation intact. Gait not checked.  PSYCHIATRIC: The patient is alert and oriented x 3.  SKIN: No obvious rash, lesion, or ulcer.    DATA REVIEW:   CBC No results for input(s): WBC, HGB, HCT, PLT in the last 168 hours.  Chemistries  No results for input(s): NA, K, CL, CO2, GLUCOSE, BUN, CREATININE, CALCIUM, MG, AST, ALT, ALKPHOS, BILITOT in the last 168 hours.  Invalid input(s): GFRCGP  Cardiac Enzymes No results for input(s): TROPONINI in the last 168 hours.  Microbiology Results  Results for orders placed or performed during the hospital encounter of 03/24/16  Culture,  sputum-assessment     Status: None   Collection Time: 03/24/16  3:29 PM  Result Value Ref Range Status   Specimen Description SPU  Final   Special Requests NONE  Final   Sputum evaluation THIS SPECIMEN IS ACCEPTABLE FOR SPUTUM CULTURE  Final   Report Status 03/24/2016 FINAL  Final  Culture, respiratory (NON-Expectorated)     Status: None   Collection Time: 03/24/16  3:29 PM  Result Value Ref Range Status   Specimen Description SPU  Final   Special Requests NONE Reflexed from [id number]  Final   Gram Stain   Final    FEW WBC PRESENT,BOTH PMN AND MONONUCLEAR RARE SQUAMOUS EPITHELIAL CELLS PRESENT FEW GRAM POSITIVE COCCI IN PAIRS FEW GRAM POSITIVE RODS    Culture   Final    Consistent with normal respiratory flora. Performed at [facility]    Report Status 03/27/2016 FINAL  Final    RADIOLOGY:  No results found.  EKG:   Orders placed or performed during the hospital encounter of 03/24/16  . ED EKG  . ED EKG  . EKG 12-Lead  . EKG 12-Lead      Management plans discussed with the patient, family and they are in agreement.  CODE STATUS:  Code Status History    Date Active Date Inactive Code Status Order ID Comments User Context   03/24/2016  1:06 PM 03/26/2016  4:11 PM Full Code [id number]  Katharina Caperima Vaickute, MD Inpatient      TOTAL TIME TAKING CARE OF THIS PATIENT: 35 minutes.    Altamese DillingVACHHANI, Muaaz Brau M.D on 04/01/2016 at 8:31 AM  Between 7am to 6pm - Pager - [phone]  After 6pm go to [email or web address] - password [email or web address]  [facility]  Office  [phone]  CC: Primary care physician; Tillman Abideichard Letvak, MD   Note: This dictation was prepared with Dragon dictation along with smaller phrase technology. Any transcriptional errors that result from this process are unintentional.

## 2016-04-02 ENCOUNTER — Encounter: Payer: Self-pay | Admitting: Cardiology

## 2016-04-02 ENCOUNTER — Encounter: Payer: Self-pay | Admitting: Internal Medicine

## 2016-04-02 ENCOUNTER — Ambulatory Visit (INDEPENDENT_AMBULATORY_CARE_PROVIDER_SITE_OTHER): Payer: PPO | Admitting: Internal Medicine

## 2016-04-02 ENCOUNTER — Ambulatory Visit (INDEPENDENT_AMBULATORY_CARE_PROVIDER_SITE_OTHER): Payer: PPO | Admitting: Cardiology

## 2016-04-02 VITALS — BP 136/86 | HR 76 | Ht 72.0 in | Wt 182.0 lb

## 2016-04-02 VITALS — BP 104/62 | HR 68 | Ht 72.0 in | Wt 183.0 lb

## 2016-04-02 DIAGNOSIS — I10 Essential (primary) hypertension: Secondary | ICD-10-CM

## 2016-04-02 DIAGNOSIS — J9611 Chronic respiratory failure with hypoxia: Secondary | ICD-10-CM

## 2016-04-02 DIAGNOSIS — R05 Cough: Secondary | ICD-10-CM

## 2016-04-02 DIAGNOSIS — J84112 Idiopathic pulmonary fibrosis: Secondary | ICD-10-CM | POA: Diagnosis not present

## 2016-04-02 DIAGNOSIS — I2510 Atherosclerotic heart disease of native coronary artery without angina pectoris: Secondary | ICD-10-CM

## 2016-04-02 DIAGNOSIS — E785 Hyperlipidemia, unspecified: Secondary | ICD-10-CM | POA: Diagnosis not present

## 2016-04-02 DIAGNOSIS — R059 Cough, unspecified: Secondary | ICD-10-CM

## 2016-04-02 MED ORDER — PANTOPRAZOLE SODIUM 40 MG PO TBEC: 40.0000 mg | DELAYED_RELEASE_TABLET | Freq: Every day | ORAL | Status: DC

## 2016-04-02 MED ORDER — PREDNISONE 10 MG PO TABS: ORAL_TABLET | ORAL | Status: DC

## 2016-04-02 NOTE — Patient Instructions (Signed)
Follow up with Dr. [place] in 1 year.  If you need a refill on your cardiac medications before your next appointment, please call your pharmacy.

## 2016-04-02 NOTE — Progress Notes (Signed)
Subjective:     Patient ID: Bradley Ferg., male   DOB: 26-Apr-1951    MRN: [id number]    Brief patient profile:  65 yowm quit smoking 1994  doe x 2010 dx with pf with minimal progression followed by [facility] previously  seen for the first time by Berneta Sconyers 05/09/2015     History of Present Illness  05/09/2015 1st office visit/ Jahmel Flannagan  Re PF on esbriet Chief Complaint  Patient presents with  . Follow-up    Pt here to discuss PFT results. No new complaints voiced   Not limited by breathing from desired activities   Main concern is chronic dry cough not related to ex or deep breath/ worse at hs and occ uses tramadol to control/ sense of pnds but not keeping him up at noct rec Pantoprazole (protonix) 40 mg   Take  30-60 min before first meal of the day and Pepcid (famotidine)  20 mg one @  bedtime  X 6 week trial and if no benefit and still need to use tramadol daily to control the cough then ok to stop it but definitely continue the diet/ lifestyle recs since there is an association between IPF and Reflux  GERD diet   For drainage take chlortrimeton (chlorpheniramine) 4 mg every 4 hours available over the counter (may cause drowsiness but this may help you sleep so take 1-2 at bedtime)   Ok to take tramadol if you must to control cough Please schedule a follow up visit in 3 months but call sooner if needed  Late add rec lfts needed     08/07/2015  f/u ov/Bradley May re: PF / on esbriet since 03/2013   Chief Complaint  Patient presents with  . Follow-up    Pt states his breathing is doing well. Cough is unchanged. No new co's today.    doe = MMRC1 = can walk nl pace, flat grade, can't hurry or go uphills or steps s sob  = no real change  Cough is worse at hs / gerd rx / hs h2 but not hs h1 / p stirs in am  No better on gerd rx so stopped p 6 weeks and no worse sob or cough off it/ continues with lifestyle changes  Cough worse bending over as is sob rec Add pepcid 20 mg at bedtime  For drainage /  throat tickle try take CHLORPHENIRAMINE  4 mg - take one every 4 hours as needed - available over the counter- may cause drowsiness so start with just a bedtime dose or two and see if the night time is any better      11/07/2015  f/u ov/Bradley May re:  PF/ esbriet since 03/2013  Chief Complaint  Patient presents with  . Pulmonary Fibrosis    Breathing is unchanged since last OV. Reports SOB, chest tightness, wheezing and coughing.  cough is immediately at hs taking pepcid 20 mg / chlorpheniramine 4 mg at hs  rec Please remember to go to the lab department downstairs for your tests - we will call you with the results when they are available.   Try change the pepcid ac 20 mg and chlorpheniramine to 4 mg x 2 and take all  @ 1 hour before bedtime and if don't notice any change at all after 2 full weeks ok to stop    DATE OF ADMISSION: 03/24/2016  DATE OF DISCHARGE: 03/26/2016      ADMISSION DIAGNOSIS:  Acute respiratory failure with hypoxia (HCC) [J96.01]  DISCHARGE DIAGNOSIS:  Principal Problem:  Acute respiratory failure with hypoxia (HCC) Active Problems:  COPD exacerbation (HCC)  Acute bronchitis  Hyperglycemia   SECONDARY DIAGNOSIS:   Past Medical History  Diagnosis Date  . CAD (coronary artery disease)     Dr. [name]  . BPH (benign prostatic hypertrophy)     Dr. [name]  . Interstitial lung disease (HCC)     Dr. [name]  . Allergic rhinitis   . Idiopathic pulmonary fibrosis (HCC)     HOSPITAL COURSE:   #1. Acute respiratory failure with hypoxia,  Continue oxygen therapy, wean off as tolerated #2. COPD exacerbation due to acute bronchitis,Pt have Ideopathic pulmonary fibrosis. steroids intravenously, nebulizing therapy, inhalers, follow clinically  Pt improved next day. #3. Acute bronchitis, initiate patient on levofloxacin orally, sputum cultures sent. #4. Hyperglycemia, HbA1c is 5.7. #5. Chest pain with history of coronary  artery disease, continue aspirin, Crestor, metoprolol, negative cardiac enzymes 3. Chest pain was, however, related to work of breathing and cough, unlikely cardiac #6. Abnormal EKG with trigeminy,  Mg is normal, currently stable.       04/02/2016  f/u ov/Bradley May re: PF on 2 lpm 24/7 / esbiret x 03/2013 and now symb 80 2bid "copd" Chief Complaint  Patient presents with  . Acute Visit    Pt c/o increased SOB x 2 months, worse for the past 2 wks. He also c/o increased cough for the past wk- non prod. He went to [facility] on [facility]15 and was d/ced on home o2 03/26/16.   finished pred taper one day prior to OV  And much better vs admit and no worse since d/c       No obvious day to day or daytime variability or assoc excess/ purulent sputum or mucus plugs  cp or chest tightness, subjective wheeze or overt sinus or hb symptoms. No unusual exp hx or h/o childhood pna/ asthma or knowledge of premature birth.  Sleeping ok without nocturnal  or early am exacerbation  of respiratory  c/o's or need for noct saba. Also denies any obvious fluctuation of symptoms with weather or environmental changes or other aggravating or alleviating factors except as outlined above   Current Medications, Allergies, Complete Past Medical History, Past Surgical History, Family History, and Social History were reviewed in [name].  ROS  The following are not active complaints unless bolded sore throat, dysphagia, dental problems, itching, sneezing,  nasal congestion or excess/ purulent secretions, ear ache,   fever, chills, sweats, unintended wt loss, classically pleuritic or exertional cp, hemoptysis,  orthopnea pnd or leg swelling, presyncope, palpitations, abdominal pain, anorexia, nausea, vomiting, diarrhea  or change in bowel or bladder habits, change in stools or urine, dysuria,hematuria,  rash, arthralgias, visual complaints, headache, numbness, weakness or ataxia or problems with walking or  coordination,  change in mood/affect or memory.            Objective:   Physical Exam  amb slt hoarse wm nad/ harsh cough   04/02/2016        183  11/07/2015        184  08/07/2015      188   05/09/15 185 lb (83.915 kg)  04/26/15 183 lb (83.008 kg)  04/19/15 180 lb 5.4 oz (81.8 kg)    Vital signs reviewed -  note 02 2lpm sats 92%    HEENT: nl dentition, turbinates, and orophanx. Nl external ear canals without cough reflex   NECK :  without JVD/Nodes/TM/  nl carotid upstrokes bilaterally   LUNGS: no acc muscle use, coarsened BV changes with  insp velcro-like  crackles bilaterally but no cough on insp   CV:  RRR  no s3 or murmur or increase in P2, no edema   ABD:  soft and nontender with nl excursion in the supine position. No bruits or organomegaly, bowel sounds nl  MS:  warm without deformities, calf tenderness, cyanosis  And mild  Clubbing both hands  SKIN: warm and dry without lesions    NEURO:  alert, approp, no deficits           I personally reviewed images and agree with radiology impression as follows:  CXR:  03/24/16  1. Advanced pulmonary fibrosis and chronic interstitial lung disease without definite superimposed or acute process.         Labs ordered/ reviewed:      Chemistry      Component Value Date/Time   NA 136 03/25/2016 0237   K 4.0 03/25/2016 0237   CL 104 03/25/2016 0237   CO2 25 03/25/2016 0237   BUN 19 03/25/2016 0237   CREATININE 1.05 03/25/2016 0237      Component Value Date/Time   CALCIUM 8.7* 03/25/2016 0237   ALKPHOS 31* 02/27/2016 0926   AST 20 02/27/2016 0926   ALT 14 02/27/2016 0926   BILITOT 0.5 02/27/2016 0926        Lab Results  Component Value Date   WBC 12.2* 03/25/2016   HGB 12.3* 03/25/2016   HCT 36.3* 03/25/2016   MCV 93.1 03/25/2016   PLT 180 03/25/2016                 Assessment:

## 2016-04-02 NOTE — Patient Instructions (Addendum)
Continue symbicort 80 Take 2 puffs first thing in am and then another 2 puffs about 12 hours later until Sunday 04/07/16 > then stop   Pantoprazole (protonix) 40 mg   Take  30-60 min before first meal of the day and Pepcid (famotidine)  20 mg one @  bedtime until return to office - this is the best way to tell whether stomach acid is contributing to your problem.    Please see patient coordinator before you leave today  to schedule [facility] pulmonary fibrosis  If get worse > Prednisone 10 mg take  4 each am x 2 days,   2 each am x 2 days,  1 each am x 2 days and stop    We will [facility] who can set up the rehab there

## 2016-04-03 ENCOUNTER — Encounter: Payer: Self-pay | Admitting: Internal Medicine

## 2016-04-05 ENCOUNTER — Encounter: Payer: Self-pay | Admitting: Internal Medicine

## 2016-04-07 ENCOUNTER — Encounter: Payer: Self-pay | Admitting: Internal Medicine

## 2016-04-07 DIAGNOSIS — J9611 Chronic respiratory failure with hypoxia: Secondary | ICD-10-CM | POA: Insufficient documentation

## 2016-04-07 NOTE — Assessment & Plan Note (Signed)
rx 2lpm 24/7 as of  03/24/16 d/c [facility]   Adequate control on present rx, reviewed > no change in rx needed  For now but may need higher 02 with ex > goal is to keep over 90%

## 2016-04-07 NOTE — Assessment & Plan Note (Addendum)
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  CT 11/2012:  Subpleural fibrosis, honeycombing (slight progression from 2011).  PFTs   03/16/2013:  VC 3.89 (82%)  DLCO 20.64  63%  Corrects to 89%  Pirfenidone started 03/2013  (check LFT's [id number]) PFTs 04/2014:    FVC  3.68 (78%), no obstruction, TLC 5.26 (75%), DLCO 21.48 (66%) PFTs 05/09/2015  VC 3.55 (76%) no osbt   DLCO  19.41 (60%) - 05/09/2015   Walked RA x 3 laps @ 185 ft each stopped due to  End of study, brisk pace, no sob Sat 91% at end   - 08/07/2015  Walked RA x 3 laps @ 185 ft each stopped due to  End of study, nl pace, no sob or desat  (96% at end)  - 11/07/2015  Walked RA x 3 laps @ 185 ft each stopped due to  End of study, moderate pace, no sob or desat   - ? Flare since May 2017 > admit [place] and placed on 02  - 04/02/2016 refer to [facility]/ pulmonary [place] ? Rehab there  I had an extended final summary discussion with the patient and wife reviewing all relevant studies completed to date and  lasting 25  minutes of a 40 minute transition of care/ post hosp f/u office visit on the following issues:    There is no indication of copd or ab here so ok to stop symbicort - he is clearly progressing on esbriet at this point s/p flare which did seem to respond somewhat to prednisone and may flare again now that he's off it  rec Keep prednisone on hand for worsening Refer to [facility] and our [place] office to make it easier on him re ov's Use of PPI is associated with improved survival time and with decreased radiologic fibrosis per King's study published in AJRCCM vol 184 p1390.  Dec 2011 and also may have other beneficial effects as per the latest review in TelfordAJRCCM vol 193 p1345 Jun 20016.  This may not always be cause and effect, but given how universally unimpressive and expensive  all the other  Drugs developed to day  have been  for pf,   rec start  rx ppi / diet/ lifestyle modification and f/u with serial walking sats and lung volumes for now to put more points on the curve / establish firm baseline before considering additional measures.   Each maintenance medication was reviewed in detail including most importantly the difference between maintenance and as needed and under what circumstances the prns are to be used.  Please see instructions for details which were reviewed in writing and the patient given a copy.

## 2016-04-07 NOTE — Assessment & Plan Note (Signed)
-   Repeat Allergy profile 11/07/2015 >  Eos 0.3 /  IgE  7/ neg RAST   Flared off gerd rx > rec restart Pantoprazole (protonix) 40 mg   Take  30-60 min before first meal of the day and Pepcid (famotidine)  20 mg one @  bedtime until return to office .

## 2016-04-17 ENCOUNTER — Telehealth: Payer: Self-pay | Admitting: Internal Medicine

## 2016-04-17 NOTE — Telephone Encounter (Signed)
-----   Message from Lum Keas sent at 04/16/2016 11:58 AM EDT ----- Regarding: Forms for Dr. Sherene Sires Sending FMLA & STD forms interofc today.

## 2016-04-17 NOTE — Telephone Encounter (Signed)
Rec'd FMLA and STD forms - gave to [facility] for Dr. Sherene Sires to complete -pr

## 2016-04-17 NOTE — Telephone Encounter (Signed)
Rec'd FMLA and short term disability forms from [employer] via fax. Sent forms to [employer] to log and send back for completion by Dr. Sherene Sires -pr

## 2016-04-18 NOTE — Telephone Encounter (Signed)
Pt states that the FMLA forms are due back to his employer by 04/22/16 (Monday) - no later.  Needs these filled out ASAP. Pt aware that I will talk to Dr Sherene Sires to see what can be done and we will call him back. Dr Sherene Sires completed the forms and updated notes have been attached. The chart notes that were printed by [employer] did  not include the the detailed diagnosis and detailed information pertaining to each diagnosis. This has been reprinted and attached to the FMLA/STD notes.  Forms have been given back to MW to complete sections needed to be done.  Will hold in triage until forms are completed.

## 2016-04-18 NOTE — Telephone Encounter (Signed)
Sending forms to [employer]

## 2016-04-18 NOTE — Telephone Encounter (Signed)
Forms completed and given to [facility] to send out through [employer]. Pt is aware that the forms are completed and [employer] should have these by tomorrow and someone from [employer] should contact him to let him know once these are faxed to his employer.   Will send to [facility] to document once she sends this.

## 2016-04-18 NOTE — Telephone Encounter (Signed)
Rec'd completed forms from [place]. Sending interoffice to [employer]-04/18/16-pr

## 2016-04-18 NOTE — Telephone Encounter (Signed)
Forms are in Dr Wert's lookat 

## 2016-04-18 NOTE — Telephone Encounter (Signed)
The questions being asked on the forms required much more detail than what's available from  the chart so options are to return here for this purpose or discuss them with the next pulmonary physician who assesses him either at [facility] or the [place] office.

## 2016-04-24 ENCOUNTER — Ambulatory Visit (INDEPENDENT_AMBULATORY_CARE_PROVIDER_SITE_OTHER): Payer: PPO | Admitting: Internal Medicine

## 2016-04-24 ENCOUNTER — Encounter: Payer: Self-pay | Admitting: Internal Medicine

## 2016-04-24 VITALS — BP 120/72 | HR 70 | Ht 72.0 in | Wt 182.0 lb

## 2016-04-24 DIAGNOSIS — J9611 Chronic respiratory failure with hypoxia: Secondary | ICD-10-CM

## 2016-04-24 DIAGNOSIS — J84112 Idiopathic pulmonary fibrosis: Secondary | ICD-10-CM | POA: Diagnosis not present

## 2016-04-24 DIAGNOSIS — R059 Cough, unspecified: Secondary | ICD-10-CM

## 2016-04-24 DIAGNOSIS — R05 Cough: Secondary | ICD-10-CM | POA: Diagnosis not present

## 2016-04-24 NOTE — Progress Notes (Signed)
[facility] Pulmonary Medicine Consultation      MRN# [id number] Bradley May 18-Apr-1951   CC: Chief Complaint  Patient presents with  . Advice Only    tx from [facility] to [facility]; IPF; cough prod at times w/clear mucus; SOB most of the time, sorse w/excertion      Brief History: 65 year old male, quit smoking in 1994, dyspnea on exertion since 2010, diagnosed with idiopathic pulmonary fibrosis, now on track for lung transplant evaluation at [facility]. Former patient of Dr. [name] and Dr. Sherene Sires   Events since last clinic visit: Patient presents today for a brief follow-up visit and establish care with the [facility] practice. Today complained of shortness of breath, chronic cough, and chronic hypoxemia. Upon arrival he was noted to have an O2 sat in the upper 60s that quickly rebounded to 92% with 2 L of supplemental oxygen. Off note, he has a history of IPF, on Esberit, continued progression of pulmonary fibrosis. His other regimen includes Symbicort which has not been helpful and he stopped taking it. He is a former smoker. He is also on Pepcid 1 tab daily, 20 mg. Review of chart shows that he saw Dr. Sherene Sires on 04/02/16, at that time recently completed prednisone taper and was much better from a respiratory standpoint. Today, wife states at times his oxygen saturation may drop below 80% at night.      Review of Systems  Constitutional: Positive for malaise/fatigue.  HENT: Negative for sore throat.   Eyes: Negative for blurred vision.  Respiratory: Positive for cough, sputum production, shortness of breath and wheezing.   Gastrointestinal: Negative for heartburn.  Genitourinary: Negative for dysuria.  Neurological: Negative for dizziness and headaches.  Endo/Heme/Allergies: Does not bruise/bleed easily.      Allergies:  Atorvastatin and Doxycycline  Physical Examination:  VS: BP 120/72 (BP Location: Left Arm, Cuff Size: Normal)   Pulse 70   Ht 6' (1.829 m)   Wt 182 lb  (82.6 kg)   SpO2 90%   BMI 24.68 kg/m   General Appearance: No distress, wearing 2 L supplemental oxygen HEENT: PERRLA, no ptosis, no other lesions noticed Pulmonary: Coarse upper airway breath sounds, no use of accessory muscles, dry crackles mainly throughout lower third of lung bases, no cough on my examination Cardiovascular:  Normal S1,S2.  No m/r/g.     Abdomen:Exam: Benign, Soft, non-tender, No masses  Skin:   warm, no rashes, no ecchymosis  Extremities: normal, no cyanosis, clubbing, warm with normal capillary refill.      Rad results: (The following images and results were reviewed by Dr. Dema Severin on 04/24/2016). CXR 03/24/16 CLINICAL DATA:  Pt to ED with increased shortness of breath x 1 week, worse last night with cough; known pulmonary fibrosis x 7 years, former former smoker 10+ years ago. Pt denies currently being on at home oxygen.  EXAM: CHEST - 2 VIEW  COMPARISON:  05/09/2015  FINDINGS: Coarse fibrotic interstitial and airspace opacities throughout both lungs, right greater than left, largely stable since prior studies dating back to 04/19/2015. No definite superimposed infiltrate or edema.  Heart size upper limits normal.  No effusion.  Visualized bones unremarkable.  IMPRESSION: 1. Advanced pulmonary fibrosis and chronic interstitial lung disease without definite superimposed or acute process.      Assessment and Plan: 65 year old male with history of chronic hypoxemia, idiopathic primary fibrosis, seen for follow-up visit of IPF and establish care with [facility] pulmonary practice Idiopathic pulmonary fibrosis (HCC) ct 07/2010:  Subpleural fibrosis  primarily in bases, +honeycombing PFT's 11/2010: no obstruction, TLC normal, DLCO 81% Autoimmune panel 2012: unremarkable.  CT 11/2012:  Subpleural fibrosis, honeycombing (slight progression from 2011).  PFTs   03/16/2013:  VC 3.89 (82%)  DLCO 20.64  63%  Corrects to 89%  Pirfenidone started  03/2013  (check LFT's q35mos) PFTs 04/2014:    FVC  3.68 (78%), no obstruction, TLC 5.26 (75%), DLCO 21.48 (66%) PFTs 05/09/2015  VC 3.55 (76%) no osbt   DLCO  19.41 (60%) - 05/09/2015   Walked RA x 3 laps @ 185 ft each stopped due to  End of study, brisk pace, no sob Sat 91% at end   - 08/07/2015  Walked RA x 3 laps @ 185 ft each stopped due to  End of study, nl pace, no sob or desat  (96% at end)  - 11/07/2015  Walked RA x 3 laps @ 185 ft each stopped due to  End of study, moderate pace, no sob or desat   - ? Flare since May 2017 > admit [place] and placed on 02  - 04/02/2016 refer to [facility]/ pulmonary [place]   I have spent 40 minutes during his visit recap he idiopathic pulmonary fibrosis,, causes, progression of disease, review of his medications and current therapies along with expectations a transplant evaluation. Today he stated Symbicort is not working and has since stopped it, we have decided to use a one-month trial of Bevespi today.   Agree with Dr. Sherene Sires, the majority of his pulmonary issues are IPF, and acute exacerbations of IPF. He is clearly progressing on esbriet at this point s/p flare which did seem to respond somewhat to prednisone and may flare again now that he's off it  Plan: Keep prednisone on hand for worsening Keep appointment of [facility]plant [facility] on 06/05/16 Use of PPI or H2 blocker  is associated with improved survival time and with decreased radiologic fibrosis per King's study published in AJRCCM vol 184 p1390.  Dec 2011 and also may have other beneficial effects as per the latest review in [place] vol 193 p1345 Jun 20016.  This may not always be cause and effect, but given how universally unimpressive and expensive  all the other  Drugs developed to day  have been for pf,   rec start  rx H2 blocker / diet/ lifestyle modification and f/u with serial walking sats and lung volumes for now to put more points on the curve / establish firm baseline before considering  additional measures.  -Pepcid 20mg  BID.   Each maintenance medication was reviewed in detail including most importantly the difference between maintenance and as needed and under what circumstances the prns are to be used.  Please see instructions for details which were reviewed in writing and the patient given a copy.    Chronic respiratory failure with hypoxia (HCC) rx 2lpm 24/7 as of  03/24/16 d/c from [facility]   Adequate control on present rx, reviewed > no change in rx needed  For now but may need higher 02 with ex > goal is to keep over 88%  Cough - Repeat Allergy profile 11/07/2015 >  Eos 0.3 /  IgE  7/ neg RAST   Flared off gerd rx > rec Pepcid 20mg  BID.  Take  30-60 min before first meal of the day.    Updated Medication List Outpatient Encounter Prescriptions as of 04/24/2016  Medication Sig  . aspirin EC 81 MG tablet Take 81 mg by mouth daily.  Marland Kitchen  Aspirin Effervescent (ALKA-SELTZER PO) Take by mouth daily as needed.  . budesonide-formoterol (SYMBICORT) 80-4.5 MCG/ACT inhaler Inhale 2 puffs into the lungs 2 (two) times daily.  . Calcium Citrate-Vitamin D (CITRACAL MAXIMUM) 315-250 MG-UNIT TABS Take 1 tablet by mouth daily.  . CRESTOR 40 MG tablet TAKE 1 TABLET EVERY DAY  . Dextromethorphan-Guaifenesin (DELSYM COUGH/CHEST CONGEST DM PO) Take 5 mLs by mouth every 4 (four) hours as needed.  . famotidine (PEPCID) 10 MG tablet Take 20 mg by mouth daily.  . fluticasone (FLONASE) 50 MCG/ACT nasal spray USE TWO PUFFS INTO EACH NOSTRIL DAILY (Patient taking differently: USE TWO PUFFS INTO EACH NOSTRIL DAILY AS NEEDED RHINITIS/ALLERGIES.)  . guaiFENesin (MUCINEX) 600 MG 12 hr tablet Take 1 tablet (600 mg total) by mouth 2 (two) times daily.  . metoprolol succinate (TOPROL-XL) 25 MG 24 hr tablet TAKE 1 TABLET EVERY DAY  . nitroGLYCERIN (NITROSTAT) 0.4 MG SL tablet Place 1 tablet (0.4 mg total) under the tongue every 5 (five) minutes as needed. For chest pain. May repeat 3 times  . OXYGEN 2lpm  24/7 [facility]  . Phenyleph-Doxylamine-DM-APAP (ALKA-SELTZER PLS NIGHT CLD/FLU PO) Take by mouth.  . Pirfenidone (ESBRIET) 267 MG CAPS Take 3 capsules by mouth 3 (three) times daily.  . silodosin (RAPAFLO) 8 MG CAPS capsule Take 8 mg by mouth daily as needed.  . traMADol (ULTRAM) 50 MG tablet TAKE ONE TABLET EVERY SIX HOURS AS NEEDED FOR COUGH  . valsartan (DIOVAN) 80 MG tablet TAKE 1 TABLET EVERY DAY  . [DISCONTINUED] predniSONE (DELTASONE) 10 MG tablet Take  4 each am x 2 days,   2 each am x 2 days,  1 each am x 2 days and stop  . [DISCONTINUED] chlorpheniramine-HYDROcodone (TUSSIONEX) 10-8 MG/5ML SUER Take 5 mLs by mouth every 12 (twelve) hours as needed for cough.  . [DISCONTINUED] pantoprazole (PROTONIX) 40 MG tablet Take 1 tablet (40 mg total) by mouth daily. Take 30-60 min before first meal of the day   No facility-administered encounter medications on file as of 04/24/2016.     Orders for this visit: No orders of the defined types were placed in this encounter.   Thank  you for the visitation and for allowing  [facility] to assist in the care of your patient. Our recommendations are noted above.  Please contact us if we can be of further service.  Stephanie Acre, MD [facility] Office Number: [phone]  Note: This note was prepared with Dragon dictation along with smaller phrase technology. Any transcriptional errors that result from this process are unintentional.

## 2016-04-24 NOTE — Assessment & Plan Note (Signed)
-   Repeat Allergy profile 11/07/2015 >  Eos 0.3 /  IgE  7/ neg RAST   Flared off gerd rx > rec Pepcid 20mg  BID.  Take  30-60 min before first meal of the day.

## 2016-04-24 NOTE — Assessment & Plan Note (Signed)
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  CT 11/2012:  Subpleural fibrosis, honeycombing (slight progression from 2011).  PFTs   03/16/2013:  VC 3.89 (82%)  DLCO 20.64  63%  Corrects to 89%  Pirfenidone started 03/2013  (check LFT's q22mos) PFTs 04/2014:    FVC  3.68 (78%), no obstruction, TLC 5.26 (75%), DLCO 21.48 (66%) PFTs 05/09/2015  VC 3.55 (76%) no osbt   DLCO  19.41 (60%) - 05/09/2015   Walked RA x 3 laps @ 185 ft each stopped due to  End of study, brisk pace, no sob Sat 91% at end   - 08/07/2015  Walked RA x 3 laps @ 185 ft each stopped due to  End of study, nl pace, no sob or desat  (96% at end)  - 11/07/2015  Walked RA x 3 laps @ 185 ft each stopped due to  End of study, moderate pace, no sob or desat   - ? Flare since May 2017 > admit [place] and placed on 02  - 04/02/2016 refer to [facility]/ pulmonary [place]   I have spent 40 minutes during his visit recap he idiopathic pulmonary fibrosis,, causes, progression of disease, review of his medications and current therapies along with expectations a transplant evaluation. Today he stated Symbicort is not working and has since stopped it, we have decided to use a one-month trial of Bevespi today.   Agree with Dr. Sherene Sires, the majority of his pulmonary issues are IPF, and acute exacerbations of IPF. He is clearly progressing on esbriet at this point s/p flare which did seem to respond somewhat to prednisone and may flare again now that he's off it  Plan: Keep prednisone on hand for worsening Keep appointment of [facility] on 06/05/16 Use of PPI or H2 blocker  is associated with improved survival time and with decreased radiologic fibrosis per King's study published in AJRCCM vol 184 p1390.  Dec 2011 and also may have other beneficial effects as per the latest review in [place] vol 193 p1345 Jun 20016.  This may not  always be cause and effect, but given how universally unimpressive and expensive  all the other  Drugs developed to day  have been for pf,   rec start  rx H2 blocker / diet/ lifestyle modification and f/u with serial walking sats and lung volumes for now to put more points on the curve / establish firm baseline before considering additional measures.  -Pepcid 20mg  BID.   Each maintenance medication was reviewed in detail including most importantly the difference between maintenance and as needed and under what circumstances the prns are to be used.  Please see instructions for details which were reviewed in writing and the patient given a copy.

## 2016-04-24 NOTE — Patient Instructions (Signed)
Follow up with Dr. Dema Severin in: 2 months - cont with [facility] Transplant referral  On 06/05/16 @10am  - we will check on your referral for pulm rehab in [place], if there is not one, we will place one for IPF. - cont with supplemental O2, 2L at all times, may increase flow temporarily to keep O2 sats >88% with exertion or desaturations - cont with Pepcid 20mg  - 1 tab PO BID - we will give you a month trial of Bevespi 69mcg/4.8mcg - 2 puff twice a day,-gargle and rinse after each use. If you are getting improvement after 1-2 weeks, then let us know, and we will write a prescription for the med.

## 2016-04-24 NOTE — Assessment & Plan Note (Signed)
rx 2lpm 24/7 as of  03/24/16 d/c from [facility]   Adequate control on present rx, reviewed > no change in rx needed  For now but may need higher 02 with ex > goal is to keep over 88%

## 2016-04-28 ENCOUNTER — Emergency Department: Payer: PPO

## 2016-04-28 ENCOUNTER — Encounter: Payer: Self-pay | Admitting: Emergency Medicine

## 2016-04-28 ENCOUNTER — Inpatient Hospital Stay
Admission: EM | Admit: 2016-04-28 | Discharge: 2016-04-30 | DRG: 196 | Disposition: A | Discharge status: Home / Self Care | Payer: PPO | Attending: Internal Medicine | Admitting: Internal Medicine

## 2016-04-28 DIAGNOSIS — Z9981 Dependence on supplemental oxygen: Secondary | ICD-10-CM | POA: Diagnosis not present

## 2016-04-28 DIAGNOSIS — J811 Chronic pulmonary edema: Secondary | ICD-10-CM | POA: Diagnosis present

## 2016-04-28 DIAGNOSIS — I2510 Atherosclerotic heart disease of native coronary artery without angina pectoris: Secondary | ICD-10-CM | POA: Diagnosis present

## 2016-04-28 DIAGNOSIS — Z8249 Family history of ischemic heart disease and other diseases of the circulatory system: Secondary | ICD-10-CM | POA: Diagnosis not present

## 2016-04-28 DIAGNOSIS — J849 Interstitial pulmonary disease, unspecified: Secondary | ICD-10-CM | POA: Diagnosis present

## 2016-04-28 DIAGNOSIS — R0602 Shortness of breath: Secondary | ICD-10-CM

## 2016-04-28 DIAGNOSIS — J84112 Idiopathic pulmonary fibrosis: Secondary | ICD-10-CM | POA: Diagnosis present

## 2016-04-28 DIAGNOSIS — J9690 Respiratory failure, unspecified, unspecified whether with hypoxia or hypercapnia: Secondary | ICD-10-CM

## 2016-04-28 DIAGNOSIS — Z833 Family history of diabetes mellitus: Secondary | ICD-10-CM | POA: Diagnosis not present

## 2016-04-28 DIAGNOSIS — J309 Allergic rhinitis, unspecified: Secondary | ICD-10-CM | POA: Diagnosis present

## 2016-04-28 DIAGNOSIS — Z7951 Long term (current) use of inhaled steroids: Secondary | ICD-10-CM

## 2016-04-28 DIAGNOSIS — F419 Anxiety disorder, unspecified: Secondary | ICD-10-CM | POA: Diagnosis present

## 2016-04-28 DIAGNOSIS — Z87891 Personal history of nicotine dependence: Secondary | ICD-10-CM | POA: Diagnosis not present

## 2016-04-28 DIAGNOSIS — Z66 Do not resuscitate: Secondary | ICD-10-CM | POA: Diagnosis present

## 2016-04-28 DIAGNOSIS — Z888 Allergy status to other drugs, medicaments and biological substances status: Secondary | ICD-10-CM | POA: Diagnosis not present

## 2016-04-28 DIAGNOSIS — Z881 Allergy status to other antibiotic agents status: Secondary | ICD-10-CM

## 2016-04-28 DIAGNOSIS — J9621 Acute and chronic respiratory failure with hypoxia: Secondary | ICD-10-CM | POA: Diagnosis present

## 2016-04-28 DIAGNOSIS — Z79899 Other long term (current) drug therapy: Secondary | ICD-10-CM

## 2016-04-28 DIAGNOSIS — Z7982 Long term (current) use of aspirin: Secondary | ICD-10-CM | POA: Diagnosis not present

## 2016-04-28 DIAGNOSIS — E785 Hyperlipidemia, unspecified: Secondary | ICD-10-CM | POA: Diagnosis present

## 2016-04-28 DIAGNOSIS — Z955 Presence of coronary angioplasty implant and graft: Secondary | ICD-10-CM

## 2016-04-28 DIAGNOSIS — R609 Edema, unspecified: Secondary | ICD-10-CM

## 2016-04-28 DIAGNOSIS — N400 Enlarged prostate without lower urinary tract symptoms: Secondary | ICD-10-CM | POA: Diagnosis present

## 2016-04-28 DIAGNOSIS — I10 Essential (primary) hypertension: Secondary | ICD-10-CM | POA: Diagnosis present

## 2016-04-28 DIAGNOSIS — J9620 Acute and chronic respiratory failure, unspecified whether with hypoxia or hypercapnia: Secondary | ICD-10-CM | POA: Diagnosis present

## 2016-04-28 DIAGNOSIS — B370 Candidal stomatitis: Secondary | ICD-10-CM | POA: Diagnosis present

## 2016-04-28 DIAGNOSIS — R0600 Dyspnea, unspecified: Secondary | ICD-10-CM | POA: Diagnosis not present

## 2016-04-28 DIAGNOSIS — Z8042 Family history of malignant neoplasm of prostate: Secondary | ICD-10-CM

## 2016-04-28 DIAGNOSIS — J8410 Pulmonary fibrosis, unspecified: Secondary | ICD-10-CM | POA: Diagnosis not present

## 2016-04-28 LAB — CBC WITH DIFFERENTIAL/PLATELET
BASOS ABS: 0 10*3/uL (ref 0–0.1)
BASOS PCT: 0 %
EOS ABS: 0.5 10*3/uL (ref 0–0.7)
Eosinophils Relative: 5 %
HCT: 42.3 % (ref 40.0–52.0)
HEMOGLOBIN: 14.6 g/dL (ref 13.0–18.0)
Lymphocytes Relative: 6 %
Lymphs Abs: 0.6 10*3/uL — ABNORMAL LOW (ref 1.0–3.6)
MCH: 31.9 pg (ref 26.0–34.0)
MCHC: 34.5 g/dL (ref 32.0–36.0)
MCV: 92.6 fL (ref 80.0–100.0)
Monocytes Absolute: 0.8 10*3/uL (ref 0.2–1.0)
Monocytes Relative: 8 %
NEUTROS PCT: 81 %
Neutro Abs: 8.2 10*3/uL — ABNORMAL HIGH (ref 1.4–6.5)
Platelets: 216 10*3/uL (ref 150–440)
RBC: 4.57 MIL/uL (ref 4.40–5.90)
RDW: 12.8 % (ref 11.5–14.5)
WBC: 10.2 10*3/uL (ref 3.8–10.6)

## 2016-04-28 LAB — COMPREHENSIVE METABOLIC PANEL
ALBUMIN: 3.8 g/dL (ref 3.5–5.0)
ALK PHOS: 42 U/L (ref 38–126)
ALT: 17 U/L (ref 17–63)
ANION GAP: 8 (ref 5–15)
AST: 26 U/L (ref 15–41)
BUN: 8 mg/dL (ref 6–20)
CALCIUM: 9.4 mg/dL (ref 8.9–10.3)
CO2: 29 mmol/L (ref 22–32)
Chloride: 97 mmol/L — ABNORMAL LOW (ref 101–111)
Creatinine, Ser: 0.92 mg/dL (ref 0.61–1.24)
GFR calc Af Amer: >60 mL/min (ref >60)
GFR calc non Af Amer: >60 mL/min (ref >60)
GLUCOSE: 116 mg/dL — AB (ref 65–99)
Potassium: 4.1 mmol/L (ref 3.5–5.1)
SODIUM: 134 mmol/L — AB (ref 135–145)
Total Bilirubin: 0.6 mg/dL (ref 0.3–1.2)
Total Protein: 8.3 g/dL — ABNORMAL HIGH (ref 6.5–8.1)

## 2016-04-28 LAB — LACTIC ACID, PLASMA
LACTIC ACID, VENOUS: 0.9 mmol/L (ref 0.5–1.9)
Lactic Acid, Venous: 2.1 mmol/L (ref 0.5–1.9)

## 2016-04-28 LAB — TROPONIN I: Troponin I: <0.03 ng/mL (ref <0.03)

## 2016-04-28 LAB — BRAIN NATRIURETIC PEPTIDE: B Natriuretic Peptide: 180 pg/mL — ABNORMAL HIGH (ref 0.0–100.0)

## 2016-04-28 MED ORDER — HYDROCODONE-ACETAMINOPHEN 5-325 MG PO TABS
1.0000 | ORAL_TABLET | ORAL | Status: DC | PRN
  Administered 2016-04-28 – 2016-04-30 (×3): 1 via ORAL
  Filled 2016-04-28 (×3): qty 1

## 2016-04-28 MED ORDER — METHYLPREDNISOLONE SODIUM SUCC 125 MG IJ SOLR: 80.0000 mg | INTRAMUSCULAR | Status: DC

## 2016-04-28 MED ORDER — LEVALBUTEROL HCL 1.25 MG/0.5ML IN NEBU
1.2500 mg | INHALATION_SOLUTION | Freq: Four times a day (QID) | RESPIRATORY_TRACT | Status: DC
  Administered 2016-04-28 – 2016-04-29 (×5): 1.25 mg via RESPIRATORY_TRACT
  Filled 2016-04-28 (×6): qty 0.5

## 2016-04-28 MED ORDER — ROSUVASTATIN CALCIUM 20 MG PO TABS
40.0000 mg | ORAL_TABLET | Freq: Every day | ORAL | Status: DC
  Administered 2016-04-29 – 2016-04-30 (×2): 40 mg via ORAL
  Filled 2016-04-28 (×2): qty 2

## 2016-04-28 MED ORDER — METOPROLOL SUCCINATE ER 25 MG PO TB24
25.0000 mg | ORAL_TABLET | Freq: Every day | ORAL | Status: DC
  Administered 2016-04-29 – 2016-04-30 (×2): 25 mg via ORAL
  Filled 2016-04-28 (×2): qty 1

## 2016-04-28 MED ORDER — CALCIUM CITRATE-VITAMIN D 500-400 MG-UNIT PO CHEW
1.0000 | CHEWABLE_TABLET | Freq: Every day | ORAL | Status: DC
  Administered 2016-04-28 – 2016-04-29 (×2): 1 via ORAL
  Filled 2016-04-28 (×2): qty 1

## 2016-04-28 MED ORDER — NITROGLYCERIN 0.4 MG SL SUBL: 0.4000 mg | SUBLINGUAL_TABLET | SUBLINGUAL | Status: DC | PRN

## 2016-04-28 MED ORDER — FUROSEMIDE 10 MG/ML IJ SOLN
20.0000 mg | Freq: Two times a day (BID) | INTRAMUSCULAR | Status: DC
  Administered 2016-04-28 – 2016-04-29 (×2): 20 mg via INTRAVENOUS
  Filled 2016-04-28 (×2): qty 2

## 2016-04-28 MED ORDER — FUROSEMIDE 10 MG/ML IJ SOLN
40.0000 mg | Freq: Once | INTRAMUSCULAR | Status: AC
  Administered 2016-04-28: 40 mg via INTRAVENOUS
  Filled 2016-04-28: qty 4

## 2016-04-28 MED ORDER — FUROSEMIDE 10 MG/ML IJ SOLN: 20.0000 mg | Freq: Two times a day (BID) | INTRAMUSCULAR | Status: DC

## 2016-04-28 MED ORDER — HYDROCODONE-ACETAMINOPHEN 5-325 MG PO TABS: 1.0000 | ORAL_TABLET | ORAL | Status: DC | PRN

## 2016-04-28 MED ORDER — PIRFENIDONE 267 MG PO CAPS
3.0000 | ORAL_CAPSULE | Freq: Three times a day (TID) | ORAL | Status: DC
  Administered 2016-04-28 – 2016-04-30 (×5): 3 via ORAL
  Filled 2016-04-28 (×4): qty 3

## 2016-04-28 MED ORDER — BUDESONIDE 0.25 MG/2ML IN SUSP
0.2500 mg | Freq: Four times a day (QID) | RESPIRATORY_TRACT | Status: DC
  Administered 2016-04-28 – 2016-04-29 (×5): 0.25 mg via RESPIRATORY_TRACT
  Filled 2016-04-28 (×6): qty 2

## 2016-04-28 MED ORDER — IRBESARTAN 75 MG PO TABS
37.5000 mg | ORAL_TABLET | Freq: Every day | ORAL | Status: DC
  Administered 2016-04-29: 37.5 mg via ORAL
  Filled 2016-04-28: qty 0.5

## 2016-04-28 MED ORDER — HEPARIN SODIUM (PORCINE) 5000 UNIT/ML IJ SOLN
5000.0000 [IU] | Freq: Three times a day (TID) | INTRAMUSCULAR | Status: DC
  Administered 2016-04-28 – 2016-04-29 (×3): 5000 [IU] via SUBCUTANEOUS
  Filled 2016-04-28 (×3): qty 1

## 2016-04-28 MED ORDER — MOMETASONE FURO-FORMOTEROL FUM 100-5 MCG/ACT IN AERO
2.0000 | INHALATION_SPRAY | Freq: Two times a day (BID) | RESPIRATORY_TRACT | Status: DC
  Administered 2016-04-28: 2 via RESPIRATORY_TRACT
  Filled 2016-04-28: qty 8.8

## 2016-04-28 MED ORDER — FAMOTIDINE 20 MG PO TABS
20.0000 mg | ORAL_TABLET | Freq: Every day | ORAL | Status: DC
  Administered 2016-04-29 – 2016-04-30 (×2): 20 mg via ORAL
  Filled 2016-04-28 (×2): qty 1

## 2016-04-28 MED ORDER — SODIUM CHLORIDE 0.9 % IV SOLN: 250.0000 mL | INTRAVENOUS | Status: DC | PRN

## 2016-04-28 MED ORDER — SODIUM CHLORIDE 0.9% FLUSH: 3.0000 mL | INTRAVENOUS | Status: DC | PRN

## 2016-04-28 MED ORDER — LEVOFLOXACIN 500 MG PO TABS
500.0000 mg | ORAL_TABLET | Freq: Every day | ORAL | Status: DC
  Administered 2016-04-28 – 2016-04-30 (×3): 500 mg via ORAL
  Filled 2016-04-28 (×3): qty 1

## 2016-04-28 MED ORDER — METHYLPREDNISOLONE SODIUM SUCC 125 MG IJ SOLR
125.0000 mg | Freq: Once | INTRAMUSCULAR | Status: AC
  Administered 2016-04-28: 125 mg via INTRAVENOUS
  Filled 2016-04-28: qty 2

## 2016-04-28 MED ORDER — IPRATROPIUM BROMIDE 0.02 % IN SOLN
0.5000 mg | Freq: Four times a day (QID) | RESPIRATORY_TRACT | Status: DC
  Administered 2016-04-28 – 2016-04-29 (×5): 0.5 mg via RESPIRATORY_TRACT
  Filled 2016-04-28 (×6): qty 2.5

## 2016-04-28 MED ORDER — ASPIRIN EC 81 MG PO TBEC
81.0000 mg | DELAYED_RELEASE_TABLET | Freq: Every day | ORAL | Status: DC
  Administered 2016-04-29: 10:00:00 81 mg via ORAL
  Filled 2016-04-28: qty 1

## 2016-04-28 MED ORDER — PIRFENIDONE 267 MG PO CAPS
3.0000 | ORAL_CAPSULE | Freq: Three times a day (TID) | ORAL | Status: DC
  Filled 2016-04-28: qty 3

## 2016-04-28 MED ORDER — GUAIFENESIN ER 600 MG PO TB12
600.0000 mg | ORAL_TABLET | Freq: Two times a day (BID) | ORAL | Status: DC
  Administered 2016-04-28 – 2016-04-30 (×4): 600 mg via ORAL
  Filled 2016-04-28 (×4): qty 1

## 2016-04-28 MED ORDER — METHYLPREDNISOLONE SODIUM SUCC 125 MG IJ SOLR
80.0000 mg | Freq: Two times a day (BID) | INTRAMUSCULAR | Status: DC
  Administered 2016-04-28 – 2016-04-29 (×2): 80 mg via INTRAVENOUS
  Filled 2016-04-28 (×2): qty 2

## 2016-04-28 MED ORDER — SODIUM CHLORIDE 0.9% FLUSH
3.0000 mL | Freq: Two times a day (BID) | INTRAVENOUS | Status: DC
  Administered 2016-04-28 – 2016-04-30 (×5): 3 mL via INTRAVENOUS

## 2016-04-28 NOTE — ED Notes (Signed)
Attempted to call report to the floor, nurse in restroom will call back to get report

## 2016-04-28 NOTE — ED Provider Notes (Signed)
[facility] Emergency Department Provider Note   ____________________________________________    I have reviewed the triage vital signs and the nursing notes.   HISTORY  Chief Complaint Shortness of Breath     HPI Bradley PeonFloyd G Pavlik Jr. is a 65 y.o. male who presents with shortness of breath. Patient has a history of idiopathic pulmonary fibrosis for which he recently saw Dr. Dema SeverinMungal for the first time. In the past he has followed with Dr. Sherene SiresWert in [place] but is transferring his care closer to [place] in [place]. He had been doing well last week on his 2 L of home oxygen but over the last 2 days he has rapidly declined per his wife. Today after taking a few steps across his bedroom his oxygen saturation was 67%. He feels extremely exhausted. He denies fever. He has a chronic dry cough. He complains of aching in his right chest. He has never had a blood clot.   Past Medical History:  Diagnosis Date  . Allergic rhinitis   . BPH (benign prostatic hypertrophy)    Dr. Achilles Dunkope  . CAD (coronary artery disease)    Dr. Daleen SquibbWall  . Idiopathic pulmonary fibrosis (HCC)   . Interstitial lung disease (HCC)    Dr. Shelle Ironlance    Patient Active Problem List   Diagnosis Date Noted  . Chronic respiratory failure with hypoxia (HCC) 04/07/2016  . COPD exacerbation (HCC) 03/24/2016  . Acute bronchitis 03/24/2016  . Acute respiratory failure with hypoxia (HCC) 03/24/2016  . Hyperglycemia 03/24/2016  . Low back pain 10/18/2015  . PSVT (paroxysmal supraventricular tachycardia) (HCC) 04/26/2015  . Abdominal aortic aneurysm (HCC) 03/03/2015  . Essential hypertension 03/03/2015  . Old MI (myocardial infarction) 05/18/2012  . Routine general medical examination at a health care facility 01/31/2012  . Cough 12/03/2011  . BPH (benign prostatic hypertrophy)   . ACTINIC KERATOSIS 11/21/2009  . Nonallergic rhinitis 05/10/2008  . Idiopathic pulmonary fibrosis (HCC) 01/13/2008  .  HYPERLIPIDEMIA 05/07/2007  . Coronary atherosclerosis of native coronary artery 05/07/2007    Past Surgical History:  Procedure Laterality Date  . ANGIOPLASTY  1992  . CORONARY ANGIOPLASTY WITH STENT PLACEMENT  01/2001   RCA  . ESOPHAGOGASTRODUODENOSCOPY  01/2001   bleeding  . KNEE SURGERY  1978   Right  . URETERAL STENT PLACEMENT  01/2009   Dr. Achilles Dunkope    Prior to Admission medications   Medication Sig Start Date End Date Taking? Authorizing Provider  aspirin EC 81 MG tablet Take 81 mg by mouth daily.    Historical Provider, MD  Aspirin Effervescent (ALKA-SELTZER PO) Take by mouth daily as needed.    Historical Provider, MD  budesonide-formoterol (SYMBICORT) 80-4.5 MCG/ACT inhaler Inhale 2 puffs into the lungs 2 (two) times daily. 03/26/16   Altamese DillingVaibhavkumar Vachhani, MD  Calcium Citrate-Vitamin D (CITRACAL MAXIMUM) 315-250 MG-UNIT TABS Take 1 tablet by mouth daily.    Historical Provider, MD  CRESTOR 40 MG tablet TAKE 1 TABLET EVERY DAY 06/05/15   Karie Schwalbeichard I Letvak, MD  Dextromethorphan-Guaifenesin (DELSYM COUGH/CHEST CONGEST DM PO) Take 5 mLs by mouth every 4 (four) hours as needed.    Historical Provider, MD  famotidine (PEPCID) 10 MG tablet Take 20 mg by mouth daily.    Historical Provider, MD  fluticasone (FLONASE) 50 MCG/ACT nasal spray USE TWO PUFFS INTO EACH NOSTRIL DAILY Patient taking differently: USE TWO PUFFS INTO EACH NOSTRIL DAILY AS NEEDED RHINITIS/ALLERGIES. 01/13/15   Karie Schwalbeichard I Letvak, MD  guaiFENesin (MUCINEX) 600 MG 12  hr tablet Take 1 tablet (600 mg total) by mouth 2 (two) times daily. 03/26/16   Altamese Dilling, MD  metoprolol succinate (TOPROL-XL) 25 MG 24 hr tablet TAKE 1 TABLET EVERY DAY 03/14/16   [name], MD  nitroGLYCERIN (NITROSTAT) 0.4 MG SL tablet Place 1 tablet (0.4 mg total) under the tongue every 5 (five) minutes as needed. For chest pain. May repeat 3 times 02/28/16   Lewayne Bunting, MD  OXYGEN 2lpm 24/7 [facility]    Historical Provider, MD    Phenyleph-Doxylamine-DM-APAP (ALKA-SELTZER PLS NIGHT CLD/FLU PO) Take by mouth.    Historical Provider, MD  Pirfenidone (ESBRIET) 267 MG CAPS Take 3 capsules by mouth 3 (three) times daily. 05/24/14   Barbaraann Share, MD  silodosin (RAPAFLO) 8 MG CAPS capsule Take 8 mg by mouth daily as needed. 04/07/15   Historical Provider, MD  traMADol (ULTRAM) 50 MG tablet TAKE ONE TABLET EVERY SIX HOURS AS NEEDED FOR COUGH 11/07/15   Nyoka Cowden, MD  valsartan (DIOVAN) 80 MG tablet TAKE 1 TABLET EVERY DAY 03/13/16   Lewayne Bunting, MD     Allergies Atorvastatin and Doxycycline  Family History  Problem Relation Age of Onset  . Coronary artery disease Father   . Diabetes Father   . Hypertension Father   . Cancer Father     prostate  . Hypertension Mother     Social History Social History  Substance Use Topics  . Smoking status: Former Smoker    Packs/day: 1.00    Years: 25.00    Types: Cigarettes    Quit date: 09/23/1990  . Smokeless tobacco: Never Used  . Alcohol use Yes     Comment: Social    Review of Systems  Constitutional: No fever/chills   Cardiovascular: Aching right chest Respiratory: As above Gastrointestinal: No abdominal pain.  No nausea, no vomiting.    Musculoskeletal: Negative for back pain. Skin: Negative for rash. Neurological: Negative for focal weakness  10-point ROS otherwise negative.  ____________________________________________   PHYSICAL EXAM:  VITAL SIGNS: ED Triage Vitals  Enc Vitals Group     BP 04/28/16 0919 120/77     Pulse Rate 04/28/16 0916 96     Resp 04/28/16 0916 (!) 26     Temp --      Temp Source 04/28/16 0916 Oral     SpO2 04/28/16 0916 (!) 82 %     Weight 04/28/16 0916 185 lb (83.9 kg)     Height 04/28/16 0916 6' (1.829 m)     Head Circumference --      Peak Flow --      Pain Score --      Pain Loc --      Pain Edu? --      Excl. in GC? --     Constitutional: Alert and oriented.Moderate distress, uncomfortable appearing,  sitting up very straight Eyes: Conjunctivae are normal.  Head: Atraumatic. Nose: No congestion/rhinnorhea. Mouth/Throat: Mucous membranes are moist.    Cardiovascular: Normal rate, regular rhythm. Grossly normal heart sounds.  Good peripheral circulation. Respiratory: Increased respiratory effort, tachypnea, rales throughout Gastrointestinal: Soft and nontender. No distention.  No CVA tenderness. Genitourinary: deferred Musculoskeletal: 1+ edema bilaterally. Warm and well perfused Neurologic:  Normal speech and language. No gross focal neurologic deficits are appreciated.  Skin:  Skin is warm, dry and intact. No rash noted. Psychiatric: Mood and affect are normal. Speech and behavior are normal.  ____________________________________________   LABS (all labs ordered are listed,  but only abnormal results are displayed)  Labs Reviewed  CBC WITH DIFFERENTIAL/PLATELET - Abnormal; Notable for the following:       Result Value   Neutro Abs 8.2 (*)    Lymphs Abs 0.6 (*)    All other components within normal limits  COMPREHENSIVE METABOLIC PANEL - Abnormal; Notable for the following:    Sodium 134 (*)    Chloride 97 (*)    Glucose, Bld 116 (*)    Total Protein 8.3 (*)    All other components within normal limits  LACTIC ACID, PLASMA - Abnormal; Notable for the following:    Lactic Acid, Venous 2.1 (*)    All other components within normal limits  BRAIN NATRIURETIC PEPTIDE - Abnormal; Notable for the following:    B Natriuretic Peptide 180.0 (*)    All other components within normal limits  CULTURE, BLOOD (ROUTINE X 2)  CULTURE, BLOOD (ROUTINE X 2)  TROPONIN I  LACTIC ACID, PLASMA   ____________________________________________  EKG  ED ECG REPORT I, Jene Every, the attending physician, personally viewed and interpreted this ECG.  Date: 04/28/2016 EKG Time: 9:22 AM Rate: 82 Rhythm: normal sinus rhythm QRS Axis: normal Intervals: normal ST/T Wave abnormalities:  normal Conduction Disturbances: none Narrative Interpretation: unremarkable  ____________________________________________  RADIOLOGY  Chest x-ray shows interstitial edema ____________________________________________   PROCEDURES  Procedure(s) performed: No    Critical Care performed: No ____________________________________________   INITIAL IMPRESSION / ASSESSMENT AND PLAN / ED COURSE  Pertinent labs & imaging results that were available during my care of the patient were reviewed by me and considered in my medical decision making (see chart for details).  Patient with a history of IPF presents with worsening shortness of breath. His wife reports in the past he has had significant improvement from IV Solu-Medrol. We will obtain labs, including blood cultures and lactic acid, chest x-ray and provide IV solu Medrol while he closely monitor this patient. Differential includes infection, worsening IPF, COPD exacerbation, PE or CHF  Clinical Course  Patient with Interstitial edema on cxr, concerning for CHF. Will wait for CMP before giving lasix ____________________________________________ ----------------------------------------- 10:24 AM on 04/28/2016 -----------------------------------------  Normal creatinine, I'll give IV Lasix. Discussed with patient and wife the findings on chest x-ray. This is new for him. Recommended admission for further workup and diuresis  FINAL CLINICAL IMPRESSION(S) / ED DIAGNOSES  Final diagnoses:  SOB (shortness of breath)  Idiopathic pulmonary fibrosis (HCC)  Interstitial edema      NEW MEDICATIONS STARTED DURING THIS VISIT:  New Prescriptions   No medications on file     Note:  This document was prepared using Dragon voice recognition software and may include unintentional dictation errors.    Jene Every, MD 04/28/16 1026

## 2016-04-28 NOTE — Progress Notes (Signed)
Pt's home medication, Pirfenidone taken to pharmacy to be stored and dispensed during hospitalization. Pt aware.

## 2016-04-28 NOTE — H&P (Signed)
Bradley May. is an 65 y.o. male.   Chief Complaint: Shortness of breath HPI: Has history of idiopathic pulmonary fibrosis. Was on a steroid taper till about two weeks ago. Since then has developed worsening shortness of breath and orthopneic symptoms. Past two days has become worse. Can barely speak in complete sentences.  Past Medical History:  Diagnosis Date  . Allergic rhinitis   . BPH (benign prostatic hypertrophy)    Dr. Jacqlyn Larsen  . CAD (coronary artery disease)    Dr. Verl Blalock  . Idiopathic pulmonary fibrosis ([place])   . Interstitial lung disease (HCC)    Dr. Gwenette Greet    Past Surgical History:  Procedure Laterality Date  . ANGIOPLASTY  1992  . CORONARY ANGIOPLASTY WITH STENT PLACEMENT  01/2001   RCA  . ESOPHAGOGASTRODUODENOSCOPY  01/2001   bleeding  . KNEE SURGERY  1978   Right  . URETERAL STENT PLACEMENT  01/2009   Dr. Jacqlyn Larsen    Family History  Problem Relation Age of Onset  . Coronary artery disease Father   . Diabetes Father   . Hypertension Father   . Cancer Father     prostate  . Hypertension Mother    Social History:  reports that he quit smoking about 25 years ago. His smoking use included Cigarettes. He has a 25.00 pack-year smoking history. He has never used smokeless tobacco. He reports that he drinks alcohol. He reports that he does not use drugs.  Allergies:  Allergies  Allergen Reactions  . Atorvastatin     REACTION: Headache  . Doxycycline     REACTION: Nausea     (Not in a hospital admission)  Results for orders placed or performed during the hospital encounter of 04/28/16 (from the past 48 hour(s))  CBC with Differential     Status: Abnormal   Collection Time: 04/28/16  9:23 AM  Result Value Ref Range   WBC 10.2 3.8 - 10.6 K/uL   RBC 4.57 4.40 - 5.90 MIL/uL   Hemoglobin 14.6 13.0 - 18.0 g/dL   HCT 42.3 40.0 - 52.0 %   MCV 92.6 80.0 - 100.0 fL   MCH 31.9 26.0 - 34.0 pg   MCHC 34.5 32.0 - 36.0 g/dL   RDW 12.8 11.5 - 14.5 %   Platelets 216  150 - 440 K/uL   Neutrophils Relative % 81 %   Neutro Abs 8.2 (H) 1.4 - 6.5 K/uL   Lymphocytes Relative 6 %   Lymphs Abs 0.6 (L) 1.0 - 3.6 K/uL   Monocytes Relative 8 %   Monocytes Absolute 0.8 0.2 - 1.0 K/uL   Eosinophils Relative 5 %   Eosinophils Absolute 0.5 0 - 0.7 K/uL   Basophils Relative 0 %   Basophils Absolute 0.0 0 - 0.1 K/uL  Comprehensive metabolic panel     Status: Abnormal   Collection Time: 04/28/16  9:23 AM  Result Value Ref Range   Sodium 134 (L) 135 - 145 mmol/L   Potassium 4.1 3.5 - 5.1 mmol/L   Chloride 97 (L) 101 - 111 mmol/L   CO2 29 22 - 32 mmol/L   Glucose, Bld 116 (H) 65 - 99 mg/dL   BUN 8 6 - 20 mg/dL   Creatinine, Ser 0.92 0.61 - 1.24 mg/dL   Calcium 9.4 8.9 - 10.3 mg/dL   Total Protein 8.3 (H) 6.5 - 8.1 g/dL   Albumin 3.8 3.5 - 5.0 g/dL   AST 26 15 - 41 U/L   ALT 17 17 -  63 U/L   Alkaline Phosphatase 42 38 - 126 U/L   Total Bilirubin 0.6 0.3 - 1.2 mg/dL   GFR calc non Af Amer >60 >60 mL/min   GFR calc Af Amer >60 >60 mL/min    Comment: (NOTE) The eGFR has been calculated using the CKD EPI equation. This calculation has not been validated in all clinical situations. eGFR's persistently <60 mL/min signify possible Chronic Kidney Disease.    Anion gap 8 5 - 15  Troponin I     Status: None   Collection Time: 04/28/16  9:23 AM  Result Value Ref Range   Troponin I <0.03 <0.03 ng/mL  Brain natriuretic peptide     Status: Abnormal   Collection Time: 04/28/16  9:23 AM  Result Value Ref Range   B Natriuretic Peptide 180.0 (H) 0.0 - 100.0 pg/mL  Lactic acid, plasma     Status: Abnormal   Collection Time: 04/28/16  9:43 AM  Result Value Ref Range   Lactic Acid, Venous 2.1 (HH) 0.5 - 1.9 mmol/L    Comment: CRITICAL RESULT CALLED TO, READ BACK BY AND VERIFIED WITH ANGELA ROBBINS @ 1016 05/08/16  BY [place]    Dg Chest Portable 1 View  Result Date: 04/28/2016 CLINICAL DATA:  Worsening shortness breath yesterday and today. History of pulmonary fibrosis.  EXAM: PORTABLE CHEST 1 VIEW COMPARISON:  Chest x-rays dated 03/24/2016 and 05/09/2015. FINDINGS: Cardiomegaly is stable. Overall cardiomediastinal silhouette appears stable in size and configuration. There is increased interstitial prominence, presumably edema, superimposed on chronic pulmonary fibrosis. No new confluent airspace opacity to suggest a developing pneumonia. No pleural effusion or pneumothorax seen. Osseous structures about the chest are unremarkable. IMPRESSION: 1. Acute interstitial edema, suggesting CHF/volume overload, superimposed on chronic fibrosis. 2. No evidence of pneumonia seen. Electronically Signed   By: Franki Cabot M.D.   On: 04/28/2016 09:42    Review of Systems  Constitutional: Negative for chills and fever.  HENT: Negative for hearing loss.   Eyes: Negative for blurred vision.  Respiratory: Positive for cough and shortness of breath.   Cardiovascular: Positive for chest pain.  Gastrointestinal: Negative for nausea and vomiting.  Genitourinary: Negative for dysuria.  Musculoskeletal: Positive for joint pain.  Skin: Negative for rash.  Neurological: Negative for sensory change.    Blood pressure 120/77, pulse 96, resp. rate (!) 26, height 6' (1.829 m), weight 83.9 kg (185 lb), SpO2 (!) 82 %. Physical Exam  Constitutional: He is oriented to person, place, and time. He appears well-developed and well-nourished.  Moderate respiratory distress  HENT:  Head: Normocephalic and atraumatic.  Mouth/Throat: Oropharynx is clear and moist. No oropharyngeal exudate.  Eyes: Pupils are equal, round, and reactive to light. Left eye exhibits no discharge. No scleral icterus.  Neck: Neck supple. No JVD present. No thyromegaly present.  Cardiovascular: Normal rate and regular rhythm.   Murmur heard. Respiratory:  Course breath sound throughout.Using accessary muscles. No dullness to percusion   GI: Soft. Bowel sounds are normal. He exhibits no distension and no mass.   Musculoskeletal: He exhibits no edema, tenderness or deformity.  Neurological: He is alert and oriented to person, place, and time. No cranial nerve deficit.  Skin: Skin is warm and dry. No erythema.     Assessment/Plan 1. Acute on Chronic Respiratory Failure: Will increase supplamental oxygen. Treat underlying cause.  2. Idiopathic Pulmonary Fibrosis: Will start IV steroids, aggressive bronchodilator therapy. Continue pirfenidone. Consult pulmonology. Discussed case with Dr Jamal Collin.  3. Pulmonary Edema: No documented  history of CHF. Has EF of 46% on nuclear medicine scan a couple of months ago. Will diurese.  4. CAD: Continue current meds.  5. Code Status: Patient wishes to be DNR/DNI.  Time spent= 45 min  Baxter Hire, MD 04/28/2016, 11:06 AM

## 2016-04-28 NOTE — Consult Note (Signed)
PULMONARY CONSULT NOTE  Requesting MD/Service: [facility] Date of initial consultation: 08/06 Reason for consultation: Acute on chronic hypoxic respiratory failure, IPF  HPI:  7 M with history of IPF first diagnosed 2009 with chronic hypoxic respiratory failure, followed by Dr Sherene Sires and more recently seen by Dr Dema Severin. He has been referred to [facility] for transplant evaluation which is scheduled for next month. He was hospitalized 07/02 - 07/04 with worsening respiratory failure. He as discharged on a brief prednisone taper. He never fully recovered back to baseline. He is now admitted for increasing dyspnea and hypoxemia (he owns a pulse oximeter). He denies CP, fever, purulent sputum, hemoptysis, LE edema and calf tenderness. He has chronic nonproductive cough and describes a sensation of burning in his chest that is distinctly different than his previous anginal pain.   Past Medical History:  Diagnosis Date  . Allergic rhinitis   . BPH (benign prostatic hypertrophy)    Dr. [name]  . CAD (coronary artery disease)    Dr. [name]  . Idiopathic pulmonary fibrosis (HCC)   . Interstitial lung disease (HCC)    Dr. [name]    Past Surgical History:  Procedure Laterality Date  . ANGIOPLASTY  1992  . CORONARY ANGIOPLASTY WITH STENT PLACEMENT  01/2001   RCA  . ESOPHAGOGASTRODUODENOSCOPY  01/2001   bleeding  . KNEE SURGERY  1978   Right  . URETERAL STENT PLACEMENT  01/2009   Dr. [name]    MEDICATIONS: I have reviewed all medications and confirmed regimen as documented  Social History   Social History  . Marital status: Married    Spouse name: N/A  . Number of children: 1  . Years of education: N/A   Occupational History  . Risk analyst    Social History Main Topics  . Smoking status: Former Smoker    Packs/day: 1.00    Years: 25.00    Types: Cigarettes    Quit date: 09/23/1990  . Smokeless tobacco: Never Used  . Alcohol use Yes     Comment: Social  . Drug use: No  . Sexual  activity: Not on file   Other Topics Concern  . Not on file   Social History Narrative   No living will    No health care POA--but would request wife   Would accept resuscitation   No tube feeds if cognitively unaware    Family History  Problem Relation Age of Onset  . Coronary artery disease Father   . Diabetes Father   . Hypertension Father   . Cancer Father     prostate  . Hypertension Mother     ROS: No fever, myalgias/arthralgias, unexplained weight loss or weight gain No new focal weakness or sensory deficits No otalgia, hearing loss, visual changes, nasal and sinus symptoms, mouth and throat problems No neck pain or adenopathy No abdominal pain, N/V/D, diarrhea, change in bowel pattern No dysuria, change in urinary pattern   Vitals:   04/28/16 1130 04/28/16 1231 04/28/16 1318 04/28/16 2009  BP: 124/79 139/86  93/79  Pulse: 74 80  83  Resp: (!) 27 20    Temp:  98.4 F (36.9 C)    TempSrc:  Oral    SpO2: (!) 89% 96% 97% 97%  Weight:  179 lb 12.8 oz (81.6 kg)    Height:  6' (1.829 m)       EXAM:  Gen: WDWN, No overt respiratory distress HEENT: NCAT, sclera white, oropharynx normal Neck: Supple without LAN, thyromegaly, JVD Lungs:  breath sounds: full, percussion: normal, diffuse crackles, no wheezes Cardiovascular: RRR, no murmurs noted Abdomen: Soft, nontender, normal BS Ext: without clubbing, cyanosis, edema Neuro: CNs grossly intact, motor and sensory intact Skin: Limited exam, no lesions noted  DATA:   BMP Latest Ref Rng & Units 04/28/2016 03/25/2016 03/24/2016  Glucose 65 - 99 mg/dL 161(W116(H) 960(A120(H) 540(J110(H)  BUN 6 - 20 mg/dL 8 19 12   Creatinine 0.61 - 1.24 mg/dL 8.110.92 9.141.05 7.820.92  Sodium 135 - 145 mmol/L 134(L) 136 136  Potassium 3.5 - 5.1 mmol/L 4.1 4.0 4.1  Chloride 101 - 111 mmol/L 97(L) 104 103  CO2 22 - 32 mmol/L 29 25 26   Calcium 8.9 - 10.3 mg/dL 9.4 9.5(A8.7(L) 9.1    CBC Latest Ref Rng & Units 04/28/2016 03/25/2016 03/24/2016  WBC 3.8 - 10.6 K/uL 10.2  12.2(H) 9.1  Hemoglobin 13.0 - 18.0 g/dL 21.314.6 12.3(L) 13.8  Hematocrit 40.0 - 52.0 % 42.3 36.3(L) 40.6  Platelets 150 - 440 K/uL 216 180 185    CXR:  Diffuse, extensive interstitial prominence   IMPRESSION:   1) Severe IPF 2) acute on chronic hypoxic respiratory failure - typically, these episodes are one of three things or some combination - superimposed pulmonary edema, superimposed infection or acute exacerbation of IPF. It is very difficult to discern but in this case, there is little to support pulmonary edema and if infectious, it would likely be an atypical infection. He is most likely in an accelerated phase of IPF progression/acute exacerbation   PLAN:  1) continue empiric oxygen and nebulized bronchodilators 2) Empiric systemic steroids 3) Empiric antibiotics. Doxycycline causes nausea so will use levofloxacin X 7 days   Billy Fischeravid Nader Boys, MD PCCM service Mobile [phone] Pager [phone] 04/28/2016

## 2016-04-28 NOTE — ED Triage Notes (Signed)
C/O worsening SOB yesterday and today.  Patient has history of pulmonary fibrosis.  Wears 2L/[place] at home.

## 2016-04-29 ENCOUNTER — Inpatient Hospital Stay: Payer: PPO

## 2016-04-29 ENCOUNTER — Inpatient Hospital Stay (HOSPITAL_COMMUNITY)
Admit: 2016-04-29 | Discharge: 2016-04-29 | Disposition: A | Discharge status: Home / Self Care | Payer: PPO | Attending: Internal Medicine | Admitting: Internal Medicine

## 2016-04-29 DIAGNOSIS — R0600 Dyspnea, unspecified: Secondary | ICD-10-CM

## 2016-04-29 MED ORDER — CLONAZEPAM 0.5 MG PO TABS
0.5000 mg | ORAL_TABLET | Freq: Two times a day (BID) | ORAL | Status: DC | PRN
  Administered 2016-04-29: 0.5 mg via ORAL
  Filled 2016-04-29: qty 1

## 2016-04-29 MED ORDER — FUROSEMIDE 20 MG PO TABS
20.0000 mg | ORAL_TABLET | Freq: Every day | ORAL | Status: DC
  Administered 2016-04-30: 11:00:00 20 mg via ORAL
  Filled 2016-04-29: qty 1

## 2016-04-29 MED ORDER — ACETAMINOPHEN 325 MG PO TABS: 650.0000 mg | ORAL_TABLET | Freq: Four times a day (QID) | ORAL | Status: DC | PRN

## 2016-04-29 MED ORDER — CLONAZEPAM 0.5 MG PO TABS: 0.5000 mg | ORAL_TABLET | Freq: Two times a day (BID) | ORAL | Status: DC

## 2016-04-29 MED ORDER — BENZONATATE 100 MG PO CAPS
100.0000 mg | ORAL_CAPSULE | Freq: Four times a day (QID) | ORAL | Status: DC | PRN
  Administered 2016-04-29: 100 mg via ORAL
  Filled 2016-04-29: qty 1

## 2016-04-29 MED ORDER — METHYLPREDNISOLONE SODIUM SUCC 40 MG IJ SOLR
40.0000 mg | Freq: Two times a day (BID) | INTRAMUSCULAR | Status: DC
  Administered 2016-04-29 – 2016-04-30 (×2): 40 mg via INTRAVENOUS
  Filled 2016-04-29 (×2): qty 1

## 2016-04-29 MED ORDER — ENOXAPARIN SODIUM 40 MG/0.4ML ~~LOC~~ SOLN
40.0000 mg | SUBCUTANEOUS | Status: DC
  Administered 2016-04-29: 40 mg via SUBCUTANEOUS
  Filled 2016-04-29: qty 0.4

## 2016-04-29 NOTE — Consult Note (Signed)
   [facility]   04/29/2016  Brynda PeonFloyd G Weight Jr. [birth year] [id number]  Patient screened for potential [facility] Care Management services. Patient is eligible for [facility] Services. Spoke with inpatient case manager before talking with patient and was informed patient was not appropriate at this time related to other services he was involved in currently post discharge. If patient's post hospital needs change please place a [facility] consult. For questions please contact:   Anurag Scarfo RN, BSN [facility]  Hospital Liaison  [phone]) Business Mobile [phone]) Toll free office

## 2016-04-29 NOTE — Care Management (Signed)
Admitted to [facility] with the diagnosis of acute/chronic respiratory failure. Lives with wife, Aurther Lofterry.  [phone]). Last seen Dr. Alphonsus SiasLetvak in June 2017. No home Health. No skilled facility. Home oxygen through [facility] 2 liters per nasal cannula (continous) No falls. Good appetite. [facility]. Takes care of all activities of daily living himself, doesn't drive. Wife does errands and will transport. Gwenette GreetBrenda S Arlethia Basso RN MSN CCM Care Management [phone]

## 2016-04-29 NOTE — Care Management Important Message (Signed)
Important Message  Patient Details  Name: Bradley PeonFloyd G Hern Jr. MRN: [id number] Date of Birth: 1950-12-26   Medicare Important Message Given:  Yes    Gwenette GreetBrenda S Sherril Heyward, RN 04/29/2016, 8:00 AM

## 2016-04-29 NOTE — Progress Notes (Signed)
[facility] PROGRESS NOTE  Bradley May. ZOX:[id number] DOB: 21-Feb-1951 DOA: 04/28/2016 PCP: [name], MD  HPI/Subjective: Patient breathing a little bit better since coming in. He states he has not been doing well since the spring. He is going to be evaluated at [facility] for lung transplant.  Objective: Vitals:   04/29/16 1000 04/29/16 1402  BP: (!) 116/57 126/74  Pulse: 73 92  Resp: 18 18  Temp:  97.5 F (36.4 C)    Filed Weights   04/28/16 0916 04/28/16 1231  Weight: 83.9 kg (185 lb) 81.6 kg (179 lb 12.8 oz)    ROS: Review of Systems  Constitutional: Negative for chills and fever.  Eyes: Negative for blurred vision.  Respiratory: Positive for cough and shortness of breath.   Cardiovascular: Negative for chest pain.  Gastrointestinal: Negative for constipation, diarrhea, nausea and vomiting.  Genitourinary: Negative for dysuria.  Musculoskeletal: Negative for joint pain.  Neurological: Negative for dizziness and headaches.   Exam: Physical Exam  Constitutional: He is oriented to person, place, and time.  HENT:  Nose: No mucosal edema.  Mouth/Throat: No oropharyngeal exudate or posterior oropharyngeal edema.  Eyes: Conjunctivae, EOM and lids are normal. Pupils are equal, round, and reactive to light.  Neck: No JVD present. Carotid bruit is not present. No edema present. No thyroid mass and no thyromegaly present.  Cardiovascular: S1 normal and S2 normal.  Exam reveals no gallop.   No murmur heard. Pulses:      Dorsalis pedis pulses are 2+ on the right side, and 2+ on the left side.  Respiratory: No respiratory distress. He has no wheezes. He has rhonchi in the right lower field and the left lower field. He has no rales.  GI: Soft. Bowel sounds are normal. There is no tenderness.  Musculoskeletal:       Right ankle: He exhibits swelling.       Left ankle: He exhibits swelling.  Lymphadenopathy:    He has no cervical adenopathy.  Neurological: He is  alert and oriented to person, place, and time. No cranial nerve deficit.  Skin: Skin is warm. No rash noted. Nails show no clubbing.  Psychiatric: He has a normal mood and affect.      Data Reviewed: Basic Metabolic Panel:  Recent Labs Lab 04/28/16 0923  NA 134*  K 4.1  CL 97*  CO2 29  GLUCOSE 116*  BUN 8  CREATININE 0.92  CALCIUM 9.4   Liver Function Tests:  Recent Labs Lab 04/28/16 0923  AST 26  ALT 17  ALKPHOS 42  BILITOT 0.6  PROT 8.3*  ALBUMIN 3.8   CBC:  Recent Labs Lab 04/28/16 0923  WBC 10.2  NEUTROABS 8.2*  HGB 14.6  HCT 42.3  MCV 92.6  PLT 216   Cardiac Enzymes:  Recent Labs Lab 04/28/16 0923  TROPONINI <0.03   BNP (last 3 results)  Recent Labs  04/28/16 0923  BNP 180.0*      Recent Results (from the past 240 hour(s))  Blood culture (routine x 2)     Status: None (Preliminary result)   Collection Time: 04/28/16  9:43 AM  Result Value Ref Range Status   Specimen Description BLOOD RIGHT ANTECUBITAL  Final   Special Requests BOTTLES DRAWN AEROBIC AND ANAEROBIC  13CC  Final   Culture NO GROWTH < 24 HOURS  Final   Report Status PENDING  Incomplete  Blood culture (routine x 2)     Status: None (Preliminary result)   Collection  Time: 04/28/16  9:43 AM  Result Value Ref Range Status   Specimen Description BLOOD RIGHT ARM  Final   Special Requests BOTTLES DRAWN AEROBIC AND ANAEROBIC  13CC  Final   Culture NO GROWTH < 24 HOURS  Final   Report Status PENDING  Incomplete     Studies: Dg Chest Port 1 View  Result Date: 04/29/2016 CLINICAL DATA:  Respiratory failure. EXAM: PORTABLE CHEST 1 VIEW COMPARISON:  04/28/2016, 03/24/2016, 05/09/2015, 05/03/2014. FINDINGS: Stable cardiomegaly. Persistent bilateral pulmonary interstitial prominence consistent with interstitial edema and/or pneumonitis. Underlying chronic interstitial lung disease also present. No pleural effusion . No pneumothorax. IMPRESSION: 1. Cardiomegaly with persistent  bilateral pulmonary interstitial prominence most consistent with congestive heart failure. Interstitial pneumonitis cannot be excluded. No interim change. 2.  Chronic interstitial lung disease also again noted. Electronically Signed   By: Maisie Fushomas  Register   On: 04/29/2016 07:43   Dg Chest Portable 1 View  Result Date: 04/28/2016 CLINICAL DATA:  Worsening shortness breath yesterday and today. History of pulmonary fibrosis. EXAM: PORTABLE CHEST 1 VIEW COMPARISON:  Chest x-rays dated 03/24/2016 and 05/09/2015. FINDINGS: Cardiomegaly is stable. Overall cardiomediastinal silhouette appears stable in size and configuration. There is increased interstitial prominence, presumably edema, superimposed on chronic pulmonary fibrosis. No new confluent airspace opacity to suggest a developing pneumonia. No pleural effusion or pneumothorax seen. Osseous structures about the chest are unremarkable. IMPRESSION: 1. Acute interstitial edema, suggesting CHF/volume overload, superimposed on chronic fibrosis. 2. No evidence of pneumonia seen. Electronically Signed   By: Bary RichardStan  Maynard M.D.   On: 04/28/2016 09:42    Scheduled Meds: . aspirin EC  81 mg Oral Daily  . budesonide (PULMICORT) nebulizer solution  0.25 mg Nebulization Q6H  . calcium citrate-vitamin D  1 tablet Oral Daily  . enoxaparin (LOVENOX) injection  40 mg Subcutaneous Q24H  . famotidine  20 mg Oral Daily  . guaiFENesin  600 mg Oral BID  . ipratropium  0.5 mg Nebulization Q6H  . irbesartan  37.5 mg Oral Daily  . levalbuterol  1.25 mg Nebulization Q6H  . levofloxacin  500 mg Oral Daily  . methylPREDNISolone (SOLU-MEDROL) injection  80 mg Intravenous Q12H  . metoprolol succinate  25 mg Oral Daily  . Pirfenidone  3 capsule Oral TID WC  . rosuvastatin  40 mg Oral Daily  . sodium chloride flush  3 mL Intravenous Q12H    Assessment/Plan:  1. Acute on chronic respiratory failure. Continue usual rate of oxygen. 2. Interstitial lung disease exacerbation.  Continue nebulizer treatments with budesonide. Continue Solu-Medrol. Empiric Levaquin. 3. Possibility of congestive heart failure but I'm not quite sure. I will obtain an echocardiogram. Hold off on IV Lasix at this point. Oral Lasix tomorrow. 4. Essential hypertension continue usual she'll medications 5. Hyperlipidemia unspecified continue Crestor  Code Status:     Code Status Orders        Start     Ordered   04/28/16 1229  Do not attempt resuscitation (DNR)  Continuous    Question Answer Comment  In the event of cardiac or respiratory ARREST Do not call a "code blue"   In the event of cardiac or respiratory ARREST Do not perform Intubation, CPR, defibrillation or ACLS   In the event of cardiac or respiratory ARREST Use medication by any route, position, wound care, and other measures to relive pain and suffering. May use oxygen, suction and manual treatment of airway obstruction as needed for comfort.      04/28/16 1228  Code Status History    Date Active Date Inactive Code Status Order ID Comments User Context   03/24/2016  1:06 PM 03/26/2016  4:11 PM Full Code [id number]  Katharina Caper, MD Inpatient     Family Communication: Wife at the bedside Disposition Plan: To be determined  Consultants:  Pulmonary  Antibiotics:  Levaquin  Time spent: 28 minutes  Alford Highland  [employer]

## 2016-04-29 NOTE — Progress Notes (Signed)
Dr. Renae GlossWieting notified patient feels very anxious, flushed, jittery, "like his skin is crawling".   BP 134/77 HR 81 O2 saturations 88% on 2L.  See new orders. Will continue to closely monitor.

## 2016-04-29 NOTE — Evaluation (Signed)
Clinical/Bedside Swallow Evaluation Patient Details  Name: Bradley May. MRN: [id number] Date of Birth: Nov 11, 1950  Today's Date: 04/29/2016 Time: SLP Start Time (ACUTE ONLY): 0950 SLP Stop Time (ACUTE ONLY): 1050 SLP Time Calculation (min) (ACUTE ONLY): 60 min  Past Medical History:  Past Medical History:  Diagnosis Date  . Allergic rhinitis   . BPH (benign prostatic hypertrophy)    Dr. [name]  . CAD (coronary artery disease)    Dr. [name]  . Idiopathic pulmonary fibrosis (HCC)   . Interstitial lung disease (HCC)    Dr. [name]   Past Surgical History:  Past Surgical History:  Procedure Laterality Date  . ANGIOPLASTY  1992  . CORONARY ANGIOPLASTY WITH STENT PLACEMENT  01/2001   RCA  . ESOPHAGOGASTRODUODENOSCOPY  01/2001   bleeding  . KNEE SURGERY  1978   Right  . URETERAL STENT PLACEMENT  01/2009   Dr. [name]   HPI:  Pt is a 65 y/o male w/ history of idiopathic pulmonary fibrosis, CAD, on chronic O2 support at home. Was on a steroid taper till about two weeks ago. Since then has developed worsening shortness of breath and orthopneic symptoms. Past two days has become worse. Per MD note, pt has been referred to [facility] for transplant evaluation which is scheduled for next month. Pt reports he has to sleep sitting up in a chair and that he often has coughing spells. The coughing that he does is not necessary related to eating meals or drinking although it can happen during a meal "at times". Pt stated he usually drinks a sip of water to "stop the coughing". He denies any dysphagia w/ foods/liquids - does not eat red meat, pork.    Assessment / Plan / Recommendation Clinical Impression  Pt appears at reduced risk for aspiration w/ oral intake following general aspiration precautions but at minimal increased risk overall w/ oral intake d/t declined Pulmonary status. Pt c/o increased coughing w/ any exertion and oral intake is exerting. Pt was able to consume trials of thin liquids (and  earlier breakfast meal) w/ no immediate, overt s/s of aspiration, clear vocal quality noted b/t trials. Pt indicated that he does have increased coughing w/ exertion, including meals, but that he does not attribute this to dysphagia. He stated he usually swallows a small amount of water to "help stop the coughing". Thoroughly discussed the relationship b/t swallowing and breathing; challenges to both; strategies to lessen exertion of mastication and oral intake overall to aid swallowing; food/consistencies that area easier to eat/drink and lessen the demand of work thus reducing the risk for aspiration.  Pt and wife endorsed general aspiration precautions and strategies during meals. Cautioned pt on using any mouth drops or mints when feeling SOB w/ the dry mouth he experiences. He is aware of products for dry mouth. Recommended a more mech soft-type diet moistened well w/ condiments/gravy/soups. Recommended pt monitor his Pulmonary status at meals and take rest breaks when necessary. Recommended pills be swallowed w/ a puree if easier(NSG and pt denied any difficulty swallowing pills w/ water this morning.) Pt and wife agreed w/ all information above. NSG to reconsult if any decline in status while admitted.     Aspiration Risk   (reduced following general aspiration precautions)    Diet Recommendation  mech soft-type diet w/ thin liquids; moistened well; general aspiration and Reflux precautions  Medication Administration: Whole meds with puree (for easier swallowing if needed)    Other  Recommendations Recommended Consults:  (Dietician  consult for potential nutritional supplement) Oral Care Recommendations: Oral care BID;Patient independent with oral care   Follow up Recommendations  None    Frequency and Duration            Prognosis Prognosis for Safe Diet Advancement: Good Barriers to Reach Goals:  (pulmonary status baseline)      Swallow Study   General Date of Onset: 04/28/16 HPI:  Pt is a 65 y/o male w/ history of idiopathic pulmonary fibrosis, CAD, on chronic O2 support at home. Was on a steroid taper till about two weeks ago. Since then has developed worsening shortness of breath and orthopneic symptoms. Past two days has become worse. Per MD note, pt [facility] which is scheduled for next month. Pt reports he has to sleep sitting up in a chair and that he often has coughing spells. The coughing that he does is not necessary related to eating meals or drinking although it can happen during a meal "at times". Pt stated he usually drinks a sip of water to "stop the coughing". He denies any dysphagia w/ foods/liquids - does not eat red meat, pork.  Type of Study: Bedside Swallow Evaluation Previous Swallow Assessment: none Diet Prior to this Study: Regular;Thin liquids Temperature Spikes Noted: No (wbc not elevated) Respiratory Status: Nasal cannula (2 liters) History of Recent Intubation: No Behavior/Cognition: Alert;Cooperative;Pleasant mood Oral Cavity Assessment: Within Functional Limits;Dry Oral Care Completed by SLP: Recent completion by staff Oral Cavity - Dentition: Adequate natural dentition Vision: Functional for self-feeding Self-Feeding Abilities: Able to feed self Patient Positioning: Upright in bed Baseline Vocal Quality: Normal (often reduced breath support for lengthy sentences ) Volitional Cough: Strong Volitional Swallow: Able to elicit    Oral/Motor/Sensory Function Overall Oral Motor/Sensory Function: Within functional limits   Ice Chips Ice chips: Not tested   Thin Liquid Thin Liquid: Within functional limits Presentation: Straw;Self Fed (6-7 trials) Other Comments: he responded to his baseline (dry) cough by drinking a sip of water    Nectar Thick Nectar Thick Liquid: Not tested   Honey Thick Honey Thick Liquid: Not tested   Puree Puree: Not tested   Solid   GO   Solid: Within functional limits Other  Comments: consumed trials of breakfast meal adequately w/out overt s/s of aspiration       Jerilynn SomKatherine Watson, MS, CCC-SLP  Watson,Katherine 04/29/2016,2:48 PM

## 2016-04-30 DIAGNOSIS — J8410 Pulmonary fibrosis, unspecified: Secondary | ICD-10-CM

## 2016-04-30 DIAGNOSIS — J9620 Acute and chronic respiratory failure, unspecified whether with hypoxia or hypercapnia: Secondary | ICD-10-CM

## 2016-04-30 LAB — BASIC METABOLIC PANEL
Anion gap: 7 (ref 5–15)
BUN: 17 mg/dL (ref 6–20)
CALCIUM: 9.4 mg/dL (ref 8.9–10.3)
CO2: 28 mmol/L (ref 22–32)
Chloride: 94 mmol/L — ABNORMAL LOW (ref 101–111)
Creatinine, Ser: 0.82 mg/dL (ref 0.61–1.24)
GLUCOSE: 118 mg/dL — AB (ref 65–99)
Potassium: 5 mmol/L (ref 3.5–5.1)
SODIUM: 129 mmol/L — AB (ref 135–145)

## 2016-04-30 LAB — ECHOCARDIOGRAM COMPLETE
HEIGHTINCHES: 72 in
Weight: 2876.8 oz

## 2016-04-30 MED ORDER — LEVALBUTEROL HCL 1.25 MG/0.5ML IN NEBU
1.2500 mg | INHALATION_SOLUTION | Freq: Four times a day (QID) | RESPIRATORY_TRACT | 0 refills | Status: DC
Start: 2016-04-30 — End: 2016-05-09

## 2016-04-30 MED ORDER — NYSTATIN 100000 UNIT/ML MT SUSP
5.0000 mL | Freq: Four times a day (QID) | OROMUCOSAL | Status: DC
  Administered 2016-04-30: 500000 [IU] via ORAL
  Filled 2016-04-30: qty 5

## 2016-04-30 MED ORDER — PREDNISONE 20 MG PO TABS
40.0000 mg | ORAL_TABLET | Freq: Every day | ORAL | Status: DC
  Administered 2016-04-30: 40 mg via ORAL
  Filled 2016-04-30: qty 2

## 2016-04-30 MED ORDER — MOMETASONE FURO-FORMOTEROL FUM 200-5 MCG/ACT IN AERO
2.0000 | INHALATION_SPRAY | Freq: Two times a day (BID) | RESPIRATORY_TRACT | Status: DC
  Filled 2016-04-30: qty 8.8

## 2016-04-30 MED ORDER — FLUTICASONE-SALMETEROL 115-21 MCG/ACT IN AERO: INHALATION_SPRAY | RESPIRATORY_TRACT | 12 refills | Status: DC

## 2016-04-30 MED ORDER — TIOTROPIUM BROMIDE MONOHYDRATE 1.25 MCG/ACT IN AERS: 1.0000 | INHALATION_SPRAY | Freq: Every morning | RESPIRATORY_TRACT | 0 refills | Status: DC

## 2016-04-30 MED ORDER — PREDNISONE 5 MG PO TABS: ORAL_TABLET | ORAL | 0 refills | Status: AC

## 2016-04-30 MED ORDER — CLONAZEPAM 0.5 MG PO TABS: 0.5000 mg | ORAL_TABLET | Freq: Two times a day (BID) | ORAL | 0 refills | Status: DC | PRN

## 2016-04-30 MED ORDER — IPRATROPIUM BROMIDE 0.02 % IN SOLN: 0.5000 mg | Freq: Four times a day (QID) | RESPIRATORY_TRACT | Status: DC | PRN

## 2016-04-30 MED ORDER — LEVALBUTEROL HCL 1.25 MG/0.5ML IN NEBU: 1.2500 mg | INHALATION_SOLUTION | Freq: Four times a day (QID) | RESPIRATORY_TRACT | Status: DC

## 2016-04-30 MED ORDER — LEVOFLOXACIN 500 MG PO TABS: 500.0000 mg | ORAL_TABLET | Freq: Every day | ORAL | 0 refills | Status: DC

## 2016-04-30 MED ORDER — LEVALBUTEROL HCL 1.25 MG/0.5ML IN NEBU: 1.2500 mg | INHALATION_SOLUTION | Freq: Four times a day (QID) | RESPIRATORY_TRACT | Status: DC | PRN

## 2016-04-30 MED ORDER — IPRATROPIUM BROMIDE 0.02 % IN SOLN
0.5000 mg | Freq: Four times a day (QID) | RESPIRATORY_TRACT | Status: DC
  Filled 2016-04-30: qty 2.5

## 2016-04-30 MED ORDER — NYSTATIN 100000 UNIT/ML MT SUSP: 5.0000 mL | Freq: Four times a day (QID) | OROMUCOSAL | 0 refills | Status: DC

## 2016-04-30 MED ORDER — LEVALBUTEROL HCL 1.25 MG/0.5ML IN NEBU
1.2500 mg | INHALATION_SOLUTION | Freq: Four times a day (QID) | RESPIRATORY_TRACT | Status: DC
  Filled 2016-04-30: qty 0.5

## 2016-04-30 MED ORDER — ALBUTEROL SULFATE HFA 108 (90 BASE) MCG/ACT IN AERS: 2.0000 | INHALATION_SPRAY | Freq: Four times a day (QID) | RESPIRATORY_TRACT | 0 refills | Status: AC | PRN

## 2016-04-30 MED ORDER — BUDESONIDE 0.25 MG/2ML IN SUSP: 0.2500 mg | Freq: Two times a day (BID) | RESPIRATORY_TRACT | Status: DC | PRN

## 2016-04-30 NOTE — Care Management (Signed)
Discharge to home today per Dr. Renae GlossWieting. Nebulizer ordered for the home. Feliberto GottronJason Hinton, [facility] representative updated. Wife will transport. Gwenette GreetBrenda S Sacha Radloff RN MSN CCM Care Management [phone]

## 2016-04-30 NOTE — Discharge Summary (Signed)
[facility] [place] [facility]   PATIENT NAME: Bradley May    MR#:  [id number]  DATE OF BIRTH:  01-11-1951  DATE OF ADMISSION:  04/28/2016 ADMITTING PHYSICIAN: Gracelyn NurseJohn D Johnston, MD  DATE OF DISCHARGE: 04/30/2016  2:10 PM  PRIMARY CARE PHYSICIAN: Tillman Abideichard Letvak, MD    ADMISSION DIAGNOSIS:  SOB (shortness of breath) [R06.02] Interstitial edema [R60.9] Idiopathic pulmonary fibrosis (HCC) [J84.112]  DISCHARGE DIAGNOSIS:  Active Problems:   Acute on chronic respiratory failure (HCC)   SECONDARY DIAGNOSIS:   Past Medical History:  Diagnosis Date  . Allergic rhinitis   . BPH (benign prostatic hypertrophy)    Dr. Achilles Dunkope  . CAD (coronary artery disease)    Dr. Daleen SquibbWall  . Idiopathic pulmonary fibrosis (HCC)   . Interstitial lung disease (HCC)    Dr. Shelle Ironlance    HOSPITAL COURSE:   1. Acute on chronic respiratory failure. Continue usual rate of oxygen at this point. Patient was really working to breathe when he came into the hospital. 2. Interstitial lung disease exacerbation. During the hospital he was given nebulizer treatments and budesonide nebulizers. He was given Solu-Medrol and empiric Levaquin. I'll finish a seven-day course of Levaquin. I will give a prednisone taper upon going home. I ordered a nebulizer and nebulizer treatments. Advair and Spiriva ordered. Patient is [facility]. Patient states that he's been very limited with his activity since July 4. 3. I do not think this is congestive heart failure. Echocardiogram showed a normal EF. Discontinue Lasix. 4. Essential hypertension and continue usual medications 5. Hyperlipidemia unspecified on Crestor 6. Thrush nystatin swish and swallow ordered 7. Anxiety. When necessary Klonopin   DISCHARGE CONDITIONS:   Satisfactory  CONSULTS OBTAINED:  Treatment Team:  Merwyn Katosavid B Simonds, MD  DRUG ALLERGIES:   Allergies  Allergen Reactions  . Atorvastatin      REACTION: Headache  . Doxycycline     REACTION: Nausea    DISCHARGE MEDICATIONS:   Discharge Medication List as of 04/30/2016 11:48 AM    START taking these medications   Details  albuterol (PROVENTIL HFA;VENTOLIN HFA) 108 (90 Base) MCG/ACT inhaler Inhale 2 puffs into the lungs every 6 (six) hours as needed for wheezing or shortness of breath., Starting Tue 04/30/2016, Print    clonazePAM (KLONOPIN) 0.5 MG tablet Take 1 tablet (0.5 mg total) by mouth 2 (two) times daily as needed (anxiety)., Starting Tue 04/30/2016, Print    fluticasone-salmeterol (ADVAIR HFA) 115-21 MCG/ACT inhaler Rinse mouth with water after using, Print    levalbuterol (XOPENEX) 1.25 MG/0.5ML nebulizer solution Take 1.25 mg by nebulization every 6 (six) hours., Starting Tue 04/30/2016, Print    nystatin (MYCOSTATIN) 100000 UNIT/ML suspension Take 5 mLs (500,000 Units total) by mouth 4 (four) times daily., Starting Tue 04/30/2016, Print    predniSONE (DELTASONE) 5 MG tablet 6 tabs po day1; 5 tabs po day2; 4 tabs po day3; 3 tabs po day4; 2 tabs po day5,6; 1 tab day7,8, Print    Tiotropium Bromide Monohydrate (SPIRIVA RESPIMAT) 1.25 MCG/ACT AERS Take 1 Inhaler by mouth every morning., Starting Tue 04/30/2016, Normal      CONTINUE these medications which have CHANGED   Details  levofloxacin (LEVAQUIN) 500 MG tablet Take 1 tablet (500 mg total) by mouth daily., Starting Tue 04/30/2016, Print      CONTINUE these medications which have NOT CHANGED   Details  aspirin EC 81 MG tablet Take 81 mg by mouth daily., Until Discontinued, Historical  Med    CRESTOR 40 MG tablet TAKE 1 TABLET EVERY DAY, Normal    fluticasone (FLONASE) 50 MCG/ACT nasal spray USE TWO PUFFS INTO EACH NOSTRIL DAILY, Normal    guaiFENesin (MUCINEX) 600 MG 12 hr tablet Take 1 tablet (600 mg total) by mouth 2 (two) times daily., Starting Tue 03/26/2016, Print    metoprolol succinate (TOPROL-XL) 25 MG 24 hr tablet TAKE 1 TABLET EVERY DAY, Normal    nitroGLYCERIN  (NITROSTAT) 0.4 MG SL tablet Place 1 tablet (0.4 mg total) under the tongue every 5 (five) minutes as needed. For chest pain. May repeat 3 times, Starting 02/28/2016, Until Discontinued, Normal    OXYGEN Inhale 2 L/min into the lungs continuous. 2lpm 24/7 [facility] , Historical Med    Pirfenidone (ESBRIET) 267 MG CAPS Take 3 capsules by mouth 3 (three) times daily., Starting 05/24/2014, Until Discontinued, Phone In    silodosin (RAPAFLO) 8 MG CAPS capsule Take 8 mg by mouth daily as needed., Starting 04/07/2015, Until Discontinued, Historical Med    traMADol (ULTRAM) 50 MG tablet TAKE ONE TABLET EVERY SIX HOURS AS NEEDED FOR COUGH, Print    valsartan (DIOVAN) 80 MG tablet TAKE 1 TABLET EVERY DAY, Normal      STOP taking these medications     Dextromethorphan-Guaifenesin (DELSYM COUGH/CHEST CONGEST DM PO)      famotidine (PEPCID) 20 MG tablet      Calcium Citrate-Vitamin D (CITRACAL MAXIMUM) 315-250 MG-UNIT TABS      famotidine (PEPCID) 10 MG tablet          DISCHARGE INSTRUCTIONS:   Follow-up PMD one week Follow-up with Dr. Belia Heman as outpatient  If you experience worsening of your admission symptoms, develop shortness of breath, life threatening emergency, suicidal or homicidal thoughts you must seek medical attention immediately by calling 911 or calling your MD immediately  if symptoms less severe.  You Must read complete instructions/literature along with all the possible adverse reactions/side effects for all the Medicines you take and that have been prescribed to you. Take any new Medicines after you have completely understood and accept all the possible adverse reactions/side effects.   Please note  You were cared for by a hospitalist during your hospital stay. If you have any questions about your discharge medications or the care you received while you were in the hospital after you are discharged, you can call the unit and asked to speak with the hospitalist on call if the  hospitalist that took care of you is not available. Once you are discharged, your primary care physician will handle any further medical issues. Please note that NO REFILLS for any discharge medications will be authorized once you are discharged, as it is imperative that you return to your primary care physician (or establish a relationship with a primary care physician if you do not have one) for your aftercare needs so that they can reassess your need for medications and monitor your lab values.    Today   CHIEF COMPLAINT:   Chief Complaint  Patient presents with  . Shortness of Breath    HISTORY OF PRESENT ILLNESS:  Bradley May  is a 65 y.o. male with a known history of Interstitial lung disease presents with shortness of breath   VITAL SIGNS:  Blood pressure (!) 144/85, pulse 99, temperature 97.9 F (36.6 C), temperature source Oral, resp. rate 20, height 6' (1.829 m), weight 81.6 kg (179 lb 12.8 oz), SpO2 91 %.    PHYSICAL EXAMINATION:  GENERAL:  65 y.o.-year-old patient lying in the bed with no acute distress.  EYES: Pupils equal, round, reactive to light and accommodation. No scleral icterus. Extraocular muscles intact.  HEENT: Head atraumatic, normocephalic. Oropharynx and nasopharynx clear.  NECK:  Supple, no jugular venous distention. No thyroid enlargement, no tenderness.  LUNGS: Decreased breath sounds bilaterally, no wheezing, rales,rhonchi or crepitation. No use of accessory muscles of respiration.  CARDIOVASCULAR: S1, S2 normal. No murmurs, rubs, or gallops.  ABDOMEN: Soft, non-tender, non-distended. Bowel sounds present. No organomegaly or mass.  EXTREMITIES: No pedal edema, cyanosis, or clubbing.  NEUROLOGIC: Cranial nerves II through XII are intact. Muscle strength 5/5 in all extremities. Sensation intact. Gait not checked.  PSYCHIATRIC: The patient is alert and oriented x 3.  SKIN: No obvious rash, lesion, or ulcer.   DATA REVIEW:   CBC  Recent Labs Lab  04/28/16 0923  WBC 10.2  HGB 14.6  HCT 42.3  PLT 216    Chemistries   Recent Labs Lab 04/28/16 0923 04/30/16 0452  NA 134* 129*  K 4.1 5.0  CL 97* 94*  CO2 29 28  GLUCOSE 116* 118*  BUN 8 17  CREATININE 0.92 0.82  CALCIUM 9.4 9.4  AST 26  --   ALT 17  --   ALKPHOS 42  --   BILITOT 0.6  --     Cardiac Enzymes  Recent Labs Lab 04/28/16 0923  TROPONINI <0.03    Microbiology Results  Results for orders placed or performed during the hospital encounter of 04/28/16  Blood culture (routine x 2)     Status: None (Preliminary result)   Collection Time: 04/28/16  9:43 AM  Result Value Ref Range Status   Specimen Description BLOOD RIGHT ANTECUBITAL  Final   Special Requests BOTTLES DRAWN AEROBIC AND ANAEROBIC  13CC  Final   Culture NO GROWTH < 24 HOURS  Final   Report Status PENDING  Incomplete  Blood culture (routine x 2)     Status: None (Preliminary result)   Collection Time: 04/28/16  9:43 AM  Result Value Ref Range Status   Specimen Description BLOOD RIGHT ARM  Final   Special Requests BOTTLES DRAWN AEROBIC AND ANAEROBIC  13CC  Final   Culture NO GROWTH < 24 HOURS  Final   Report Status PENDING  Incomplete    RADIOLOGY:  Dg Chest Port 1 View  Result Date: 04/29/2016 CLINICAL DATA:  Respiratory failure. EXAM: PORTABLE CHEST 1 VIEW COMPARISON:  04/28/2016, 03/24/2016, 05/09/2015, 05/03/2014. FINDINGS: Stable cardiomegaly. Persistent bilateral pulmonary interstitial prominence consistent with interstitial edema and/or pneumonitis. Underlying chronic interstitial lung disease also present. No pleural effusion . No pneumothorax. IMPRESSION: 1. Cardiomegaly with persistent bilateral pulmonary interstitial prominence most consistent with congestive heart failure. Interstitial pneumonitis cannot be excluded. No interim change. 2.  Chronic interstitial lung disease also again noted. Electronically Signed   By: Maisie Fus  Register   On: 04/29/2016 07:43    Management plans  discussed with the patient, family and they are in agreement.  CODE STATUS:     Code Status Orders        Start     Ordered   04/28/16 1229  Do not attempt resuscitation (DNR)  Continuous    Question Answer Comment  In the event of cardiac or respiratory ARREST Do not call a "code blue"   In the event of cardiac or respiratory ARREST Do not perform Intubation, CPR, defibrillation or ACLS   In the event of cardiac or respiratory ARREST Use medication  by any route, position, wound care, and other measures to relive pain and suffering. May use oxygen, suction and manual treatment of airway obstruction as needed for comfort.      04/28/16 1228    Code Status History    Date Active Date Inactive Code Status Order ID Comments User Context   04/28/2016 12:28 PM 04/29/2016  5:18 PM DNR [id number]  Gracelyn Nurse, MD Inpatient   03/24/2016  1:06 PM 03/26/2016  4:11 PM Full Code [id number]  Katharina Caper, MD Inpatient    Questions for Most Recent Historical Code Status (Order [id number])    Question Answer Comment   In the event of cardiac or respiratory ARREST Do not call a "code blue"    In the event of cardiac or respiratory ARREST Do not perform Intubation, CPR, defibrillation or ACLS    In the event of cardiac or respiratory ARREST Use medication by any route, position, wound care, and other measures to relive pain and suffering. May use oxygen, suction and manual treatment of airway obstruction as needed for comfort.       TOTAL TIME TAKING CARE OF THIS PATIENT: 35 minutes.    Alford Highland M.D on 04/30/2016 at 5:11 PM  Between 7am to 6pm - Pager - [phone]  After 6pm go to [email or web address] - password [employer]  [facility] Office  [phone]  CC: Primary care physician; [name], MD

## 2016-04-30 NOTE — Progress Notes (Signed)
Patient's home medication Pirfenidone returned back to patient.   Discharge information reviewed with patient who verbalized understanding. Prescriptions attached to discharge paperwork. Nubulizer provided by case Production designer, theatre/television/filmmanager. Patient's wife to transport home with home oxygen.

## 2016-04-30 NOTE — Consult Note (Signed)
PULMONARY CONSULT NOTE  Requesting MD/Service: Johnston/[facility] Date of initial consultation: 08/06 Reason for consultation: Acute on chronic hypoxic respiratory failure, IPF   CC: SOB HPI:  [age] M with history of IPF first diagnosed 2009 with chronic hypoxic respiratory failure, followed by Dr Sherene SiresWert and more recently seen by Dr Dema SeverinMungal. [facility] which is scheduled for next month. He was hospitalized 07/02 - 07/04 with worsening respiratory failure. He as discharged on a brief prednisone taper. He never fully recovered back to baseline. He is now admitted for increasing dyspnea and hypoxemia (he owns a pulse oximeter). He denies CP, fever, purulent sputum, hemoptysis, LE edema and calf tenderness. He has chronic nonproductive cough and describes a sensation of burning in his chest that is distinctly different than his previous anginal pain.  Subjective: remains SOB, but improved since admission   ROS: No fever, myalgias/arthralgias, unexplained weight loss or weight gain No new focal weakness or sensory deficits No otalgia, hearing loss, visual changes, nasal and sinus symptoms, mouth and throat problems No neck pain or adenopathy No abdominal pain, N/V/D, diarrhea, change in bowel pattern No dysuria, change in urinary pattern All other ROS negative  Vitals:   04/29/16 1402 04/29/16 1625 04/29/16 2118 04/30/16 0447  BP: 126/74 134/77 130/74 130/73  Pulse: 92 81 83 75  Resp: 18 20  17   Temp: 97.5 F (36.4 C) 98.8 F (37.1 C) 98 F (36.7 C) 97.9 F (36.6 C)  TempSrc: Oral Oral Oral Oral  SpO2: 96% (!) 88% 91% 93%  Weight:      Height:         EXAM:  Gen: WDWN, No overt respiratory distress HEENT: NCAT, sclera white, oropharynx normal Neck: Supple without LAN, thyromegaly, JVD Lungs: breath sounds: full, percussion: normal, diffuse crackles, no wheezes Cardiovascular: RRR, no murmurs noted Abdomen: Soft, nontender, normal BS Ext:  without clubbing, cyanosis, edema Neuro: CNs grossly intact, motor and sensory intact Skin: Limited exam, no lesions noted  DATA:   BMP Latest Ref Rng & Units 04/30/2016 04/28/2016 03/25/2016  Glucose 65 - 99 mg/dL 161(W118(H) 960(A116(H) 540(J120(H)  BUN 6 - 20 mg/dL 17 8 19   Creatinine 0.61 - 1.24 mg/dL 8.110.82 9.140.92 7.821.05  Sodium 135 - 145 mmol/L 129(L) 134(L) 136  Potassium 3.5 - 5.1 mmol/L 5.0 4.1 4.0  Chloride 101 - 111 mmol/L 94(L) 97(L) 104  CO2 22 - 32 mmol/L 28 29 25   Calcium 8.9 - 10.3 mg/dL 9.4 9.4 9.5(A8.7(L)    CBC Latest Ref Rng & Units 04/28/2016 03/25/2016 03/24/2016  WBC 3.8 - 10.6 K/uL 10.2 12.2(H) 9.1  Hemoglobin 13.0 - 18.0 g/dL 21.314.6 12.3(L) 13.8  Hematocrit 40.0 - 52.0 % 42.3 36.3(L) 40.6  Platelets 150 - 440 K/uL 216 180 185    CXR:  Diffuse, extensive interstitial prominence   IMPRESSION:   1) Severe IPF 2) acute on chronic hypoxic respiratory failure - typically, these episodes are one of three things or some combination - superimposed pulmonary edema, superimposed infection or acute exacerbation of IPF. It is very difficult to discern but in this case, there is little to support pulmonary edema and if infectious, it would likely be an atypical infection. He is most likely in an accelerated phase of IPF progression/acute exacerbation   PLAN:  1) continue empiric oxygen and nebulized bronchodilators( scheduled every 6 hrs) 2) continue Empiric systemic steroids 10 days total should be fine 3) continue Empiric antibiotics.  levofloxacin X 7 days  The Patient requires high complexity decision making for assessment and support, frequent evaluation and titration of therapies.   Patient satisfied with Plan of action and management. All questions answered  Lucie Leather, M.D.  [facility]  Medical Director [facility] Medical Director [facility] Cardio-Pulmonary Department

## 2016-05-01 ENCOUNTER — Telehealth: Payer: Self-pay

## 2016-05-01 NOTE — Telephone Encounter (Signed)
1st attempt to reach pt for TCM. Per pt's spouse, pt will f/u with Dr. Belia HemanKasa for post-hospital f/u. PCP notified.

## 2016-05-03 LAB — CULTURE, BLOOD (ROUTINE X 2)
CULTURE: NO GROWTH
CULTURE: NO GROWTH

## 2016-05-06 ENCOUNTER — Ambulatory Visit: Payer: 59 | Admitting: Internal Medicine

## 2016-05-09 ENCOUNTER — Encounter: Payer: Self-pay | Admitting: Internal Medicine

## 2016-05-09 ENCOUNTER — Ambulatory Visit (INDEPENDENT_AMBULATORY_CARE_PROVIDER_SITE_OTHER): Payer: PPO | Admitting: Internal Medicine

## 2016-05-09 VITALS — BP 108/60 | HR 89 | Wt 179.0 lb

## 2016-05-09 DIAGNOSIS — J9611 Chronic respiratory failure with hypoxia: Secondary | ICD-10-CM | POA: Diagnosis not present

## 2016-05-09 MED ORDER — BUDESONIDE 0.25 MG/2ML IN SUSP: 0.2500 mg | Freq: Two times a day (BID) | RESPIRATORY_TRACT | 4 refills | Status: AC

## 2016-05-09 MED ORDER — GUAIFENESIN-CODEINE 100-10 MG/5ML PO SYRP: 5.0000 mL | ORAL_SOLUTION | Freq: Three times a day (TID) | ORAL | 0 refills | Status: AC | PRN

## 2016-05-09 MED ORDER — PREDNISONE 20 MG PO TABS: 20.0000 mg | ORAL_TABLET | Freq: Every day | ORAL | 5 refills | Status: AC

## 2016-05-09 MED ORDER — LEVALBUTEROL HCL 1.25 MG/0.5ML IN NEBU: 1.2500 mg | INHALATION_SOLUTION | Freq: Four times a day (QID) | RESPIRATORY_TRACT | 4 refills | Status: AC

## 2016-05-09 NOTE — Patient Instructions (Signed)
1.continue xopenix 2.start pulmicort nebs 3.prednsione 20 mg daily 4.guafenison with codeine as needed 5.keep appointment with Lung transplant clinic  Chronic Respiratory Failure Respiratory failure is when your lungs are not working well and your breathing (respiratory) system fails. When respiratory failure occurs, it is difficult for your lungs to get enough oxygen or get rid of carbon dioxide or both. Respiratory failure can be life threatening.  Respiratory failure can be acute or chronic. Acute respiratory failure is sudden, severe, and requires emergency medical treatment. Chronic respiratory failure is less severe, happens over time, and requires ongoing treatment.  CAUSES  Any problem affecting the heart or lungs can cause respiratory failure. Some of these causes may be:  Chronic bronchitis and emphysema (COPD).  Blood clot going to the lung (pulmonary embolism).  Having water in the lungs caused by heart failure, lung injury, or infection (pulmonary edema).  Collapsed lung (pneumothorax).  Pneumonia.  Pulmonary fibrosis.  Obesity.  Asthma.  Heart failure.  Any type of trauma to the chest that can make breathing difficult.  Nerve or muscle diseases making chest movements difficult. SYMPTOMS  Signs and symptoms of chronic respiratory failure include:  Shortness of breath (dyspnea) with or without activity.  Rapid, fast breathing (tachypnea).  Wheezing.  Fast heart rate.  Bluish color to the fingernail or toenail beds.  Confusion or drowsiness or both. DIAGNOSIS  Initial diagnosis requires a thorough history and a physical exam by your health care provider. Additional tests may include:  Chest X-ray.  CT scan of your lungs.  Ultrasound to check for blood clots.  Blood tests, such as an arterial blood gas test (ABG). This is a blood test that looks at the oxygen and carbon dioxide levels in your arterial blood.  Your vital signs will be taken. This  includes your respiratory rate (how many times a minute you are breathing), oxygen saturation (this measures the oxygen level in your blood), heart rate, and blood pressure. These numbers help your health care provider determine the next steps.  Electrocardiogram. TREATMENT  Treatment of chronic respiratory failure depends on the cause of the respiratory failure. Treatment can include the following:  Oxygen. Oxygen can be delivered through the following:  Nasal cannula. This is small tubing that goes in your nose to give you oxygen.  Face mask. A face mask covers your nose and mouth to give you oxygen.  Medicine. Different medicines can be given to help with breathing. These can include:  Nebulizers. Nebulizers deliver medicines to open the air passages (bronchodilators). These medicines help to open or relax the airways in the lungs so you can breathe better. They can also help loosen mucus from your lungs.  Diuretics. Diuretic medicines can help you breathe better by getting rid of extra fluid in your body.  Steroids. Steroid medicines can help decrease inflammation in your lungs.  Chest tube. If you have a collapsed lung (pneumothorax), a chest tube is placed to help reinflate the lung.  Noninvasive positive pressure ventilation (NPPV). This is a tight-fitting mask that goes over your nose and mouth. The mask has tubing that is attached to a machine. The machine blows air into the tubing, which helps to keep the tiny air sacs (alveoli) in your lungs open. This machine allows you to breathe on your own.  Ventilator. A ventilator is a breathing machine. When on a ventilator, a breathing tube is put into the lungs. A ventilator is used when you can no longer breathe well enough on your  own. You may have low oxygen levels or high carbon dioxide (CO2) levels in your blood. When you are on a ventilator, sedation and pain medicines are given to make you sleep so your lungs can heal. HOME CARE  INSTRUCTIONS  Follow your health care provider's directions about medicines and respiratory therapy.  Quit smoking if you smoke. SEEK MEDICAL CARE IF:  You have increasing shortness of breath and are less functional than you have been.  You have increased sputum, wheezing, coughing, or loss of energy.  You are on oxygen and are requiring more. SEEK IMMEDIATE MEDICAL CARE IF:  Your shortness of breath is significantly worse.  You are unable to say more than a few words without having to catch your breath.  You are much less functional. MAKE SURE YOU:  Understand these instructions.  Will watch your condition.  Will get help right away if you are not doing well or get worse.   This information is not intended to replace advice given to you by your health care provider. Make sure you discuss any questions you have with your health care provider.   Document Released: 09/09/2005 Document Revised: 05/31/2015 Document Reviewed: 07/08/2013 Elsevier Interactive Patient Education [employer]2016 [employer] Inc[employer]

## 2016-05-09 NOTE — Progress Notes (Signed)
[facility] Consultation      MRN# [id number] Brynda PeonFloyd G Slezak Jr. 1951/06/03   EX:[id number]:follow up SOB/DOE   Brief History: 65 year old male, quit smoking in 1994, dyspnea on exertion since 2010, diagnosed with idiopathic pulmonary fibrosis, now on track for lung [facility]. Former patient of Dr. Shelle Ironlance and Dr. Sherene SiresWert   Events since last clinic visit: Patient presents today for a brief follow-up visit  complained of shortness of breath, chronic cough, and chronic hypoxemia. Daily functions are hindered by poor resp status   he has a history of IPF, on Esberit, continued progression of pulmonary fibrosis. His other regimen includes Symbicort/Advair/spiriva  which has not been helpful and he stopped taking it. Can NOT take due to lack of inspiratory capacity  He is a former smoker. He is also on Pepcid 1 tab daily, 20 mg.  No signs of infection at this time Ill appearing, thin, frail      Review of Systems  Constitutional: Positive for malaise/fatigue.  HENT: Negative for sore throat.   Eyes: Negative for blurred vision.  Respiratory: Positive for cough, sputum production, shortness of breath and wheezing.   Gastrointestinal: Positive for heartburn.  Genitourinary: Negative for dysuria.  Neurological: Positive for weakness. Negative for dizziness and headaches.  Endo/Heme/Allergies: Does not bruise/bleed easily.  Psychiatric/Behavioral: The patient is nervous/anxious (1.start predniosne).   All other systems reviewed and are negative.     Allergies:  Atorvastatin and Doxycycline  Physical Examination:  BP 108/60 (BP Location: Left Arm, Cuff Size: Normal)   Pulse 89   Wt 179 lb (81.2 kg)   SpO2 (!) 89%   BMI 24.28 kg/m    General Appearance: + distress, wearing 2 L supplemental oxygen HEENT: PERRLA, no ptosis, no other lesions noticed Pulmonary: Coarse upper airway breath sounds, no use of accessory muscles, dry crackles mainly  throughout lower third of lung bases, + cough on my examination Cardiovascular:  Normal S1,S2.  No m/r/g.     Abdomen:Exam: Benign, Soft, non-tender, No masses  Skin:   warm, no rashes, no ecchymosis  Extremities: normal, +cyanosis, clubbing, warm with normal capillary refill.      Rad results: (The following images and results were reviewed by Dr. Dema SeverinMungal on 05/09/2016). CXR 03/24/16 CLINICAL DATA:  Pt to ED with increased shortness of breath x 1 week, worse last night with cough; known pulmonary fibrosis x 7 years, former former smoker 10+ years ago. Pt denies currently being on at home oxygen.  EXAM: CHEST - 2 VIEW  COMPARISON:  05/09/2015  FINDINGS: Coarse fibrotic interstitial and airspace opacities throughout both lungs, right greater than left, largely stable since prior studies dating back to 04/19/2015. No definite superimposed infiltrate or edema.  Heart size upper limits normal.  No effusion.  Visualized bones unremarkable.   Assessment and Plan:  65 year old male with history of chronic hypoxemia with chronic resp failure with idiopathic primary fibrosis with chronic cough  1.Idiopathic pulmonary fibrosis (HCC) ct 07/2010:  Subpleural fibrosis primarily in bases, +honeycombing PFT's 11/2010: no obstruction, TLC normal, DLCO 81% Autoimmune panel 2012: unremarkable.  CT 11/2012:  Subpleural fibrosis, honeycombing (slight progression from 2011).  PFTs   03/16/2013:  VC 3.89 (82%)  DLCO 20.64  63%  Corrects to 89%  Pirfenidone started 03/2013  (check LFT's q673mos) PFTs 04/2014:    FVC  3.68 (78%), no obstruction, TLC 5.26 (75%), DLCO 21.48 (66%) PFTs 05/09/2015  VC 3.55 (76%) no osbt   DLCO  19.41 (60%) -  05/09/2015   Walked RA x 3 laps @ 185 ft each stopped due to  End of study, brisk pace, no sob Sat 91% at end   - 08/07/2015  Walked RA x 3 laps @ 185 ft each stopped due to  End of study, nl pace, no sob or desat  (96% at end)  - 11/07/2015  Walked RA x 3 laps @ 185  ft each stopped due to  End of study, moderate pace, no sob or desat   - ? Flare since May 2017 > admit [place] and placed on 02  - 04/02/2016 refer to [facility]/ pulmonary [place]   Plan: 1.start Prednisone 20 mg daily 2.start Pulmicort NEBS 3.continue Xopenex nebs as this helps the most 4.guaf-with codeine as needed for cough 5.[facility] on 06/05/16 6.continue H2 Blocker  Overall, prognosis is guarfed and poor, patient with very poor quality of life If patient does NOT get lung transplant, would recommend end of life discussions including DNR/DNI status and assess for hospice    The Patient requires high complexity decision making for assessment and support, frequent evaluation and titration of therapies. Patient/Family are satisfied with Plan of action and management. All questions answered  Lucie LeatherKurian David Destyn Parfitt, M.D.  [facility]  Medical Director [facility] Medical Director [facility]

## 2016-05-10 ENCOUNTER — Telehealth: Payer: Self-pay | Admitting: *Deleted

## 2016-05-10 NOTE — Telephone Encounter (Signed)
Received call from [employer] to do a PA for Esbriet for pt. Approved until 09/22/16 per [employer]. States we will be notified in November 2017 to resubmit for 2018. Pt informed. Nothing further needed.

## 2016-05-13 ENCOUNTER — Ambulatory Visit: Payer: PPO | Admitting: Internal Medicine

## 2016-05-14 ENCOUNTER — Encounter: Payer: PPO | Attending: Internal Medicine | Admitting: Respiratory Therapy

## 2016-05-14 VITALS — Ht 71.5 in | Wt 176.4 lb

## 2016-05-14 DIAGNOSIS — J8410 Pulmonary fibrosis, unspecified: Secondary | ICD-10-CM | POA: Diagnosis present

## 2016-05-14 NOTE — Progress Notes (Signed)
Pulmonary Individual Treatment Plan  Patient Details  Name: Bradley May. MRN: [id number] Date of Birth: [birth year] Referring Provider:   Flowsheet Row Pulmonary Rehab from 05/14/2016 in [facility] Cardiac and Pulmonary Rehab  Referring Provider  Mungal      Initial Encounter Date:  Flowsheet Row Pulmonary Rehab from 05/14/2016 in [facility] Cardiac and Pulmonary Rehab  Date  05/14/16  Referring Provider  Mungal      Visit Diagnosis: Pulmonary fibrosis (HCC)  Patient's Home Medications on Admission:  Current Outpatient Prescriptions:    albuterol (PROVENTIL HFA;VENTOLIN HFA) 108 (90 Base) MCG/ACT inhaler, Inhale 2 puffs into the lungs every 6 (six) hours as needed for wheezing or shortness of breath., Disp: 1 Inhaler, Rfl: 0   aspirin EC 81 MG tablet, Take 81 mg by mouth daily., Disp: , Rfl:    budesonide (PULMICORT) 0.25 MG/2ML nebulizer solution, Take 2 mLs (0.25 mg total) by nebulization 2 (two) times daily., Disp: 60 mL, Rfl: 4   clonazePAM (KLONOPIN) 0.5 MG tablet, Take 1 tablet (0.5 mg total) by mouth 2 (two) times daily as needed (anxiety)., Disp: 30 tablet, Rfl: 0   CRESTOR 40 MG tablet, TAKE 1 TABLET EVERY DAY, Disp: 30 tablet, Rfl: 11   fluticasone (FLONASE) 50 MCG/ACT nasal spray, USE TWO PUFFS INTO EACH NOSTRIL DAILY, Disp: 16 g, Rfl: 2   guaiFENesin (MUCINEX) 600 MG 12 hr tablet, Take 1 tablet (600 mg total) by mouth 2 (two) times daily. (Patient taking differently: Take 600 mg by mouth every morning. ), Disp: 10 tablet, Rfl: 0   guaiFENesin-codeine (ROBITUSSIN AC) 100-10 MG/5ML syrup, Take 5 mLs by mouth 3 (three) times daily as needed for cough., Disp: 120 mL, Rfl: 0   levalbuterol (XOPENEX) 1.25 MG/0.5ML nebulizer solution, Take 1.25 mg by nebulization every 6 (six) hours., Disp: 120 vial, Rfl: 4   metoprolol succinate (TOPROL-XL) 25 MG 24 hr tablet, TAKE 1 TABLET EVERY DAY, Disp: 90 tablet, Rfl: 3   nitroGLYCERIN (NITROSTAT) 0.4 MG SL tablet, Place 1 tablet (0.4  mg total) under the tongue every 5 (five) minutes as needed. For chest pain. May repeat 3 times, Disp: 25 tablet, Rfl: 1   OXYGEN, Inhale 2 L/min into the lungs continuous. 2lpm 24/7 AHC , Disp: , Rfl:    Pirfenidone (ESBRIET) 267 MG CAPS, Take 3 capsules by mouth 3 (three) times daily., Disp: 270 capsule, Rfl: 5   predniSONE (DELTASONE) 20 MG tablet, Take 1 tablet (20 mg total) by mouth daily with breakfast., Disp: 30 tablet, Rfl: 5   predniSONE (DELTASONE) 5 MG tablet, 6 tabs po day1; 5 tabs po day2; 4 tabs po day3; 3 tabs po day4; 2 tabs po day5,6; 1 tab day7,8, Disp: 24 tablet, Rfl: 0   silodosin (RAPAFLO) 8 MG CAPS capsule, Take 8 mg by mouth daily as needed., Disp: , Rfl:    traMADol (ULTRAM) 50 MG tablet, TAKE ONE TABLET EVERY SIX HOURS AS NEEDED FOR COUGH, Disp: 60 tablet, Rfl: 5   valsartan (DIOVAN) 80 MG tablet, TAKE 1 TABLET EVERY DAY (Patient taking differently: TAKE 1 TABLET BY MOUTH EVERY MORNING.), Disp: 30 tablet, Rfl: 11  Past Medical History: Past Medical History:  Diagnosis Date   Allergic rhinitis    BPH (benign prostatic hypertrophy)    Dr. [name]   CAD (coronary artery disease)    Dr. [name]   Idiopathic pulmonary fibrosis (HCC)    Interstitial lung disease (HCC)    Dr. [name]    Tobacco Use: History  Smoking  Status   Former Smoker   Packs/day: 1.00   Years: 25.00   Types: Cigarettes   Quit date: 09/23/1990  Smokeless Tobacco   Never Used    Labs: Recent Review Flowsheet Data    Labs for ITP Cardiac and Pulmonary Rehab Latest Ref Rng & Units 02/03/2013 02/04/2014 02/15/2015 02/27/2016 03/24/2016   Cholestrol 0 - 200 mg/dL 161 096 045 409 -   LDLCALC 0 - 99 mg/dL 75 80 811(B) 99 -   HDL >39.00 mg/dL 14.78 29.56(O) 13.08 65.78 -   Trlycerides 0.0 - 149.0 mg/dL 46.9 629.0(H) 137.0 96.0 -   Hemoglobin A1c 4.0 - 6.0 % - - - - 5.7   HCO3 21.0 - 28.0 mEq/L - - - - 28.5(H)   O2SAT % - - - - 78.6       ADL UCSD:     Pulmonary Assessment Scores     Row Name 05/14/16 1037         ADL UCSD   ADL Phase Entry     SOB Score total 81     Rest 0     Walk 3     Stairs 5     Bath 4     Dress 3     Shop 5        Pulmonary Function Assessment:     Pulmonary Function Assessment - 05/14/16 1035      Pulmonary Function Tests   RV% 67 %   DLCO% 60 %     Initial Spirometry Results   FVC% 75 %   FEV1% 84 %   FEV1/FVC Ratio 84   Comments Test results 05/09/2015     Post Bronchodilator Spirometry Results   FVC% 75 %   FEV1% 87 %   FEV1/FVC Ratio 87     Breath   Bilateral Breath Sounds Clear   Shortness of Breath Yes;Limiting activity;Fear of Shortness of Breath      Exercise Target Goals: Date: 05/14/16  Exercise Program Goal: Individual exercise prescription set with THRR, safety & activity barriers. Participant demonstrates ability to understand and report RPE using BORG scale, to self-measure pulse accurately, and to acknowledge the importance of the exercise prescription.  Exercise Prescription Goal: Starting with aerobic activity 30 plus minutes a day, 3 days per week for initial exercise prescription. Provide home exercise prescription and guidelines that participant acknowledges understanding prior to discharge.  Activity Barriers & Risk Stratification:     Activity Barriers & Cardiac Risk Stratification - 05/14/16 1034      Activity Barriers & Cardiac Risk Stratification   Activity Barriers Shortness of Breath;Deconditioning;Muscular Weakness   Cardiac Risk Stratification Moderate      6 Minute Walk:     6 Minute Walk    Row Name 05/14/16 1025         6 Minute Walk   Distance 253 feet     Walk Time 2.9 minutes     MPH 0.99     METS 1.8     RPE 15     Perceived Dyspnea  5     VO2 Peak 6.33     Resting HR 86 bpm     Resting BP 130/72     Max Ex. HR 121 bpm     Max Ex. BP 128/70       Interval HR   Baseline HR 86     1 Minute HR 87     2 Minute HR 96  3 Minute HR 121     Interval Heart  Rate? Yes       Interval Oxygen   Interval Oxygen? Yes     Baseline Oxygen Saturation % 97 %     Baseline Liters of Oxygen 10 L     1 Minute Oxygen Saturation % 97 %     1 Minute Liters of Oxygen 10 L     2 Minute Oxygen Saturation % 92 %     2 Minute Liters of Oxygen 10 L     3 Minute Oxygen Saturation % 86 %     3 Minute Liters of Oxygen 10 L        Initial Exercise Prescription:     Initial Exercise Prescription - 05/14/16 1000      Date of Initial Exercise RX and Referring Provider   Date 05/14/16   Referring Provider Mungal     Oxygen   Oxygen Continuous   Liters 10     Treadmill   MPH 0.8   Grade 0   Minutes 15  2/2/2/2 with rest     NuStep   Level 1   Minutes 15   METs 15  5/5/5     Biostep-RELP   Level 1   Minutes 15  5/5/5     Prescription Details   Frequency (times per week) 3   Duration Progress to 30 minutes of continuous aerobic without signs/symptoms of physical distress     Intensity   THRR 40-80% of Max Heartrate 113-141   Ratings of Perceived Exertion 11-13   Perceived Dyspnea 0-4     Progression   Progression Continue to progress workloads to maintain intensity without signs/symptoms of physical distress.     Resistance Training   Training Prescription Yes   Weight 1   Reps 10-15      Perform Capillary Blood Glucose checks as needed.  Exercise Prescription Changes:   Exercise Comments:     Exercise Comments    Row Name 05/14/16 1027           Exercise Comments O2 sat at 80 immdiately post walk - came up to 99% with PLB          Discharge Exercise Prescription (Final Exercise Prescription Changes):    Nutrition:  Target Goals: Understanding of nutrition guidelines, daily intake of sodium 1500mg , cholesterol 200mg , calories 30% from fat and 7% or less from saturated fats, daily to have 5 or more servings of fruits and vegetables.  Biometrics:     Pre Biometrics - 05/14/16 1024      Pre Biometrics   Height  5' 11.5" (1.816 m)   Weight 176 lb 6.4 oz (80 kg)   Waist Circumference 39.75 inches   Hip Circumference 41 inches   Waist to Hip Ratio 0.97 %   BMI (Calculated) 24.3       Nutrition Therapy Plan and Nutrition Goals:   Nutrition Discharge: Rate Your Plate Scores:   Psychosocial: Target Goals: Acknowledge presence or absence of depression, maximize coping skills, provide positive support system. Participant is able to verbalize types and ability to use techniques and skills needed for reducing stress and depression.  Initial Review & Psychosocial Screening:     Initial Psych Review & Screening - 05/14/16 1049      Family Dynamics   Good Support System? Yes   Comments Mr Cortez has good support from his wife. She is retiring next week and will be able to be with  him fulltime through the lung transplant process. He state he does have depression with all the medical issues of his pulmonary fibrosis and facing the lung transplant surgery. He is very hopeful about pulmonary rehab and being more activie.     Barriers   Psychosocial barriers to participate in program The patient should benefit from training in stress management and relaxation.     Screening Interventions   Interventions Encouraged to exercise;Program counselor consult      Quality of Life Scores:     Quality of Life - 05/14/16 1053      Quality of Life Scores   Health/Function Pre 20.63 %   Socioeconomic Pre 19.75 %   Psych/Spiritual Pre 20.29 %   Family Pre 21 %   GLOBAL Pre 20.42 %      PHQ-9: Recent Review Flowsheet Data    [facility]   Decreased Interest 0   Down, Depressed, Hopeless 0   PHQ - 2 Score 0   Altered sleeping 0   Tired, decreased energy 2   Change in appetite 0   Feeling bad or failure about yourself  0   Trouble concentrating 0   Moving slowly or fidgety/restless 2   Suicidal thoughts 0   PHQ-9 Score 4   Difficult doing work/chores Extremely  dIfficult      Psychosocial Evaluation and Intervention:   Psychosocial Re-Evaluation:  Education: Education Goals: Education classes will be provided on a weekly basis, covering required topics. Participant will state understanding/return demonstration of topics presented.  Learning Barriers/Preferences:     Learning Barriers/Preferences - 05/14/16 1035      Learning Barriers/Preferences   Learning Barriers None   Learning Preferences Group Instruction;Individual Instruction;Pictoral;Skilled Demonstration;Verbal Instruction;Video;Written Material      Education Topics: Initial Evaluation Education: - Verbal, written and demonstration of respiratory meds, RPE/PD scales, oximetry and breathing techniques. Instruction on use of nebulizers and MDIs: cleaning and proper use, rinsing mouth with steroid doses and importance of monitoring MDI activations. Flow[facility]  Date  05/14/16  Educator  LB  Instruction Review Code  2- meets goals/outcomes      General Nutrition Guidelines/Fats and Fiber: -Group instruction provided by verbal, written material, models and posters to present the general guidelines for heart healthy nutrition. Gives an explanation and review of dietary fats and fiber.   Controlling Sodium/Reading Food Labels: -Group verbal and written material supporting the discussion of sodium use in heart healthy nutrition. Review and explanation with models, verbal and written materials for utilization of the food label.   Exercise Physiology & Risk Factors: - Group verbal and written instruction with models to review the exercise physiology of the cardiovascular system and associated critical values. Details cardiovascular disease risk factors and the goals associated with each risk factor.   Aerobic Exercise & Resistance Training: - Gives group verbal and written discussion on the health impact of  inactivity. On the components of aerobic and resistive training programs and the benefits of this training and how to safely progress through these programs.   Flexibility, Balance, General Exercise Guidelines: - Provides group verbal and written instruction on the benefits of flexibility and balance training programs. Provides general exercise guidelines with specific guidelines to those with heart or lung disease. Demonstration and skill practice provided.   Stress Management: - Provides group verbal and written instruction about the health risks of elevated stress, cause of high stress, and healthy ways  to reduce stress.   Depression: - Provides group verbal and written instruction on the correlation between heart/lung disease and depressed mood, treatment options, and the stigmas associated with seeking treatment.   Exercise & Equipment Safety: - Individual verbal instruction and demonstration of equipment use and safety with use of the equipment.   Infection Prevention: - Provides verbal and written material to individual with discussion of infection control including proper hand washing and proper equipment cleaning during exercise session.   Falls Prevention: - Provides verbal and written material to individual with discussion of falls prevention and safety.   Diabetes: - Individual verbal and written instruction to review signs/symptoms of diabetes, desired ranges of glucose level fasting, after meals and with exercise. Advice that pre and post exercise glucose checks will be done for 3 sessions at entry of program.   Chronic Lung Diseases: - Group verbal and written instruction to review new updates, new respiratory medications, new advancements in procedures and treatments. Provide informative websites and "800" numbers of self-education.   Lung Procedures: - Group verbal and written instruction to describe testing methods done to diagnose lung disease. Review the  outcome of test results. Describe the treatment choices: Pulmonary Function Tests, ABGs and oximetry.   Energy Conservation: - Provide group verbal and written instruction for methods to conserve energy, plan and organize activities. Instruct on pacing techniques, use of adaptive equipment and posture/positioning to relieve shortness of breath.   Triggers: - Group verbal and written instruction to review types of environmental controls: home humidity, furnaces, filters, dust mite/pet prevention, HEPA vacuums. To discuss weather changes, air quality and the benefits of nasal washing.   Exacerbations: - Group verbal and written instruction to provide: warning signs, infection symptoms, calling MD promptly, preventive modes, and value of vaccinations. Review: effective airway clearance, coughing and/or vibration techniques. Create an Sport and exercise psychologist.   Oxygen: - Individual and group verbal and written instruction on oxygen therapy. Includes supplement oxygen, available portable oxygen systems, continuous and intermittent flow rates, oxygen safety, concentrators, and Medicare reimbursement for oxygen. Flowsheet Row Pulmonary Rehab from 05/14/2016 in [facility] Cardiac and Pulmonary Rehab  Date  05/14/16  Educator  LB  Instruction Review Code  2- meets goals/outcomes      Respiratory Medications: - Group verbal and written instruction to review medications for lung disease. Drug class, frequency, complications, importance of spacers, rinsing mouth after steroid MDI's, and proper cleaning methods for nebulizers. Flowsheet [facility]  Date  05/14/16  Educator  LB  Instruction Review Code  2- meets goals/outcomes      AED/CPR: - Group verbal and written instruction with the use of models to demonstrate the basic use of the AED with the basic ABC's of resuscitation.   Breathing Retraining: - Provides individuals verbal and written  instruction on purpose, frequency, and proper technique of diaphragmatic breathing and pursed-lipped breathing. Applies individual practice skills. Flowsheet Row Pulmonary Rehab from 05/14/2016 in [facility] Cardiac and Pulmonary Rehab  Date  05/14/16  Educator  LB  Instruction Review Code  2- meets goals/outcomes      Anatomy and Physiology of the Lungs: - Group verbal and written instruction with the use of models to provide basic lung anatomy and physiology related to function, structure and complications of lung disease.   Heart Failure: - Group verbal and written instruction on the basics of heart failure: signs/symptoms, treatments, explanation of ejection fraction, enlarged heart and cardiomyopathy.   Sleep Apnea: -  Individual verbal and written instruction to review Obstructive Sleep Apnea. Review of risk factors, methods for diagnosing and types of masks and machines for OSA.   Anxiety: - Provides group, verbal and written instruction on the correlation between heart/lung disease and anxiety, treatment options, and management of anxiety.   Relaxation: - Provides group, verbal and written instruction about the benefits of relaxation for patients with heart/lung disease. Also provides patients with examples of relaxation techniques.   Knowledge Questionnaire Score:     Knowledge Questionnaire Score - 05/14/16 1035      Knowledge Questionnaire Score   Pre Score 9/10       Core Components/Risk Factors/Patient Goals at Admission:     Personal Goals and Risk Factors at Admission - 05/14/16 1043      Core Components/Risk Factors/Patient Goals on Admission   Sedentary Yes   Intervention Provide advice, education, support and counseling about physical activity/exercise needs.;Develop an individualized exercise prescription for aerobic and resistive training based on initial evaluation findings, risk stratification, comorbidities and participant's personal goals.   Expected  Outcomes Achievement of increased cardiorespiratory fitness and enhanced flexibility, muscular endurance and strength shown through measurements of functional capacity and personal statement of participant.   Increase Strength and Stamina Yes   Intervention Provide advice, education, support and counseling about physical activity/exercise needs.;Develop an individualized exercise prescription for aerobic and resistive training based on initial evaluation findings, risk stratification, comorbidities and participant's personal goals.   Expected Outcomes Achievement of increased cardiorespiratory fitness and enhanced flexibility, muscular endurance and strength shown through measurements of functional capacity and personal statement of participant.   Improve shortness of breath with ADL's Yes   Intervention Provide education, individualized exercise plan and daily activity instruction to help decrease symptoms of SOB with activities of daily living.   Expected Outcomes Short Term: Achieves a reduction of symptoms when performing activities of daily living.   Develop more efficient breathing techniques such as purse lipped breathing and diaphragmatic breathing; and practicing self-pacing with activity Yes   Intervention Provide education, demonstration and support about specific breathing techniuqes utilized for more efficient breathing. Include techniques such as pursed lipped breathing, diaphragmatic breathing and self-pacing activity.   Expected Outcomes Short Term: Participant will be able to demonstrate and use breathing techniques as needed throughout daily activities.   Increase knowledge of respiratory medications and ability to use respiratory devices properly  Yes   Intervention Provide education and demonstration as needed of appropriate use of medications, inhalers, and oxygen therapy.   Expected Outcomes Short Term: Achieves understanding of medications use. Understands that oxygen is a  medication prescribed by physician. Demonstrates appropriate use of inhaler and oxygen therapy.   Heart Failure Yes   Intervention Provide a combined exercise and nutrition program that is supplemented with education, support and counseling about heart failure. Directed toward relieving symptoms such as shortness of breath, decreased exercise tolerance, and extremity edema.   Expected Outcomes Improve functional capacity of life;Short term: Attendance in program 2-3 days a week with increased exercise capacity. Reported lower sodium intake. Reported increased fruit and vegetable intake. Reports medication compliance.;Short term: Daily weights obtained and reported for increase. Utilizing diuretic protocols set by physician.;Long term: Adoption of self-care skills and reduction of barriers for early signs and symptoms recognition and intervention leading to self-care maintenance.      Core Components/Risk Factors/Patient Goals Review:    Core Components/Risk Factors/Patient Goals at Discharge (Final Review):    ITP Comments:   Comments: Mr Umbarger  plans to start [facility] on 05/22/2016 and attend 3 days/week.

## 2016-05-14 NOTE — Patient Instructions (Signed)
Patient Instructions  Patient Details  Name: Bradley PeonFloyd G Hafley Jr. MRN: [id number] Date of Birth: Jan 13, 1951 Referring Provider:  Stephanie AcreMungal, Vishal, MD  Below are the personal goals you chose as well as exercise and nutrition goals. Our goal is to help you keep on track towards obtaining and maintaining your goals. We will be discussing your progress on these goals with you throughout the program.  Initial Exercise Prescription:     Initial Exercise Prescription - 05/14/16 1000      Date of Initial Exercise RX and Referring Provider   Date 05/14/16   Referring Provider Mungal     Oxygen   Oxygen Continuous   Liters 10     Treadmill   MPH 0.8   Grade 0   Minutes 15  2/2/2/2 with rest     NuStep   Level 1   Minutes 15   METs 15  5/5/5     Biostep-RELP   Level 1   Minutes 15  5/5/5     Prescription Details   Frequency (times per week) 3   Duration Progress to 30 minutes of continuous aerobic without signs/symptoms of physical distress     Intensity   THRR 40-80% of Max Heartrate 113-141   Ratings of Perceived Exertion 11-13   Perceived Dyspnea 0-4     Progression   Progression Continue to progress workloads to maintain intensity without signs/symptoms of physical distress.     Resistance Training   Training Prescription Yes   Weight 1   Reps 10-15      Exercise Goals: Frequency: Be able to perform aerobic exercise three times per week working toward 3-5 days per week.  Intensity: Work with a perceived exertion of 11 (fairly light) - 15 (hard) as tolerated. Follow your new exercise prescription and watch for changes in prescription as you progress with the program. Changes will be reviewed with you when they are made.  Duration: You should be able to do 30 minutes of continuous aerobic exercise in addition to a 5 minute warm-up and a 5 minute cool-down routine.  Nutrition Goals: Your personal nutrition goals will be established when you do your nutrition  analysis with the dietician.  The following are nutrition guidelines to follow: Cholesterol < 200mg /day Sodium < 1500mg /day Fiber: Men over 50 yrs - 30 grams per day  Personal Goals:     Personal Goals and Risk Factors at Admission - 05/14/16 1043      Core Components/Risk Factors/Patient Goals on Admission   Sedentary Yes   Intervention Provide advice, education, support and counseling about physical activity/exercise needs.;Develop an individualized exercise prescription for aerobic and resistive training based on initial evaluation findings, risk stratification, comorbidities and participant's personal goals.   Expected Outcomes Achievement of increased cardiorespiratory fitness and enhanced flexibility, muscular endurance and strength shown through measurements of functional capacity and personal statement of participant.   Increase Strength and Stamina Yes   Intervention Provide advice, education, support and counseling about physical activity/exercise needs.;Develop an individualized exercise prescription for aerobic and resistive training based on initial evaluation findings, risk stratification, comorbidities and participant's personal goals.   Expected Outcomes Achievement of increased cardiorespiratory fitness and enhanced flexibility, muscular endurance and strength shown through measurements of functional capacity and personal statement of participant.   Improve shortness of breath with ADL's Yes   Intervention Provide education, individualized exercise plan and daily activity instruction to help decrease symptoms of SOB with activities of daily living.   Expected Outcomes Short Term:  Achieves a reduction of symptoms when performing activities of daily living.   Develop more efficient breathing techniques such as purse lipped breathing and diaphragmatic breathing; and practicing self-pacing with activity Yes   Intervention Provide education, demonstration and support about specific  breathing techniuqes utilized for more efficient breathing. Include techniques such as pursed lipped breathing, diaphragmatic breathing and self-pacing activity.   Expected Outcomes Short Term: Participant will be able to demonstrate and use breathing techniques as needed throughout daily activities.   Increase knowledge of respiratory medications and ability to use respiratory devices properly  Yes   Intervention Provide education and demonstration as needed of appropriate use of medications, inhalers, and oxygen therapy.   Expected Outcomes Short Term: Achieves understanding of medications use. Understands that oxygen is a medication prescribed by physician. Demonstrates appropriate use of inhaler and oxygen therapy.   Heart Failure Yes   Intervention Provide a combined exercise and nutrition program that is supplemented with education, support and counseling about heart failure. Directed toward relieving symptoms such as shortness of breath, decreased exercise tolerance, and extremity edema.   Expected Outcomes Improve functional capacity of life;Short term: Attendance in program 2-3 days a week with increased exercise capacity. Reported lower sodium intake. Reported increased fruit and vegetable intake. Reports medication compliance.;Short term: Daily weights obtained and reported for increase. Utilizing diuretic protocols set by physician.;Long term: Adoption of self-care skills and reduction of barriers for early signs and symptoms recognition and intervention leading to self-care maintenance.      Tobacco Use Initial Evaluation: History  Smoking Status   Former Smoker   Packs/day: 1.00   Years: 25.00   Types: Cigarettes   Quit date: 09/23/1990  Smokeless Tobacco   Never Used    Copy of goals given to participant.

## 2016-05-20 ENCOUNTER — Encounter: Payer: Self-pay | Admitting: Emergency Medicine

## 2016-05-20 ENCOUNTER — Inpatient Hospital Stay
Admission: EM | Admit: 2016-05-20 | Discharge: 2016-06-23 | DRG: 196 | Disposition: E | Payer: PPO | Attending: Internal Medicine | Admitting: Internal Medicine

## 2016-05-20 ENCOUNTER — Other Ambulatory Visit: Payer: Self-pay

## 2016-05-20 ENCOUNTER — Emergency Department: Payer: PPO

## 2016-05-20 DIAGNOSIS — Z833 Family history of diabetes mellitus: Secondary | ICD-10-CM | POA: Diagnosis not present

## 2016-05-20 DIAGNOSIS — J9621 Acute and chronic respiratory failure with hypoxia: Secondary | ICD-10-CM

## 2016-05-20 DIAGNOSIS — J8410 Pulmonary fibrosis, unspecified: Secondary | ICD-10-CM | POA: Diagnosis present

## 2016-05-20 DIAGNOSIS — Z87891 Personal history of nicotine dependence: Secondary | ICD-10-CM | POA: Diagnosis not present

## 2016-05-20 DIAGNOSIS — I2510 Atherosclerotic heart disease of native coronary artery without angina pectoris: Secondary | ICD-10-CM | POA: Diagnosis present

## 2016-05-20 DIAGNOSIS — E43 Unspecified severe protein-calorie malnutrition: Secondary | ICD-10-CM | POA: Insufficient documentation

## 2016-05-20 DIAGNOSIS — Z515 Encounter for palliative care: Secondary | ICD-10-CM | POA: Diagnosis present

## 2016-05-20 DIAGNOSIS — J84112 Idiopathic pulmonary fibrosis: Principal | ICD-10-CM

## 2016-05-20 DIAGNOSIS — R600 Localized edema: Secondary | ICD-10-CM | POA: Diagnosis present

## 2016-05-20 DIAGNOSIS — Z9889 Other specified postprocedural states: Secondary | ICD-10-CM

## 2016-05-20 DIAGNOSIS — Z888 Allergy status to other drugs, medicaments and biological substances status: Secondary | ICD-10-CM | POA: Diagnosis not present

## 2016-05-20 DIAGNOSIS — I10 Essential (primary) hypertension: Secondary | ICD-10-CM | POA: Diagnosis present

## 2016-05-20 DIAGNOSIS — F329 Major depressive disorder, single episode, unspecified: Secondary | ICD-10-CM | POA: Diagnosis present

## 2016-05-20 DIAGNOSIS — J438 Other emphysema: Secondary | ICD-10-CM | POA: Diagnosis present

## 2016-05-20 DIAGNOSIS — Z7982 Long term (current) use of aspirin: Secondary | ICD-10-CM | POA: Diagnosis not present

## 2016-05-20 DIAGNOSIS — Z7951 Long term (current) use of inhaled steroids: Secondary | ICD-10-CM

## 2016-05-20 DIAGNOSIS — Z66 Do not resuscitate: Secondary | ICD-10-CM | POA: Diagnosis present

## 2016-05-20 DIAGNOSIS — B370 Candidal stomatitis: Secondary | ICD-10-CM | POA: Diagnosis present

## 2016-05-20 DIAGNOSIS — Z955 Presence of coronary angioplasty implant and graft: Secondary | ICD-10-CM

## 2016-05-20 DIAGNOSIS — R220 Localized swelling, mass and lump, head: Secondary | ICD-10-CM | POA: Diagnosis not present

## 2016-05-20 DIAGNOSIS — J9620 Acute and chronic respiratory failure, unspecified whether with hypoxia or hypercapnia: Secondary | ICD-10-CM | POA: Diagnosis present

## 2016-05-20 DIAGNOSIS — R221 Localized swelling, mass and lump, neck: Secondary | ICD-10-CM

## 2016-05-20 DIAGNOSIS — E785 Hyperlipidemia, unspecified: Secondary | ICD-10-CM | POA: Diagnosis present

## 2016-05-20 DIAGNOSIS — Z7952 Long term (current) use of systemic steroids: Secondary | ICD-10-CM

## 2016-05-20 DIAGNOSIS — N400 Enlarged prostate without lower urinary tract symptoms: Secondary | ICD-10-CM | POA: Diagnosis present

## 2016-05-20 DIAGNOSIS — Z79899 Other long term (current) drug therapy: Secondary | ICD-10-CM

## 2016-05-20 DIAGNOSIS — Z8249 Family history of ischemic heart disease and other diseases of the circulatory system: Secondary | ICD-10-CM

## 2016-05-20 DIAGNOSIS — Z6821 Body mass index (BMI) 21.0-21.9, adult: Secondary | ICD-10-CM

## 2016-05-20 DIAGNOSIS — Z8042 Family history of malignant neoplasm of prostate: Secondary | ICD-10-CM

## 2016-05-20 DIAGNOSIS — I252 Old myocardial infarction: Secondary | ICD-10-CM

## 2016-05-20 DIAGNOSIS — J9383 Other pneumothorax: Secondary | ICD-10-CM | POA: Diagnosis present

## 2016-05-20 DIAGNOSIS — E222 Syndrome of inappropriate secretion of antidiuretic hormone: Secondary | ICD-10-CM | POA: Diagnosis present

## 2016-05-20 DIAGNOSIS — J9601 Acute respiratory failure with hypoxia: Secondary | ICD-10-CM | POA: Diagnosis present

## 2016-05-20 DIAGNOSIS — I2781 Cor pulmonale (chronic): Secondary | ICD-10-CM | POA: Diagnosis present

## 2016-05-20 DIAGNOSIS — F419 Anxiety disorder, unspecified: Secondary | ICD-10-CM | POA: Diagnosis present

## 2016-05-20 DIAGNOSIS — Z9981 Dependence on supplemental oxygen: Secondary | ICD-10-CM

## 2016-05-20 LAB — BRAIN NATRIURETIC PEPTIDE: B NATRIURETIC PEPTIDE 5: 101 pg/mL — AB (ref 0.0–100.0)

## 2016-05-20 LAB — CBC
HEMATOCRIT: 43.8 % (ref 40.0–52.0)
HEMOGLOBIN: 14.8 g/dL (ref 13.0–18.0)
MCH: 31.4 pg (ref 26.0–34.0)
MCHC: 33.8 g/dL (ref 32.0–36.0)
MCV: 93.1 fL (ref 80.0–100.0)
Platelets: 237 10*3/uL (ref 150–440)
RBC: 4.71 MIL/uL (ref 4.40–5.90)
RDW: 13.1 % (ref 11.5–14.5)
WBC: 15.7 10*3/uL — AB (ref 3.8–10.6)

## 2016-05-20 LAB — BASIC METABOLIC PANEL
ANION GAP: 8 (ref 5–15)
BUN: 10 mg/dL (ref 6–20)
CHLORIDE: 91 mmol/L — AB (ref 101–111)
CO2: 30 mmol/L (ref 22–32)
Calcium: 9.3 mg/dL (ref 8.9–10.3)
Creatinine, Ser: 0.89 mg/dL (ref 0.61–1.24)
GFR calc Af Amer: >60 mL/min (ref >60)
GLUCOSE: 126 mg/dL — AB (ref 65–99)
POTASSIUM: 4.2 mmol/L (ref 3.5–5.1)
SODIUM: 129 mmol/L — AB (ref 135–145)

## 2016-05-20 LAB — TROPONIN I: Troponin I: <0.03 ng/mL (ref <0.03)

## 2016-05-20 MED ORDER — GUAIFENESIN-CODEINE 100-10 MG/5ML PO SOLN
5.0000 mL | Freq: Three times a day (TID) | ORAL | Status: DC | PRN
  Administered 2016-05-20 – 2016-05-21 (×2): 5 mL via ORAL
  Filled 2016-05-20 (×2): qty 5

## 2016-05-20 MED ORDER — ZOLPIDEM TARTRATE 5 MG PO TABS
5.0000 mg | ORAL_TABLET | Freq: Once | ORAL | Status: AC
  Administered 2016-05-20: 22:00:00 5 mg via ORAL
  Filled 2016-05-20: qty 1

## 2016-05-20 MED ORDER — FLUOXETINE HCL 10 MG PO CAPS: 10.0000 mg | ORAL_CAPSULE | Freq: Every day | ORAL | Status: DC

## 2016-05-20 MED ORDER — FLUTICASONE PROPIONATE 50 MCG/ACT NA SUSP
1.0000 | Freq: Every day | NASAL | Status: DC
  Administered 2016-05-23: 1 via NASAL
  Filled 2016-05-20: qty 16

## 2016-05-20 MED ORDER — TRAZODONE HCL 50 MG PO TABS
100.0000 mg | ORAL_TABLET | Freq: Every day | ORAL | Status: DC
  Administered 2016-05-20 – 2016-05-22 (×3): 100 mg via ORAL
  Filled 2016-05-20 (×3): qty 1

## 2016-05-20 MED ORDER — BENZONATATE 100 MG PO CAPS
200.0000 mg | ORAL_CAPSULE | Freq: Three times a day (TID) | ORAL | Status: DC | PRN
  Administered 2016-05-20 – 2016-05-22 (×4): 200 mg via ORAL
  Filled 2016-05-20 (×4): qty 2

## 2016-05-20 MED ORDER — ASPIRIN EC 81 MG PO TBEC
81.0000 mg | DELAYED_RELEASE_TABLET | Freq: Every day | ORAL | Status: DC
  Administered 2016-05-21 – 2016-05-23 (×3): 81 mg via ORAL
  Filled 2016-05-20 (×3): qty 1

## 2016-05-20 MED ORDER — METHYLPREDNISOLONE SODIUM SUCC 125 MG IJ SOLR
60.0000 mg | Freq: Four times a day (QID) | INTRAMUSCULAR | Status: DC
  Administered 2016-05-20 – 2016-05-22 (×7): 60 mg via INTRAVENOUS
  Filled 2016-05-20 (×7): qty 2

## 2016-05-20 MED ORDER — METOPROLOL SUCCINATE ER 50 MG PO TB24
25.0000 mg | ORAL_TABLET | Freq: Every day | ORAL | Status: DC
  Administered 2016-05-21 – 2016-05-23 (×3): 25 mg via ORAL
  Filled 2016-05-20 (×3): qty 1

## 2016-05-20 MED ORDER — IPRATROPIUM-ALBUTEROL 0.5-2.5 (3) MG/3ML IN SOLN: 3.0000 mL | RESPIRATORY_TRACT | Status: DC

## 2016-05-20 MED ORDER — BUDESONIDE 0.25 MG/2ML IN SUSP
0.2500 mg | Freq: Two times a day (BID) | RESPIRATORY_TRACT | Status: DC
Start: 2016-05-20 — End: 2016-05-20

## 2016-05-20 MED ORDER — FUROSEMIDE 10 MG/ML IJ SOLN
40.0000 mg | Freq: Once | INTRAMUSCULAR | Status: AC
  Administered 2016-05-20: 40 mg via INTRAVENOUS
  Filled 2016-05-20: qty 4

## 2016-05-20 MED ORDER — BUDESONIDE 0.25 MG/2ML IN SUSP
0.2500 mg | Freq: Four times a day (QID) | RESPIRATORY_TRACT | Status: DC
  Administered 2016-05-20 – 2016-05-23 (×11): 0.25 mg via RESPIRATORY_TRACT
  Filled 2016-05-20 (×13): qty 2

## 2016-05-20 MED ORDER — TAMSULOSIN HCL 0.4 MG PO CAPS
0.4000 mg | ORAL_CAPSULE | Freq: Every day | ORAL | Status: DC
  Administered 2016-05-21 – 2016-05-23 (×3): 0.4 mg via ORAL
  Filled 2016-05-20 (×4): qty 1

## 2016-05-20 MED ORDER — METHYLPREDNISOLONE SODIUM SUCC 125 MG IJ SOLR
125.0000 mg | Freq: Once | INTRAMUSCULAR | Status: AC
  Administered 2016-05-20: 125 mg via INTRAVENOUS
  Filled 2016-05-20: qty 2

## 2016-05-20 MED ORDER — ROSUVASTATIN CALCIUM 20 MG PO TABS
40.0000 mg | ORAL_TABLET | Freq: Every day | ORAL | Status: DC
  Administered 2016-05-21 – 2016-05-23 (×3): 40 mg via ORAL
  Filled 2016-05-20 (×3): qty 2

## 2016-05-20 MED ORDER — PIRFENIDONE 267 MG PO CAPS
3.0000 | ORAL_CAPSULE | Freq: Three times a day (TID) | ORAL | Status: DC
  Administered 2016-05-20 – 2016-05-23 (×8): 3 via ORAL
  Filled 2016-05-20 (×11): qty 1

## 2016-05-20 MED ORDER — SODIUM CHLORIDE 0.9% FLUSH
3.0000 mL | Freq: Two times a day (BID) | INTRAVENOUS | Status: DC
  Administered 2016-05-20 – 2016-05-24 (×7): 3 mL via INTRAVENOUS

## 2016-05-20 MED ORDER — MORPHINE SULFATE (PF) 2 MG/ML IV SOLN
1.0000 mg | INTRAVENOUS | Status: DC | PRN
  Administered 2016-05-20 – 2016-05-22 (×4): 2 mg via INTRAVENOUS
  Filled 2016-05-20 (×6): qty 1

## 2016-05-20 MED ORDER — IPRATROPIUM-ALBUTEROL 0.5-2.5 (3) MG/3ML IN SOLN
3.0000 mL | Freq: Four times a day (QID) | RESPIRATORY_TRACT | Status: DC
  Administered 2016-05-20 – 2016-05-23 (×11): 3 mL via RESPIRATORY_TRACT
  Filled 2016-05-20 (×13): qty 3

## 2016-05-20 MED ORDER — CLONAZEPAM 0.5 MG PO TABS: 0.5000 mg | ORAL_TABLET | Freq: Two times a day (BID) | ORAL | Status: DC | PRN

## 2016-05-20 MED ORDER — TIOTROPIUM BROMIDE MONOHYDRATE 18 MCG IN CAPS: 18.0000 ug | ORAL_CAPSULE | Freq: Every day | RESPIRATORY_TRACT | Status: DC

## 2016-05-20 MED ORDER — IRBESARTAN 150 MG PO TABS
75.0000 mg | ORAL_TABLET | Freq: Every day | ORAL | Status: DC
  Administered 2016-05-21 – 2016-05-23 (×3): 75 mg via ORAL
  Filled 2016-05-20 (×4): qty 1

## 2016-05-20 MED ORDER — GUAIFENESIN ER 600 MG PO TB12
600.0000 mg | ORAL_TABLET | Freq: Two times a day (BID) | ORAL | Status: DC
  Administered 2016-05-20: 600 mg via ORAL
  Filled 2016-05-20: qty 1

## 2016-05-20 MED ORDER — ALPRAZOLAM 0.25 MG PO TABS
0.2500 mg | ORAL_TABLET | Freq: Three times a day (TID) | ORAL | Status: DC | PRN
  Administered 2016-05-22 (×3): 0.25 mg via ORAL
  Filled 2016-05-20 (×3): qty 1

## 2016-05-20 MED ORDER — ENOXAPARIN SODIUM 40 MG/0.4ML ~~LOC~~ SOLN
40.0000 mg | SUBCUTANEOUS | Status: DC
  Administered 2016-05-20 – 2016-05-22 (×3): 40 mg via SUBCUTANEOUS
  Filled 2016-05-20 (×3): qty 0.4

## 2016-05-20 MED ORDER — LEVALBUTEROL HCL 1.25 MG/0.5ML IN NEBU: 1.2500 mg | INHALATION_SOLUTION | Freq: Four times a day (QID) | RESPIRATORY_TRACT | Status: DC

## 2016-05-20 MED ORDER — TRAMADOL HCL 50 MG PO TABS: 50.0000 mg | ORAL_TABLET | Freq: Four times a day (QID) | ORAL | Status: DC | PRN

## 2016-05-20 MED ORDER — FAMOTIDINE 20 MG PO TABS
20.0000 mg | ORAL_TABLET | Freq: Two times a day (BID) | ORAL | Status: DC
  Administered 2016-05-20 – 2016-05-23 (×6): 20 mg via ORAL
  Filled 2016-05-20 (×6): qty 1

## 2016-05-20 MED ORDER — FENTANYL 12 MCG/HR TD PT72
25.0000 ug | MEDICATED_PATCH | TRANSDERMAL | Status: DC
Start: 2016-05-20 — End: 2016-05-23
  Administered 2016-05-20: 25 ug via TRANSDERMAL
  Filled 2016-05-20: qty 1

## 2016-05-20 NOTE — Care Management (Signed)
Patient presents from home with progressive shortness of breath.  He has had two previous admissions since July 2017 with respiratory issues due to his pulmonary fibrosis.  Lives a bed to chair existence.  Continuous oxygen from [facility].  Patient has never had any home health services.  Would be agreeable.    He lives with his wife who is primary caregiver and is with patient at all times.  [facility].  Patient and wife agreeable to home health nurse services.  No agency preference.  Current with PCP and no issues accessing medical care or obtaining medications.  His activity  tolerance is severely limited by his pulmonary fibrosis

## 2016-05-20 NOTE — ED Notes (Signed)
Pt comes in to ED for increased SOB. Per pt SOB has been increasing slowly since last visit. Pt is waiting to receive a Lung transplant and is maintaining. Lung Sounds clear bilaterally. Cap Refill <3sec. Pt AOx4, family at bedside.

## 2016-05-20 NOTE — H&P (Signed)
[facility] [place] [facility]   PATIENT NAME: Bradley May    MR#:  [id number]  DATE OF BIRTH:  Nov 30, 1950  DATE OF ADMISSION:  05/16/2016  PRIMARY CARE PHYSICIAN: Tillman Abideichard Letvak, MD   REQUESTING/REFERRING PHYSICIAN: Silverio Layyao  CHIEF COMPLAINT:   Chief Complaint  Patient presents with  . Shortness of Breath    HISTORY OF PRESENT ILLNESS: Bradley May  is a 65 y.o. male with a known history of Coronary artery disease, benign prostatic hypertrophy, negative for the pulmonary fibrosis- and recurrent admission in hospital and [facility] for possible lung transplant. He continued to feel short of breath with minimal exertion and could not perform any activities at home. He also have coughing spells in between when he cannot catch his breath and his saturation drops up to 30-40% as per the wife.  Concerned with this continued symptoms he came to emergency room today. He denies any sputum production, fever, chills. As he is resting with oxygen his saturation is stable.  PAST MEDICAL HISTORY:   Past Medical History:  Diagnosis Date  . Allergic rhinitis   . BPH (benign prostatic hypertrophy)    Dr. Achilles Dunkope  . CAD (coronary artery disease)    Dr. Daleen SquibbWall  . Idiopathic pulmonary fibrosis (HCC)   . Interstitial lung disease (HCC)    Dr. Shelle Ironlance    PAST SURGICAL HISTORY: Past Surgical History:  Procedure Laterality Date  . ANGIOPLASTY  1992  . CORONARY ANGIOPLASTY WITH STENT PLACEMENT  01/2001   RCA  . ESOPHAGOGASTRODUODENOSCOPY  01/2001   bleeding  . KNEE SURGERY  1978   Right  . URETERAL STENT PLACEMENT  01/2009   Dr. Achilles Dunkope    SOCIAL HISTORY:  Social History  Substance Use Topics  . Smoking status: Former Smoker    Packs/day: 1.00    Years: 25.00    Types: Cigarettes    Quit date: 09/23/1990  . Smokeless tobacco: Never Used  . Alcohol use Yes     Comment: Social    FAMILY HISTORY:  Family History  Problem Relation Age of Onset  .  Coronary artery disease Father   . Diabetes Father   . Hypertension Father   . Cancer Father     prostate  . Hypertension Mother     DRUG ALLERGIES:  Allergies  Allergen Reactions  . Atorvastatin     REACTION: Headache  . Doxycycline     REACTION: Nausea    REVIEW OF SYSTEMS:   CONSTITUTIONAL: No fever, fatigue or weakness.  EYES: No blurred or double vision.  EARS, NOSE, AND THROAT: No tinnitus or ear pain.  RESPIRATORY: No cough,Positive for shortness of breath, no wheezing or hemoptysis.  CARDIOVASCULAR: No chest pain, orthopnea, edema.  GASTROINTESTINAL: No nausea, vomiting, diarrhea or abdominal pain.  GENITOURINARY: No dysuria, hematuria.  ENDOCRINE: No polyuria, nocturia,  HEMATOLOGY: No anemia, easy bruising or bleeding SKIN: No rash or lesion. MUSCULOSKELETAL: No joint pain or arthritis.   NEUROLOGIC: No tingling, numbness, weakness.  PSYCHIATRY: No anxiety or depression.   MEDICATIONS AT HOME:  Prior to Admission medications   Medication Sig Start Date End Date Taking? Authorizing Provider  aspirin EC 81 MG tablet Take 81 mg by mouth daily.   Yes Historical Provider, MD  budesonide (PULMICORT) 0.25 MG/2ML nebulizer solution Take 2 mLs (0.25 mg total) by nebulization 2 (two) times daily. 05/09/16 05/09/17 Yes Erin FullingKurian Kasa, MD  clonazePAM (KLONOPIN) 0.5 MG tablet Take 1 tablet (0.5 mg total) by mouth  2 (two) times daily as needed (anxiety). 04/30/16  Yes Alford Highland, MD  CRESTOR 40 MG tablet TAKE 1 TABLET EVERY DAY 06/05/15  Yes [name], MD  famotidine (PEPCID) 20 MG tablet Take 20 mg by mouth 2 (two) times daily.   Yes Historical Provider, MD  fluticasone (FLONASE) 50 MCG/ACT nasal spray USE TWO PUFFS INTO EACH NOSTRIL DAILY 01/13/15  Yes [name], MD  levalbuterol (XOPENEX) 1.25 MG/0.5ML nebulizer solution Take 1.25 mg by nebulization every 6 (six) hours. 05/09/16  Yes Erin Fulling, MD  metoprolol succinate (TOPROL-XL) 25 MG 24 hr tablet TAKE 1 TABLET  EVERY DAY 03/14/16  Yes [name], MD  Pirfenidone (ESBRIET) 267 MG CAPS Take 3 capsules by mouth 3 (three) times daily. 05/24/14  Yes Barbaraann Share, MD  predniSONE (DELTASONE) 20 MG tablet Take 1 tablet (20 mg total) by mouth daily with breakfast. Patient taking differently: Take 10 mg by mouth 2 (two) times daily.  05/09/16  Yes Erin Fulling, MD  traMADol (ULTRAM) 50 MG tablet TAKE ONE TABLET EVERY SIX HOURS AS NEEDED FOR COUGH 11/07/15  Yes Nyoka Cowden, MD  valsartan (DIOVAN) 80 MG tablet TAKE 1 TABLET EVERY DAY Patient taking differently: TAKE 1 TABLET BY MOUTH EVERY MORNING. 03/13/16  Yes Lewayne Bunting, MD  albuterol (PROVENTIL HFA;VENTOLIN HFA) 108 (90 Base) MCG/ACT inhaler Inhale 2 puffs into the lungs every 6 (six) hours as needed for wheezing or shortness of breath. Patient not taking: Reported on 05/04/2016 04/30/16   Alford Highland, MD  guaiFENesin (MUCINEX) 600 MG 12 hr tablet Take 1 tablet (600 mg total) by mouth 2 (two) times daily. Patient not taking: Reported on 05/19/2016 03/26/16   Altamese Dilling, MD  guaiFENesin-codeine [facility]) 100-10 MG/5ML syrup Take 5 mLs by mouth 3 (three) times daily as needed for cough. Patient not taking: Reported on 05/02/2016 05/09/16   Erin Fulling, MD  nitroGLYCERIN (NITROSTAT) 0.4 MG SL tablet Place 1 tablet (0.4 mg total) under the tongue every 5 (five) minutes as needed. For chest pain. May repeat 3 times 02/28/16   Lewayne Bunting, MD  OXYGEN Inhale 2 L/min into the lungs continuous. 2lpm 24/7 [facility]     Historical Provider, MD  predniSONE (DELTASONE) 5 MG tablet 6 tabs po day1; 5 tabs po day2; 4 tabs po day3; 3 tabs po day4; 2 tabs po day5,6; 1 tab day7,8 04/30/16   Alford Highland, MD  silodosin (RAPAFLO) 8 MG CAPS capsule Take 8 mg by mouth every other day as needed.  04/07/15   Historical Provider, MD      PHYSICAL EXAMINATION:   VITAL SIGNS: Blood pressure (!) 123/91, pulse (!) 102, temperature 97.6 F (36.4 C), temperature source  Oral, resp. rate 16, height 6' (1.829 m), weight 81.6 kg (180 lb), SpO2 (!) 89 %.  GENERAL:  65 y.o.-year-old patient lying in the bed with no acute distress.  EYES: Pupils equal, round, reactive to light and accommodation. No scleral icterus. Extraocular muscles intact.  HEENT: Head atraumatic, normocephalic. Oropharynx and nasopharynx clear.  NECK:  Supple, no jugular venous distention. No thyroid enlargement, no tenderness.  LUNGS: Decreased breath sounds bilaterally, no wheezing, no crepitation. Positive use of accessory muscles of respiration.  CARDIOVASCULAR: S1, S2 normal. No murmurs, rubs, or gallops.  ABDOMEN: Soft, nontender, nondistended. Bowel sounds present. No organomegaly or mass.  EXTREMITIES: No pedal edema, cyanosis, or clubbing.  NEUROLOGIC: Cranial nerves II through XII are intact. Muscle strength 5/5 in all extremities.  Sensation intact. Gait not checked.  PSYCHIATRIC: The patient is alert and oriented x 3.  SKIN: No obvious rash, lesion, or ulcer.   LABORATORY PANEL:   CBC  Recent Labs Lab 05-29-16 1112  WBC 15.7*  HGB 14.8  HCT 43.8  PLT 237  MCV 93.1  MCH 31.4  MCHC 33.8  RDW 13.1   ------------------------------------------------------------------------------------------------------------------  Chemistries   Recent Labs Lab 2016-05-29 1112  NA 129*  K 4.2  CL 91*  CO2 30  GLUCOSE 126*  BUN 10  CREATININE 0.89  CALCIUM 9.3   ------------------------------------------------------------------------------------------------------------------ estimated creatinine clearance is 90.8 mL/min (by C-G formula based on SCr of 0.89 mg/dL). ------------------------------------------------------------------------------------------------------------------ No results for input(s): TSH, T4TOTAL, T3FREE, THYROIDAB in the last 72 hours.  Invalid input(s): FREET3   Coagulation profile No results for input(s): INR, PROTIME in the last 168  hours. ------------------------------------------------------------------------------------------------------------------- No results for input(s): DDIMER in the last 72 hours. -------------------------------------------------------------------------------------------------------------------  Cardiac Enzymes  Recent Labs Lab May 29, 2016 1112  TROPONINI <0.03   ------------------------------------------------------------------------------------------------------------------ Invalid input(s): POCBNP  ---------------------------------------------------------------------------------------------------------------  Urinalysis No results found for: COLORURINE, APPEARANCEUR, LABSPEC, PHURINE, GLUCOSEU, HGBUR, BILIRUBINUR, KETONESUR, PROTEINUR, UROBILINOGEN, NITRITE, LEUKOCYTESUR   RADIOLOGY: Dg Chest 2 View  Result Date: 05/29/16 CLINICAL DATA:  Shortness of breath.  Cough. EXAM: CHEST  2 VIEW COMPARISON:  Radiograph of April 29, 2016. CT scan of December 07, 2012. FINDINGS: Stable cardiomediastinal silhouette. No pneumothorax or pleural effusion is noted. Stable interstitial densities are noted throughout both lungs concerning for scarring or chronic interstitial inflammation. Superimposed edema or acute inflammation cannot be excluded. Bony thorax is unremarkable. IMPRESSION: Stable diffuse interstitial densities are noted throughout both lungs compared to prior exam concerning for scarring or chronic interstitial inflammation, but superimposed edema or acute inflammation cannot be excluded. Electronically Signed   By: Lupita Raider, M.D.   On: 2016-05-29 12:36    EKG: Orders placed or performed during the hospital encounter of 2016/05/29  . ED EKG  . ED EKG  . EKG 12-Lead  . EKG 12-Lead    IMPRESSION AND PLAN:  * Acute on chronic respiratory failure secondary to idiopathic pulmonary fibrosis   We'll give IV steroid and nebulizer therapy for now.   Pulmonary consult for further  management.   Currently does not look having any infection so I will not give antibiotics.  * Essential hypertension   Continue medications.  * Hyperlipidemia   Continue Crestor.  * Anxiety   Continue Klonopin, give Xanax as needed.  * Depression   As per wife patient is depressed and using all the hopes of improvement in his condition.   Give fluoxetine for now.  All the records are reviewed and case discussed with ED provider. Management plans discussed with the patient, family and they are in agreement.  CODE STATUS: Code Status History    Date Active Date Inactive Code Status Order ID Comments User Context   04/28/2016 12:28 PM 04/29/2016  5:18 PM DNR [id number]  Gracelyn Nurse, MD Inpatient   03/24/2016  1:06 PM 03/26/2016  4:11 PM Full Code [id number]  Katharina Caper, MD Inpatient    Questions for Most Recent Historical Code Status (Order [id number])    Question Answer Comment   In the event of cardiac or respiratory ARREST Do not call a "code blue"    In the event of cardiac or respiratory ARREST Do not perform Intubation, CPR, defibrillation or ACLS    In the event of cardiac or respiratory ARREST Use medication by any  route, position, wound care, and other measures to relive pain and suffering. May use oxygen, suction and manual treatment of airway obstruction as needed for comfort.        TOTAL TIME TAKING CARE OF THIS PATIENT: 50 minutes.  Patient's wife is present in the room.  Altamese Dilling M.D on 06/03/2016   Between 7am to 6pm - Pager - [phone]  After 6pm go to [email or web address] - [email or web address]  [facility]  Office  [phone]  CC: Primary care physician; [name], MD   Note: This dictation was prepared with Dragon dictation along with smaller phrase technology. Any transcriptional errors that result from this process are unintentional.

## 2016-05-20 NOTE — ED Triage Notes (Signed)
Pt presents with shortness of breath on going but worse the past two days. Pt with hx of PF and has had a recent admission for same. Pt wears continuous oxygen 2 liters.

## 2016-05-20 NOTE — ED Notes (Signed)
SpO2 recheck- 94%, 4 L O2

## 2016-05-20 NOTE — Consult Note (Signed)
PULMONARY CONSULT NOTE  Requesting MD/Service: Elisabeth PigeonVachhani Date of initial consultation: 04/30/2016 Reason for consultation: Acute on chronic hypoxic respiratory failure, IPF  [name] with severe oxygen dependent pulmonary fibrosis who [facility] (not yet seen). Admitted via ED with progressive and now severe dyspnea and hypoxemia.  HPI:  [age] M well known to the PCCM service. He has advanced pulmonary fibrosis and has been in a phase of accelerated progression over the past couple of months. I saw him in consultation during his last hospitalization earlier this month and treated him for presumed exacerbation of IPF with systemic steroids. He is again admitted with increasing dyspnea and hypoxemia, progressive over several days. He denies CP, fever, purulent sputum, hemoptysis and calf tenderness. He does have fairly recent onset of symmetric BLE edema.   Past Medical History:  Diagnosis Date  . Allergic rhinitis   . BPH (benign prostatic hypertrophy)    Dr. Achilles Dunkope  . CAD (coronary artery disease)    Dr. Daleen SquibbWall  . Idiopathic pulmonary fibrosis (HCC)   . Interstitial lung disease (HCC)    Dr. Shelle Ironlance    Past Surgical History:  Procedure Laterality Date  . ANGIOPLASTY  1992  . CORONARY ANGIOPLASTY WITH STENT PLACEMENT  01/2001   RCA  . ESOPHAGOGASTRODUODENOSCOPY  01/2001   bleeding  . KNEE SURGERY  1978   Right  . URETERAL STENT PLACEMENT  01/2009   Dr. Achilles Dunkope    MEDICATIONS: I have reviewed all medications and confirmed regimen as documented  Social History   Social History  . Marital status: Married    Spouse name: N/A  . Number of children: 1  . 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 Smoking status: Former Smoker    Packs/day: 1.00    Years: 25.00    Types: Cigarettes    Quit date: 09/23/1990  . Smokeless tobacco: Never Used  . Alcohol use Yes     Comment: Social   . Drug use: No  . Sexual activity: Not on file   Other Topics Concern  . Not on file   Social History Narrative   No living will    No health care POA--but would request wife   Would accept resuscitation   No tube feeds if cognitively unaware    Family History  Problem Relation Age of Onset  . Coronary artery disease Father   . Diabetes Father   . Hypertension Father   . Cancer Father     prostate  . Hypertension Mother     ROS: No fever, myalgias/arthralgias, unexplained weight loss or weight gain No new focal weakness or sensory deficits No otalgia, hearing loss, visual changes, nasal and sinus symptoms, mouth and throat problems No neck pain or adenopathy No abdominal pain, N/V/D, diarrhea, change in bowel pattern No dysuria, change in urinary pattern   Vitals:   05/13/2016 1109 05/10/2016 1349 05/16/2016 1558 05/07/2016 1722  BP:  (!) 123/91 130/62   Pulse:  (!) 102 (!) 32   Resp:  16 20   Temp:   98.7 F (37.1 C)   TempSrc:   Oral   SpO2:  (!) 89% 99% 95%  Weight: 180 lb (81.6 kg)  166 lb 1.6 oz (75.3 kg)   Height: 6' (1.829 m)  6' (1.829 m)      EXAM:  Gen: Frail and fatigued appearing, dyspneic with minimal  exertion. Desaturates on [place] O2 HEENT: NCAT, sclera white, oropharynx normal Neck: Supple without LAN, thyromegaly, JVD Lungs: breath sounds: diminished, percussion: normal, diffuse crackles, no wheezes Cardiovascular: tachy, reg, no murmurs noted Abdomen: Soft, nontender, normal BS Ext: symmetric ankle and pedal edema, digital clubbing, acrocyanosis Neuro: CNs grossly intact, motor and sensory intact Skin: Limited exam, no lesions noted  DATA:   BMP Latest Ref Rng & Units 05/13/2016 04/30/2016 04/28/2016  Glucose 65 - 99 mg/dL 244(W) 102(V) 253(G)  BUN 6 - 20 mg/dL 10 17 8   Creatinine 0.61 - 1.24 mg/dL 6.44 0.34 7.42  Sodium 135 - 145 mmol/L 129(L) 129(L) 134(L)  Potassium 3.5 - 5.1 mmol/L 4.2 5.0 4.1  Chloride 101 - 111 mmol/L 91(L) 94(L) 97(L)  CO2  22 - 32 mmol/L 30 28 29   Calcium 8.9 - 10.3 mg/dL 9.3 9.4 9.4    CBC Latest Ref Rng & Units 04/29/2016 04/28/2016 03/25/2016  WBC 3.8 - 10.6 K/uL 15.7(H) 10.2 12.2(H)  Hemoglobin 13.0 - 18.0 g/dL 59.5 63.8 12.3(L)  Hematocrit 40.0 - 52.0 % 43.8 42.3 36.3(L)  Platelets 150 - 440 K/uL 237 216 180    CXR:  Severe diffuse interstitial disease  IMPRESSION:   1) End stage pulmonary fibrosis 2) Acute on chronic hypoxic respiratory failure - most likely this represents progression of underlying disease. He has been marginally responsive to corticosteroids previously. There is no convincing evidence of infection or pulmonary edema 3) Former smoker - no documented COPD. Previously on bronchodilator therapy without much discernible benefit 4) increasing LE edema - likely due to cor pulmonale 5) intractable dyspnea  PLAN:  1) Continue oxygen therapy to maintain SpO2 > 90% 2) Empiric nebulized steroids and bronchodilators 3) Systemic steroids - methylprednisolone 60 mg q 6 hrs 4) I discussed goals of care with pt and is wife. We discussed the end stage nature of his lung disease and the recent inexorable progression despite our efforts. I explained that mechanical ventilation at this stage of his illness would almost certainly not lead to a favorable outcome and would perhaps contribute unnecessarily to his suffering. He is extremely exhausted and has suffered substantially with dyspnea. His chief goal is to obtain some relief from this misery. Therefore, we have established the following strategy  - DNR/DNI  - PRN BIPAP as long as it is relieving more discomfort than it is causing  - Low dose PRN fentanyl patch  - Low dose PRN morphine     Bradley Fischer, MD PCCM service Mobile [phone] Pager [phone] 05/09/2016

## 2016-05-20 NOTE — ED Notes (Signed)
Patient transported to X-ray 

## 2016-05-20 NOTE — ED Notes (Signed)
Pt daughter Newton PiggVicki Pantoja [phone].

## 2016-05-20 NOTE — ED Provider Notes (Signed)
[facility]-EMERGENCY DEPARTMENT Provider Note   CSN: [id number] Arrival date & time: 05/14/2016  1040     History   Chief Complaint Chief Complaint  Patient presents with  . Shortness of Breath    HPI Bradley Rozelle. is a 65 y.o. male hx of CAD, IPF on chronic steroids, here with shortness of breath. He was admitted multiple times last month for pulmonary fibrosis. He saw Dr. Belia Heman from pulmonary a week ago and is currently on pulmonary rehab. Has been more short of breath at rehab and required 10 L [place] when he moves around. He is on 3 L [place] at baseline. He also some nonproductive cough as well. Denies fever and chills. Over the last week, he has shortness of breath with minimal exertion. He is scheduled to see pulmonology at [facility] for possible transplant next month   The history is provided by the patient.    Past Medical History:  Diagnosis Date  . Allergic rhinitis   . BPH (benign prostatic hypertrophy)    Dr. [name]  . CAD (coronary artery disease)    Dr. [name]  . Idiopathic pulmonary fibrosis (HCC)   . Interstitial lung disease (HCC)    Dr. [name]    Patient Active Problem List   Diagnosis Date Noted  . Acute on chronic respiratory failure (HCC) 04/28/2016  . Chronic respiratory failure with hypoxia (HCC) 04/07/2016  . COPD exacerbation (HCC) 03/24/2016  . Acute bronchitis 03/24/2016  . Acute respiratory failure with hypoxia (HCC) 03/24/2016  . Hyperglycemia 03/24/2016  . Low back pain 10/18/2015  . PSVT (paroxysmal supraventricular tachycardia) (HCC) 04/26/2015  . Abdominal aortic aneurysm (HCC) 03/03/2015  . Essential hypertension 03/03/2015  . Old MI (myocardial infarction) 05/18/2012  . Routine general medical examination at a health care facility 01/31/2012  . Cough 12/03/2011  . BPH (benign prostatic hypertrophy)   . ACTINIC KERATOSIS 11/21/2009  . Nonallergic rhinitis 05/10/2008  . Idiopathic pulmonary fibrosis (HCC) 01/13/2008  . HYPERLIPIDEMIA 05/07/2007  .  Coronary atherosclerosis of native coronary artery 05/07/2007    Past Surgical History:  Procedure Laterality Date  . ANGIOPLASTY  1992  . CORONARY ANGIOPLASTY WITH STENT PLACEMENT  01/2001   RCA  . ESOPHAGOGASTRODUODENOSCOPY  01/2001   bleeding  . KNEE SURGERY  1978   Right  . URETERAL STENT PLACEMENT  01/2009   Dr. [name]       Home Medications    Prior to Admission medications   Medication Sig Start Date End Date Taking? Authorizing Provider  albuterol (PROVENTIL HFA;VENTOLIN HFA) 108 (90 Base) MCG/ACT inhaler Inhale 2 puffs into the lungs every 6 (six) hours as needed for wheezing or shortness of breath. 04/30/16   Alford Highland, MD  aspirin EC 81 MG tablet Take 81 mg by mouth daily.    Historical Provider, MD  budesonide (PULMICORT) 0.25 MG/2ML nebulizer solution Take 2 mLs (0.25 mg total) by nebulization 2 (two) times daily. 05/09/16 05/09/17  Erin Fulling, MD  clonazePAM (KLONOPIN) 0.5 MG tablet Take 1 tablet (0.5 mg total) by mouth 2 (two) times daily as needed (anxiety). 04/30/16   Alford Highland, MD  CRESTOR 40 MG tablet TAKE 1 TABLET EVERY DAY 06/05/15   [name], MD  fluticasone [facility]) 50 MCG/ACT nasal spray USE TWO PUFFS INTO EACH NOSTRIL DAILY 01/13/15   [name], MD  guaiFENesin (MUCINEX) 600 MG 12 hr tablet Take 1 tablet (600 mg total) by mouth 2 (two) times daily. Patient taking differently: Take 600 mg by  mouth every morning.  03/26/16   Altamese Dilling, MD  guaiFENesin-codeine (ROBITUSSIN AC) 100-10 MG/5ML syrup Take 5 mLs by mouth 3 (three) times daily as needed for cough. 05/09/16   Erin Fulling, MD  levalbuterol (XOPENEX) 1.25 MG/0.5ML nebulizer solution Take 1.25 mg by nebulization every 6 (six) hours. 05/09/16   Erin Fulling, MD  metoprolol succinate (TOPROL-XL) 25 MG 24 hr tablet TAKE 1 TABLET EVERY DAY 03/14/16   [name], MD  nitroGLYCERIN (NITROSTAT) 0.4 MG SL tablet Place 1 tablet (0.4 mg total) under the tongue every 5 (five) minutes as  needed. For chest pain. May repeat 3 times 02/28/16   Lewayne Bunting, MD  OXYGEN Inhale 2 L/min into the lungs continuous. 2lpm 24/7 [facility]     Historical Provider, MD  Pirfenidone (ESBRIET) 267 MG CAPS Take 3 capsules by mouth 3 (three) times daily. 05/24/14   Barbaraann Share, MD  predniSONE (DELTASONE) 20 MG tablet Take 1 tablet (20 mg total) by mouth daily with breakfast. 05/09/16   Erin Fulling, MD  predniSONE (DELTASONE) 5 MG tablet 6 tabs po day1; 5 tabs po day2; 4 tabs po day3; 3 tabs po day4; 2 tabs po day5,6; 1 tab day7,8 04/30/16   Alford Highland, MD  silodosin (RAPAFLO) 8 MG CAPS capsule Take 8 mg by mouth daily as needed. 04/07/15   Historical Provider, MD  traMADol (ULTRAM) 50 MG tablet TAKE ONE TABLET EVERY SIX HOURS AS NEEDED FOR COUGH 11/07/15   Nyoka Cowden, MD  valsartan (DIOVAN) 80 MG tablet TAKE 1 TABLET EVERY DAY Patient taking differently: TAKE 1 TABLET BY MOUTH EVERY MORNING. 03/13/16   Lewayne Bunting, MD    Family History Family History  Problem Relation Age of Onset  . Coronary artery disease Father   . Diabetes Father   . Hypertension Father   . Cancer Father     prostate  . Hypertension Mother     Social History Social History  Substance Use Topics  . Smoking status: Former Smoker    Packs/day: 1.00    Years: 25.00    Types: Cigarettes    Quit date: 09/23/1990  . Smokeless tobacco: Never Used  . Alcohol use Yes     Comment: Social     Allergies   Atorvastatin and Doxycycline   Review of Systems Review of Systems  Respiratory: Positive for shortness of breath.   All other systems reviewed and are negative.    Physical Exam Updated Vital Signs BP 112/70 (BP Location: Left Arm)   Pulse 100   Temp 97.6 F (36.4 C) (Oral)   Resp (!) 22   Ht 6' (1.829 m)   Wt 180 lb (81.6 kg)   SpO2 97%   BMI 24.41 kg/m   Physical Exam  Constitutional: He is oriented to person, place, and time.  Chronically ill   HENT:  Head: Normocephalic.  Eyes: EOM are  normal. Pupils are equal, round, and reactive to light.  Neck: Normal range of motion. Neck supple.  Cardiovascular: Normal rate, regular rhythm and normal heart sounds.   Pulmonary/Chest:  + dry crackles. No obvious wheezing   Abdominal: Soft. Bowel sounds are normal. He exhibits no distension. There is no tenderness. There is no guarding.  Musculoskeletal: Normal range of motion. He exhibits no edema.  Neurological: He is alert and oriented to person, place, and time.  Skin: Skin is warm.  Psychiatric: He has a normal mood and affect.  Nursing note and vitals reviewed.  ED Treatments / Results  Labs (all labs ordered are listed, but only abnormal results are displayed) Labs Reviewed  BASIC METABOLIC PANEL - Abnormal; Notable for the following:       Result Value   Sodium 129 (*)    Chloride 91 (*)    Glucose, Bld 126 (*)    All other components within normal limits  CBC - Abnormal; Notable for the following:    WBC 15.7 (*)    All other components within normal limits  TROPONIN I  BRAIN NATRIURETIC PEPTIDE    EKG  EKG Interpretation  Date/Time:  Monday May 20 2016 11:23:04 EDT Ventricular Rate:  99 PR Interval:  130 QRS Duration: 92 QT Interval:  332 QTC Calculation: 426 R Axis:   4 Text Interpretation:  Normal sinus rhythm with sinus arrhythmia Left atrial enlargement Borderline ECG When compared with ECG of 28-Apr-2016 09:22, PREVIOUS ECG IS PRESENT ----------unconfirmed---------- Confirmed by OVERREAD, NOT ([id number]), editor PEARSON, BARBARA ([id number]) on 2016/07/24 11:41:05 AM      ED ECG REPORT I, Richardean Canalavid H Yao, the attending physician, personally viewed and interpreted this ECG.   Date: 02017/11/01  EKG Time: 11:23 am  Rate: 99  Rhythm: normal EKG, normal sinus rhythm  Axis: normal  Intervals:none  ST&T Change: nonspecific   Radiology Dg Chest 2 View  Result Date: 2016/07/24 CLINICAL DATA:  Shortness of breath.  Cough. EXAM: CHEST  2 VIEW COMPARISON:   Radiograph of April 29, 2016. CT scan of December 07, 2012. FINDINGS: Stable cardiomediastinal silhouette. No pneumothorax or pleural effusion is noted. Stable interstitial densities are noted throughout both lungs concerning for scarring or chronic interstitial inflammation. Superimposed edema or acute inflammation cannot be excluded. Bony thorax is unremarkable. IMPRESSION: Stable diffuse interstitial densities are noted throughout both lungs compared to prior exam concerning for scarring or chronic interstitial inflammation, but superimposed edema or acute inflammation cannot be excluded. Electronically Signed   By: Lupita RaiderJames  Green Jr, M.D.   On: 02017/11/01 12:36    Procedures Procedures (including critical care time)  Medications Ordered in ED Medications  methylPREDNISolone sodium succinate (SOLU-MEDROL) 125 mg/2 mL injection 125 mg (not administered)     Initial Impression / Assessment and Plan / ED Course  I have reviewed the triage vital signs and the nursing notes.  Pertinent labs & imaging results that were available during my care of the patient were reviewed by me and considered in my medical decision making (see chart for details).  Clinical Course   Bradley PeonFloyd G Prothero Jr. is a 65 y.o. male here with shortness of breath. Has been requiring more oxygen. Already on prednisone 20 mg daily. Will give IV steroids. Will get labs, CXR.   12:47 PM CXR showed pulmonary fibrosis. Labs showed WBC 16 but he is on steroids and has no fever. Given solumedrol. Since he required more oxygen, will admit.    Final Clinical Impressions(s) / ED Diagnoses   Final diagnoses:  None    New Prescriptions New Prescriptions   No medications on file     Charlynne Panderavid Hsienta Yao, MD December 08, 2015 1247

## 2016-05-21 ENCOUNTER — Other Ambulatory Visit: Payer: Self-pay | Admitting: Internal Medicine

## 2016-05-21 ENCOUNTER — Ambulatory Visit: Payer: PPO | Admitting: Internal Medicine

## 2016-05-21 ENCOUNTER — Other Ambulatory Visit: Payer: Self-pay

## 2016-05-21 DIAGNOSIS — E43 Unspecified severe protein-calorie malnutrition: Secondary | ICD-10-CM | POA: Insufficient documentation

## 2016-05-21 LAB — BASIC METABOLIC PANEL
Anion gap: 13 (ref 5–15)
BUN: 14 mg/dL (ref 6–20)
CHLORIDE: 88 mmol/L — AB (ref 101–111)
CO2: 24 mmol/L (ref 22–32)
Calcium: 9.2 mg/dL (ref 8.9–10.3)
Creatinine, Ser: 0.79 mg/dL (ref 0.61–1.24)
GFR calc Af Amer: >60 mL/min (ref >60)
GFR calc non Af Amer: >60 mL/min (ref >60)
Glucose, Bld: 125 mg/dL — ABNORMAL HIGH (ref 65–99)
POTASSIUM: 4.3 mmol/L (ref 3.5–5.1)
SODIUM: 125 mmol/L — AB (ref 135–145)

## 2016-05-21 LAB — CBC
HEMATOCRIT: 40.5 % (ref 40.0–52.0)
Hemoglobin: 14.1 g/dL (ref 13.0–18.0)
MCH: 32.2 pg (ref 26.0–34.0)
MCHC: 34.9 g/dL (ref 32.0–36.0)
MCV: 92.4 fL (ref 80.0–100.0)
Platelets: 228 10*3/uL (ref 150–440)
RBC: 4.39 MIL/uL — ABNORMAL LOW (ref 4.40–5.90)
RDW: 13.3 % (ref 11.5–14.5)
WBC: 10 10*3/uL (ref 3.8–10.6)

## 2016-05-21 MED ORDER — POLYETHYLENE GLYCOL 3350 17 G PO PACK: 17.0000 g | PACK | Freq: Every day | ORAL | Status: DC | PRN

## 2016-05-21 MED ORDER — DOCUSATE SODIUM 100 MG PO CAPS
100.0000 mg | ORAL_CAPSULE | Freq: Two times a day (BID) | ORAL | Status: DC
  Administered 2016-05-21 – 2016-05-23 (×5): 100 mg via ORAL
  Filled 2016-05-21 (×5): qty 1

## 2016-05-21 MED ORDER — ENSURE ENLIVE PO LIQD
237.0000 mL | Freq: Two times a day (BID) | ORAL | Status: DC
  Administered 2016-05-23: 237 mL via ORAL

## 2016-05-21 MED ORDER — HYDROCOD POLST-CPM POLST ER 10-8 MG/5ML PO SUER
5.0000 mL | Freq: Two times a day (BID) | ORAL | Status: DC
  Administered 2016-05-21 – 2016-05-23 (×6): 5 mL via ORAL
  Filled 2016-05-21 (×6): qty 5

## 2016-05-21 NOTE — Progress Notes (Signed)
SUBJ: Looks more comfortable. Doesn't feel much better. Still with significant cough throughout the day and night. Remains on NRB mask  Vitals:   05/21/16 0238 05/21/16 0424 05/21/16 0746 05/21/16 0806  BP:  107/72  113/74  Pulse:  98  (!) 102  Resp:  19  (!) 22  Temp:  97.5 F (36.4 C)  97.4 F (36.3 C)  TempSrc:  Oral  Oral  SpO2: 98% 99% 98% 99%  Weight:      Height:       No overt distress @ rest Appears fatigued No JVD noted Diffuse crackles Reg, no M NABS, soft BLE symmetric edema  BMP Latest Ref Rng & Units 05/21/2016 04/05/16 04/30/2016  Glucose 65 - 99 mg/dL 161(W125(H) 960(A126(H) 540(J118(H)  BUN 6 - 20 mg/dL 14 10 17   Creatinine 0.61 - 1.24 mg/dL 8.110.79 9.140.89 7.820.82  Sodium 135 - 145 mmol/L 125(L) 129(L) 129(L)  Potassium 3.5 - 5.1 mmol/L 4.3 4.2 5.0  Chloride 101 - 111 mmol/L 88(L) 91(L) 94(L)  CO2 22 - 32 mmol/L 24 30 28   Calcium 8.9 - 10.3 mg/dL 9.2 9.3 9.4   CBC Latest Ref Rng & Units 05/21/2016 04/05/16 04/28/2016  WBC 3.8 - 10.6 K/uL 10.0 15.7(H) 10.2  Hemoglobin 13.0 - 18.0 g/dL 95.614.1 21.314.8 08.614.6  Hematocrit 40.0 - 52.0 % 40.5 43.8 42.3  Platelets 150 - 440 K/uL 228 237 216   No new CXR  IMPRESSION:   1) End stage pulmonary fibrosis 2) Acute on chronic hypoxic respiratory failure 3) Former smoker - Previously on bronchodilator therapy without much discernible benefit 4) LE edema - likely due to cor pulmonale 5) intractable cough  PLAN:  1) Continue oxygen therapy to maintain SpO2 > 90%. Suggest HFNC 2) Cont empiric nebulized steroids and bronchodilators 3) Cont systemic steroids - will decrease dose to 80 mg BID 4) As discussed with Dr Nemiah CommanderKalisetti, scheduled antitussive (Tussionex) 5) Cont fentanyl patch and low dsoe PRN morphine 6) DNR/DNI remains appropriate                     Billy Fischeravid Sabreen Kitchen, MD PCCM service Mobile [phone] Pager [phone] 05/21/2016

## 2016-05-21 NOTE — Care Management Important Message (Signed)
Important Message  Patient Details  Name: Bradley PeonFloyd G Teigen Jr. MRN: [id number] Date of Birth: 1951/01/15   Medicare Important Message Given:  Yes    Gwenette GreetBrenda S Mate Alegria, RN 05/21/2016, 8:42 AM

## 2016-05-21 NOTE — Progress Notes (Addendum)
[facility] - [place] [facility]   PATIENT NAME: Bradley May    MR#:  [id number]  DATE OF BIRTH:  Apr 23, 1951  SUBJECTIVE:  CHIEF COMPLAINT:   Chief Complaint  Patient presents with  . Shortness of Breath   - Progressive pulmonary fibrosis, symptomatic dyspnea, hypoxia - on NRB mask this AM- complains of cough and shortness of breath  REVIEW OF SYSTEMS:  Review of Systems  Constitutional: Positive for malaise/fatigue. Negative for chills and fever.  HENT: Negative for ear discharge, ear pain and nosebleeds.   Eyes: Negative for blurred vision and double vision.  Respiratory: Positive for cough and shortness of breath. Negative for wheezing.   Cardiovascular: Negative for chest pain, palpitations and leg swelling.  Gastrointestinal: Negative for abdominal pain, constipation, diarrhea, nausea and vomiting.  Genitourinary: Negative for dysuria.  Musculoskeletal: Negative for myalgias.  Neurological: Negative for dizziness, sensory change, speech change, focal weakness, seizures and headaches.  Psychiatric/Behavioral: Negative for depression.    DRUG ALLERGIES:   Allergies  Allergen Reactions  . Atorvastatin     REACTION: Headache  . Doxycycline     REACTION: Nausea    VITALS:  Blood pressure 113/74, pulse (!) 102, temperature 97.4 F (36.3 C), temperature source Oral, resp. rate (!) 22, height 6' (1.829 m), weight 73.5 kg (162 lb), SpO2 96 %.  PHYSICAL EXAMINATION:  Physical Exam  GENERAL:  65 y.o.-year-old patient lying in the bed with no acute distress. On non rebreather mask- dyspneic at rest EYES: Pupils equal, round, reactive to light and accommodation. No scleral icterus. Extraocular muscles intact.  HEENT: Head atraumatic, normocephalic. Oropharynx and nasopharynx clear.  NECK:  Supple, no jugular venous distention. No thyroid enlargement, no tenderness.  LUNGS: diffuse crackles all over the lung fields, posteriorly - using accessory  muscles to breath with minimal exertion - no wheezing CARDIOVASCULAR: S1, S2 normal. No murmurs, rubs, or gallops.  ABDOMEN: Soft, nontender, nondistended. Bowel sounds present. No organomegaly or mass.  EXTREMITIES: No pedal edema, cyanosis, or clubbing.  NEUROLOGIC: Cranial nerves II through XII are intact. Muscle strength 5/5 in all extremities. Sensation intact. Gait not checked.  PSYCHIATRIC: The patient is alert and oriented x 3.  SKIN: No obvious rash, lesion, or ulcer.    LABORATORY PANEL:   CBC  Recent Labs Lab 05/21/16 0310  WBC 10.0  HGB 14.1  HCT 40.5  PLT 228   ------------------------------------------------------------------------------------------------------------------  Chemistries   Recent Labs Lab 05/21/16 0310  NA 125*  K 4.3  CL 88*  CO2 24  GLUCOSE 125*  BUN 14  CREATININE 0.79  CALCIUM 9.2   ------------------------------------------------------------------------------------------------------------------  Cardiac Enzymes  Recent Labs Lab 04/27/2016 1112  TROPONINI <0.03   ------------------------------------------------------------------------------------------------------------------  RADIOLOGY:  Dg Chest 2 View  Result Date: 05/11/2016 CLINICAL DATA:  Shortness of breath.  Cough. EXAM: CHEST  2 VIEW COMPARISON:  Radiograph of April 29, 2016. CT scan of December 07, 2012. FINDINGS: Stable cardiomediastinal silhouette. No pneumothorax or pleural effusion is noted. Stable interstitial densities are noted throughout both lungs concerning for scarring or chronic interstitial inflammation. Superimposed edema or acute inflammation cannot be excluded. Bony thorax is unremarkable. IMPRESSION: Stable diffuse interstitial densities are noted throughout both lungs compared to prior exam concerning for scarring or chronic interstitial inflammation, but superimposed edema or acute inflammation cannot be excluded. Electronically Signed   By: Lupita RaiderJames  Green Jr,  M.D.   On: 05/02/2016 12:36    EKG:   Orders placed or performed during the hospital  encounter of 05/15/2016  . ED EKG  . ED EKG  . EKG 12-Lead  . EKG 12-Lead    ASSESSMENT AND PLAN:   65 year old male with past medical history significant for progressing idiopathic pulmonary fibrosis, hypertension, CAD admitted to the hospital secondary to shortness of breath.  #1 acute on chronic exacerbation of IPF-appreciate pulmonary consult. -On high-dose steroids at this time. Patient awaiting appointment at [facility] for considering lung transplant. -Changed to high flow nasal cannula. -Symptomatic treatment for cough -Fentanyl patch added for breathing. Also on morphine when necessary. Continue pirfenidone for IPF  #2 CAD-stable. Continue cardiac medications.  #3 hypertension-on metoprolol, Avapro  #4 depression-continue trazodone. 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 Management plans discussed with the patient, family and they are in agreement.  CODE STATUS: DNR per ICU attending's discussion  TOTAL TIME TAKING CARE OF THIS PATIENT: 37 minutes.   POSSIBLE D/C IN 2 DAYS, DEPENDING ON CLINICAL CONDITION.   Enid Baas M.D on 05/21/2016 at 2:42 PM  Between 7am to 6pm - Pager - [phone]  After 6pm go to [email or web address] - Social research officer, government  [facility]  Office  [phone]  CC: Primary care physician; [name], MD

## 2016-05-21 NOTE — Telephone Encounter (Signed)
Received refill request from [facility] for clonazepam, per [facility]K patient's pcp will need to address this. rx request has ben denied.  patient's wife Bradley May (dpr) is made aware of this recommendation and voiced understanding. Nothing further needed.

## 2016-05-21 NOTE — Progress Notes (Signed)
Initial Nutrition Assessment  DOCUMENTATION CODES:   Severe malnutrition in context of acute illness/injury, likely chronic illness  INTERVENTION:  -Recommend regular diet secondary to poor po intake and wt loss -Recommend Ensure Enlive po BID, each supplement provides 350 kcal and 20 grams of protein -Discussed importance of high calorie high protein foods and small frequent meals secondary to wt loss.   NUTRITION DIAGNOSIS:   Malnutrition related to acute illness as evidenced by percent weight loss, energy intake < or equal to 50% for > or equal to 5 days.    GOAL:   Patient will meet greater than or equal to 90% of their needs    MONITOR:   PO intake, Supplement acceptance, Weight trends  REASON FOR ASSESSMENT:   Malnutrition Screening Tool    ASSESSMENT:      Pt admitted with endstage pulmonary fibrosis, acute on chronic hypoxic respiratory failure, cough on NRB mask  Past Medical History:  Diagnosis Date  . Allergic rhinitis   . BPH (benign prostatic hypertrophy)    Dr. Achilles Dunkope  . CAD (coronary artery disease)    Dr. Daleen SquibbWall  . Idiopathic pulmonary fibrosis (HCC)   . Interstitial lung disease (HCC)    Dr. Shelle Ironlance   Pt reports appetite has been poor secondary to shortness of breath for several weeks/months. Typically gives out midway through the meal  Medications reviewed: colace  Labs reviewed: Na 125, glucose 125  Nutrition-Focused physical exam completed. Findings are mild/moderate fat depletion, mild/moderate muscle depletion, and no edema.     Diet Order:  Diet Heart Room service appropriate? Yes; Fluid consistency: Thin  Skin:  Reviewed, no issues  Last BM:  8/28  Height:   Ht Readings from Last 1 Encounters:  05/21/16 6' (1.829 m)    Weight: 11% wt loss in the last month (182 pounds 8/2). Pt reports typical wt of 180 pounds. This writer weighed pt in bed during visit.  Wt Readings from Last 1 Encounters:  05/21/16 162 lb (73.5 kg)     Ideal Body Weight:     BMI:  Body mass index is 21.97 kg/m.  Estimated Nutritional Needs:   Kcal:  2250-2625 kcals/d  Protein:  90-113 g/d  Fluid:  >/= 227000ml/d  EDUCATION NEEDS:   Education needs addressed  Markiesha Delia B. Freida BusmanAllen, RD, LDN [phone] (pager) Weekend/On-Call pager [phone])

## 2016-05-22 ENCOUNTER — Telehealth: Payer: Self-pay | Admitting: Internal Medicine

## 2016-05-22 ENCOUNTER — Ambulatory Visit: Payer: PPO

## 2016-05-22 ENCOUNTER — Other Ambulatory Visit: Payer: Self-pay

## 2016-05-22 ENCOUNTER — Encounter: Payer: Self-pay | Admitting: Respiratory Therapy

## 2016-05-22 LAB — BASIC METABOLIC PANEL
Anion gap: 8 (ref 5–15)
BUN: 21 mg/dL — AB (ref 6–20)
CHLORIDE: 90 mmol/L — AB (ref 101–111)
CO2: 29 mmol/L (ref 22–32)
Calcium: 9 mg/dL (ref 8.9–10.3)
Creatinine, Ser: 0.86 mg/dL (ref 0.61–1.24)
GFR calc Af Amer: >60 mL/min (ref >60)
GFR calc non Af Amer: >60 mL/min (ref >60)
GLUCOSE: 130 mg/dL — AB (ref 65–99)
POTASSIUM: 4.6 mmol/L (ref 3.5–5.1)
Sodium: 127 mmol/L — ABNORMAL LOW (ref 135–145)

## 2016-05-22 MED ORDER — NYSTATIN 100000 UNIT/ML MT SUSP
5.0000 mL | Freq: Four times a day (QID) | OROMUCOSAL | Status: DC
  Administered 2016-05-22 – 2016-05-23 (×4): 500000 [IU] via ORAL
  Filled 2016-05-22 (×4): qty 5

## 2016-05-22 MED ORDER — METHYLPREDNISOLONE SODIUM SUCC 125 MG IJ SOLR
80.0000 mg | Freq: Two times a day (BID) | INTRAMUSCULAR | Status: DC
  Administered 2016-05-22 – 2016-05-23 (×2): 80 mg via INTRAVENOUS
  Filled 2016-05-22 (×2): qty 2

## 2016-05-22 NOTE — Telephone Encounter (Signed)
Were you given FMLA paperwork?

## 2016-05-22 NOTE — Progress Notes (Signed)
Dr Bard Herbertsimmonds and  Dr Amado Coegouru in to see pt today. Plan for  Cm to look into possibility  Of  Higher o2 therapt at home.   Na remains 127. Plan to  Decrease fluid intake to 1500 / 24 hrs. Pt instructed and aware.  Pt  Cot on hfnc and tol well. Feeling better. Steroid change  Iv  To q 12 hrs now.

## 2016-05-22 NOTE — Telephone Encounter (Signed)
His daughter just came back by [facility] the [facility] stating it is for Intermittent FMLA. He is in hospital at this time for his IPF. It is a pulmonary issue. Do you want me to see if DS will fill it out since he has seen him everyday this week?

## 2016-05-22 NOTE — Telephone Encounter (Signed)
Pt daughter, Lynden AngVicky, calling regarding FMLA paperwork that was dropped off on 8/28. She would like to know the status. Please call.

## 2016-05-22 NOTE — Progress Notes (Signed)
SUBJ: More comfortable. Slept better last night. Cough persists but is improved. Tolerating HFNC @ 60%  Vitals:   05/22/16 0208 05/22/16 0417 05/22/16 0805 05/22/16 0811  BP:  126/74 103/61   Pulse:  97 (!) 111   Resp:  16 20   Temp:  97.7 F (36.5 C) 97.7 F (36.5 C)   TempSrc:  Oral Oral   SpO2: 97% 95% 98% 93%  Weight:      Height:       No overt distress @ rest Appears fatigued No JVD noted Diffuse B crackles Reg, no M NABS, soft No LE edema  BMP Latest Ref Rng & Units 05/22/2016 05/21/2016 12-Nov-2015  Glucose 65 - 99 mg/dL 409(W130(H) 119(J125(H) 478(G126(H)  BUN 6 - 20 mg/dL 95(A21(H) 14 10  Creatinine 0.61 - 1.24 mg/dL 2.130.86 0.860.79 5.780.89  Sodium 135 - 145 mmol/L 127(L) 125(L) 129(L)  Potassium 3.5 - 5.1 mmol/L 4.6 4.3 4.2  Chloride 101 - 111 mmol/L 90(L) 88(L) 91(L)  CO2 22 - 32 mmol/L 29 24 30   Calcium 8.9 - 10.3 mg/dL 9.0 9.2 9.3   CBC Latest Ref Rng & Units 05/21/2016 12-Nov-2015 04/28/2016  WBC 3.8 - 10.6 K/uL 10.0 15.7(H) 10.2  Hemoglobin 13.0 - 18.0 g/dL 46.914.1 62.914.8 52.814.6  Hematocrit 40.0 - 52.0 % 40.5 43.8 42.3  Platelets 150 - 440 K/uL 228 237 216   No new CXR  IMPRESSION:   1) End stage pulmonary fibrosis 2) Acute on chronic hypoxic respiratory failure 3) Former smoker - Previously on bronchodilator therapy without much discernible benefit 4) LE edema - likely due to cor pulmonale 5) intractable cough  PLAN:  1) Continue oxygen therapy to maintain SpO2 > 90%. Suggest HFNC 2) Cont empiric nebulized steroids and bronchodilators 3) Cont systemic steroids - will decrease dose to 80 mg BID 4) Cont scheduled antitussive (Tussionex) 5) Cont fentanyl patch and low dsoe PRN morphine 6) will try to arrange HFNC for home after discharge if possible                     Billy Fischeravid Daneil Beem, MD PCCM service Mobile [phone] Pager [phone] 05/22/2016

## 2016-05-22 NOTE — Telephone Encounter (Signed)
Send to primary, I have no idea about that

## 2016-05-22 NOTE — Progress Notes (Signed)
[facility] [place] [facility]   PATIENT NAME: Ranny Wiebelhaus    MR#:  [id number]  DATE OF BIRTH:  23-Oct-1950  SUBJECTIVE:  CHIEF COMPLAINT:   Chief Complaint  Patient presents with  . Shortness of Breath   - Progressive pulmonary fibrosis, symptomatic dyspnea, hypoxia - on -Oxygen via nasal cannula 60% FiO2 and 45 L of oxygen- shortness of breath is better  REVIEW OF SYSTEMS:  Review of Systems  Constitutional: Positive for malaise/fatigue. Negative for chills and fever.  HENT: Negative for ear discharge, ear pain and nosebleeds.   Eyes: Negative for blurred vision and double vision.  Respiratory: Positive for cough and shortness of breath. Negative for wheezing.   Cardiovascular: Negative for chest pain, palpitations and leg swelling.  Gastrointestinal: Negative for abdominal pain, constipation, diarrhea, nausea and vomiting.  Genitourinary: Negative for dysuria.  Musculoskeletal: Negative for myalgias.  Neurological: Negative for dizziness, sensory change, speech change, focal weakness, seizures and headaches.  Psychiatric/Behavioral: Negative for depression.    DRUG ALLERGIES:   Allergies  Allergen Reactions  . Atorvastatin     REACTION: Headache  . Doxycycline     REACTION: Nausea    VITALS:  Blood pressure 95/61, pulse (!) 103, temperature 97.8 F (36.6 C), temperature source Oral, resp. rate (!) 22, height 6' (1.829 m), weight 73.5 kg (162 lb), SpO2 100 %.  PHYSICAL EXAMINATION:  Physical Exam  GENERAL:  65 y.o.-year-old patient lying in the bed with no acute distress. On non rebreather mask- dyspneic at rest EYES: Pupils equal, round, reactive to light and accommodation. No scleral icterus. Extraocular muscles intact.  HEENT: Head atraumatic, normocephalic. Oropharynx and nasopharynx clear.  NECK:  Supple, no jugular venous distention. No thyroid enlargement, no tenderness.  LUNGS: diffuse crackles all over the lung fields,  posteriorly - using accessory muscles to breath with minimal exertion - no wheezing CARDIOVASCULAR: S1, S2 normal. No murmurs, rubs, or gallops.  ABDOMEN: Soft, nontender, nondistended. Bowel sounds present. No organomegaly or mass.  EXTREMITIES: No pedal edema, cyanosis, or clubbing.  NEUROLOGIC: Cranial nerves II through XII are intact. Muscle strength 5/5 in all extremities. Sensation intact. Gait not checked.  PSYCHIATRIC: The patient is alert and oriented x 3.  SKIN: No obvious rash, lesion, or ulcer.    LABORATORY PANEL:   CBC  Recent Labs Lab 05/21/16 0310  WBC 10.0  HGB 14.1  HCT 40.5  PLT 228   ------------------------------------------------------------------------------------------------------------------  Chemistries   Recent Labs Lab 05/22/16 0559  NA 127*  K 4.6  CL 90*  CO2 29  GLUCOSE 130*  BUN 21*  CREATININE 0.86  CALCIUM 9.0   ------------------------------------------------------------------------------------------------------------------  Cardiac Enzymes  Recent Labs Lab 04/30/2016 1112  TROPONINI <0.03   ------------------------------------------------------------------------------------------------------------------  RADIOLOGY:  No results found.  EKG:   Orders placed or performed during the hospital encounter of 05/21/2016  . ED EKG  . ED EKG  . EKG 12-Lead  . EKG 12-Lead    ASSESSMENT AND PLAN:   65 year old male with past medical history significant for progressing idiopathic pulmonary fibrosis, hypertension, CAD admitted to the hospital secondary to shortness of breath.  #1 acute on chronic exacerbation of IPF-appreciate pulmonary consult. -Currently on high flow oxygen -On high-dose steroids at this time. Patient awaiting appointment at [facility] for considering lung transplant. -Symptomatic treatment for cough -Fentanyl patch added  Also on morphine when necessary. Continue pirfenidone for IPF  #2 CAD-stable. Continue cardiac  medications.  #3 hypertension-on metoprolol, Avapro  #4 depression-continue  trazodone. 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 Management plans discussed with the patient, family and they are in agreement.  CODE STATUS: DNR per ICU attending's discussion  TOTAL TIME TAKING CARE OF THIS PATIENT: 35 minutes.   POSSIBLE D/C IN 2 DAYS, DEPENDING ON CLINICAL CONDITION.   Ramonita LabGouru, Cerise Lieber M.D on 05/22/2016 at 3:22 PM  Between 7am to 6pm - Pager - [phone]  After 6pm go to [email or web address] - Social research officer, governmentpassword EPAS [facility]  [facility]  Office  [phone]  CC: Primary care physician; Tillman Abideichard Letvak, MD

## 2016-05-23 ENCOUNTER — Inpatient Hospital Stay: Payer: PPO

## 2016-05-23 ENCOUNTER — Other Ambulatory Visit: Payer: Self-pay

## 2016-05-23 DIAGNOSIS — J9383 Other pneumothorax: Secondary | ICD-10-CM

## 2016-05-23 DIAGNOSIS — R221 Localized swelling, mass and lump, neck: Secondary | ICD-10-CM

## 2016-05-23 DIAGNOSIS — R220 Localized swelling, mass and lump, head: Secondary | ICD-10-CM

## 2016-05-23 LAB — BASIC METABOLIC PANEL
Anion gap: 8 (ref 5–15)
BUN: 26 mg/dL — AB (ref 6–20)
CHLORIDE: 93 mmol/L — AB (ref 101–111)
CO2: 28 mmol/L (ref 22–32)
Calcium: 9.2 mg/dL (ref 8.9–10.3)
Creatinine, Ser: 0.84 mg/dL (ref 0.61–1.24)
GFR calc Af Amer: >60 mL/min (ref >60)
GFR calc non Af Amer: >60 mL/min (ref >60)
Glucose, Bld: 118 mg/dL — ABNORMAL HIGH (ref 65–99)
POTASSIUM: 4.9 mmol/L (ref 3.5–5.1)
SODIUM: 129 mmol/L — AB (ref 135–145)

## 2016-05-23 MED ORDER — IOPAMIDOL (ISOVUE-300) INJECTION 61%
75.0000 mL | Freq: Once | INTRAVENOUS | Status: AC | PRN
  Administered 2016-05-23: 13:00:00 75 mL via INTRAVENOUS

## 2016-05-23 MED ORDER — BENZONATATE 100 MG PO CAPS
200.0000 mg | ORAL_CAPSULE | Freq: Three times a day (TID) | ORAL | Status: DC | PRN
  Administered 2016-05-23: 200 mg via ORAL
  Filled 2016-05-23: qty 2

## 2016-05-23 MED ORDER — FLUCONAZOLE IN SODIUM CHLORIDE 200-0.9 MG/100ML-% IV SOLN
200.0000 mg | INTRAVENOUS | Status: DC
  Administered 2016-05-23: 200 mg via INTRAVENOUS
  Filled 2016-05-23 (×2): qty 100

## 2016-05-23 MED ORDER — LORAZEPAM 2 MG/ML IJ SOLN
0.5000 mg | INTRAMUSCULAR | Status: DC | PRN
Start: 2016-05-23 — End: 2016-05-24
  Administered 2016-05-23: 0.5 mg via INTRAVENOUS
  Administered 2016-05-24 (×2): 1 mg via INTRAVENOUS
  Filled 2016-05-23 (×3): qty 1

## 2016-05-23 MED ORDER — MIDAZOLAM HCL 2 MG/2ML IJ SOLN
INTRAMUSCULAR | Status: AC
  Filled 2016-05-23: qty 4

## 2016-05-23 MED ORDER — MIDAZOLAM HCL 2 MG/2ML IJ SOLN: 4.0000 mg | Freq: Once | INTRAMUSCULAR | Status: DC

## 2016-05-23 MED ORDER — CLONAZEPAM 0.5 MG PO TABS: 0.5000 mg | ORAL_TABLET | Freq: Two times a day (BID) | ORAL | 0 refills | Status: AC | PRN

## 2016-05-23 MED ORDER — TRAZODONE HCL 50 MG PO TABS
100.0000 mg | ORAL_TABLET | Freq: Every evening | ORAL | Status: DC | PRN
  Administered 2016-05-23: 100 mg via ORAL
  Filled 2016-05-23: qty 2

## 2016-05-23 MED ORDER — DIPHENHYDRAMINE HCL 25 MG PO CAPS: 25.0000 mg | ORAL_CAPSULE | Freq: Four times a day (QID) | ORAL | Status: DC | PRN

## 2016-05-23 MED ORDER — FENTANYL 50 MCG/HR TD PT72: 75.0000 ug | MEDICATED_PATCH | TRANSDERMAL | Status: DC

## 2016-05-23 MED ORDER — ALPRAZOLAM 0.5 MG PO TABS
0.5000 mg | ORAL_TABLET | Freq: Three times a day (TID) | ORAL | Status: DC | PRN
  Administered 2016-05-23: 0.5 mg via ORAL
  Filled 2016-05-23 (×2): qty 1

## 2016-05-23 MED ORDER — FENTANYL CITRATE (PF) 100 MCG/2ML IJ SOLN
25.0000 ug | INTRAMUSCULAR | Status: DC | PRN
  Administered 2016-05-23 – 2016-05-24 (×6): 50 ug via INTRAVENOUS
  Filled 2016-05-23 (×8): qty 2

## 2016-05-23 MED ORDER — FENTANYL CITRATE (PF) 100 MCG/2ML IJ SOLN
INTRAMUSCULAR | Status: DC
Start: 2016-05-23 — End: 2016-05-23
  Filled 2016-05-23: qty 4

## 2016-05-23 MED ORDER — FENTANYL CITRATE (PF) 100 MCG/2ML IJ SOLN: 200.0000 ug | Freq: Once | INTRAMUSCULAR | Status: DC

## 2016-05-23 MED ORDER — FENTANYL 50 MCG/HR TD PT72
75.0000 ug | MEDICATED_PATCH | TRANSDERMAL | Status: DC
  Administered 2016-05-23: 75 ug via TRANSDERMAL
  Filled 2016-05-23: qty 2

## 2016-05-23 NOTE — Progress Notes (Signed)
Patient is being made comfort care. Patient has requested his dog be brought in for visit. Patient wife called International aid/development workerveterinarian, spoke with Alcario DroughtErica, who confirmed dog is up to date on vaccinations. Reviewed pet policy with wife.

## 2016-05-23 NOTE — Telephone Encounter (Signed)
Approved: 30 x 0 

## 2016-05-23 NOTE — Care Management (Signed)
RNCM consult for home HF O2. HF O2 can be provided but only up to 10 Liters O2.

## 2016-05-23 NOTE — Telephone Encounter (Signed)
Verbal refill given to Amy at the pharmacy 

## 2016-05-23 NOTE — Progress Notes (Signed)
CSW is following this patient to determine patient's discharge needs.   Bradley May, MSW, LCSW, LCAS-A Clinical Social Worker [phone]

## 2016-05-23 NOTE — Progress Notes (Signed)
Chaplain rounded the unit to provide a compassionate presence and support for the patient.  Jefm PettyChaplain Amari Zagal [phone]

## 2016-05-23 NOTE — Progress Notes (Signed)
CT neck and chest revealed extensive subq emphysema and R>L pneumothorax. I transferred the pt to the ICU in anticipation of chest tube placement. I discussed the procedure with the pt and his wife. We considered the potential benefit of resolving the pneumothoraces and what we could realistically hope for if they were to resolve. I was clear that I think lung transplantation is no longer an option as it was highly unlikely that he would be considered a candidate even prior to these developments. With that the case, I suggested that what we could "fight for" would be a few weeks at home with Hospice and hopefully some meaningful time with his loved ones. He asked what the course of this would be if we did not place chest tubes(s). I explained that the pneumothoraces would likely progress and be the cause of his death. I offered a time frame of days, not weeks.  He responded that he is very tired and wished to discuss this with his wife. We all spoke together and ultimately with his daughter as well. He would prefer a palliative course and wishes to forgo chest tube placement. I am supportive of this decision.  IMPRESSION: Acute on chronic hypoxic respiratory failure End stage IPF with accelerated course over past couple of months Spontaneous pneumothorax with extensive neck and face subcutaneous emphysema Contralateral pneumothorax - unclear whether this is gas that dissected across or a synchronous L sided air leak    PLAN/REC: We will focus on palliation with fentanyl, oxygen and PRN lorazepam I have discontinued medications that are not focussed on comfort Will continue IV fluconazole as he has substantial thrush that might be contributing to mouth and throat pain Will leave him in ICU tonight His wife raises possibility of residential Hospice. I will speak with Lorinda CreedMary Larach, NP  CCM time: 45 mins The above time includes time spent in consultation with patient and/or family members and reviewing  care plan on multidisciplinary rounds  Bradley Fischeravid Seairra Otani, MD PCCM service Mobile [phone] Pager [phone]

## 2016-05-23 NOTE — Progress Notes (Signed)
Notified by Dr. Sung AmabileSimonds patient needed to be urgently be transferred to CCU due to bilateral pneomothoraces and chest tube placement.   Patient currently stable. [facility]. Wife updated. Transported to CCU with Gena Frayhris RN without complication.

## 2016-05-23 NOTE — Progress Notes (Signed)
PT Cancellation Note  Patient Details Name: Bradley PeonFloyd G Hypolite Jr. MRN: [id number] DOB: 11-Jul-1951   Cancelled Treatment:    Reason Eval/Treat Not Completed: Other (comment). Pt now with change in status and moved to CCU. Will need new PT orders to resume therapy. Will dc current orders.   Nadea Kirkland 05/23/2016, 2:00 PM  Elizabeth PalauStephanie Teagan Heidrick, PT, DPT [phone]

## 2016-05-23 NOTE — Progress Notes (Signed)
SUBJ: Cough persists in vigorous paroxysms. Now with neck and facial swelling. Remains on HFNC @ 60%  Vitals:   05/23/16 0000 05/23/16 0457 05/23/16 0751 05/23/16 0850  BP:  (!) 108/58    Pulse:  89  91  Resp:  15    Temp:  97.2 F (36.2 C)    TempSrc:      SpO2: 95% 92% 97%   Weight:      Height:       No overt distress @ rest Symmetric swelling of neck and face, no crepitations + oral thrush Diffuse B crackles Reg, no M NABS, soft No LE edema  BMP Latest Ref Rng & Units 05/23/2016 05/22/2016 05/21/2016  Glucose 65 - 99 mg/dL 865(H118(H) 846(N130(H) 629(B125(H)  BUN 6 - 20 mg/dL 28(U26(H) 13(K21(H) 14  Creatinine 0.61 - 1.24 mg/dL 4.400.84 1.020.86 7.250.79  Sodium 135 - 145 mmol/L 129(L) 127(L) 125(L)  Potassium 3.5 - 5.1 mmol/L 4.9 4.6 4.3  Chloride 101 - 111 mmol/L 93(L) 90(L) 88(L)  CO2 22 - 32 mmol/L 28 29 24   Calcium 8.9 - 10.3 mg/dL 9.2 9.0 9.2   CBC Latest Ref Rng & Units 05/21/2016 05/06/2016 04/28/2016  WBC 3.8 - 10.6 K/uL 10.0 15.7(H) 10.2  Hemoglobin 13.0 - 18.0 g/dL 36.614.1 44.014.8 34.714.6  Hematocrit 40.0 - 52.0 % 40.5 43.8 42.3  Platelets 150 - 440 K/uL 228 237 216   No new CXR  IMPRESSION:   1) End stage pulmonary fibrosis 2) Acute on chronic hypoxic respiratory failure 3) Former smoker - Previously on bronchodilator therapy without much discernible benefit 4) LE edema - likely due to cor pulmonale 5) intractable cough 6) face and neck swelling - concern for subq emphysema (though no crepitations). Less likely SVC obstruction.   PLAN:  1) Continue oxygen therapy to maintain SpO2 > 90% 2) Cont empiric nebulized steroids and bronchodilators 3) Cont systemic steroids @ current dose of methylpred 80 mg BID 4) Cont scheduled antitussive (Tussionex) 5) Cont fentanyl patch and low dose PRN morphine 6) CT neck and chest with contrast ordered                     Billy Fischeravid Timonthy Hovater, MD PCCM service Mobile [phone] Pager [phone] 05/23/2016

## 2016-05-23 NOTE — Evaluation (Signed)
Physical Therapy Evaluation Patient Details Name: Bradley PeonFloyd G Marley Jr. MRN: [id number] DOB: 10-Oct-1950 Today's Date: 05/23/2016   History of Present Illness  Pt admitted for advanced pulm. fibrosis. Pt with history of CAD and prostatic hypertrophy. [facility] Pt with complaints of SOB symptoms.  Clinical Impression  Pt is a pleasant 65 year old male who was admitted for advanced pulm fibrosis. Pt performs bed mobility with independence and transfers/ambulation with cga and no AD. All mobility performed in room while on HFNC. O2 sats decreased to 88% with ambulation and 85% with there-ex. RN notified. Pt demonstrates deficits with strength/endurance/power. Pt with slightly increased 5 time sit<>stand test indicating decreased power and endurance. Would benefit from skilled PT to address above deficits and promote optimal return to PLOF. Recommend transfer to pulm rehab and then additional PT if needed.      Follow Up Recommendations  (pulmonary rehab-[facility])    Equipment Recommendations  None recommended by PT    Recommendations for Other Services       Precautions / Restrictions Precautions Precautions: Fall Restrictions Weight Bearing Restrictions: No      Mobility  Bed Mobility Overal bed mobility: Independent             General bed mobility comments: safe technique  Transfers Overall transfer level: Needs assistance Equipment used: None Transfers: Sit to/from Stand Sit to Stand: Min guard         General transfer comment: safe technique performed. Pt with slight unsteadiness upon initial standing able to self correct with cues.  Ambulation/Gait Ambulation/Gait assistance: Min guard Ambulation Distance (Feet): 100 Feet Assistive device: None Gait Pattern/deviations: Step-through pattern     General Gait Details: ambulated using no AD with cga. Pt perform pursed lip breathing and able to maintain O2  sats at 88% on HFNC. No fatigue present. No LOB noted, however slight unsteadiness noted  Stairs            Wheelchair Mobility    Modified Rankin (Stroke Patients Only)       Balance Overall balance assessment: Modified Independent                                           Pertinent Vitals/Pain Pain Assessment: No/denies pain    Home Living Family/patient expects to be discharged to:: Private residence Living Arrangements: Spouse/significant other Available Help at Discharge: Family Type of Home: House Home Access: Stairs to enter Entrance Stairs-Rails: Can reach both Entrance Stairs-Number of Steps: 3 Home Layout: One level Home Equipment: None      Prior Function Level of Independence: Independent         Comments: household ambulator secondary to poor O2 levels     Hand Dominance        Extremity/Trunk Assessment   Upper Extremity Assessment: Overall WFL for tasks assessed           Lower Extremity Assessment: Generalized weakness (B LE grossly 4/5)         Communication   Communication: No difficulties  Cognition Arousal/Alertness: Awake/alert Behavior During Therapy: WFL for tasks assessed/performed Overall Cognitive Status: Within Functional Limits for tasks assessed                      General Comments      Exercises Other Exercises Other Exercises: Seated/standing  ther-ex performed including B LE LAQ, alt. marching and sit/stand training. Pt able to performed ther-ex x 10 reps with supervision. Pt able to complete 5 time sit<>stand in 17 seconds.      Assessment/Plan    PT Assessment Patient needs continued PT services  PT Diagnosis Difficulty walking;Generalized weakness   PT Problem List Decreased strength;Decreased activity tolerance;Decreased mobility;Cardiopulmonary status limiting activity  PT Treatment Interventions Gait training;DME instruction;Therapeutic exercise   PT Goals (Current  goals can be found in the Care Plan section) Acute Rehab PT Goals Patient Stated Goal: to go home PT Goal Formulation: With patient Time For Goal Achievement: 06/06/16 Potential to Achieve Goals: Good    Frequency Min 2X/week   Barriers to discharge        Co-evaluation               End of Session Equipment Utilized During Treatment: Gait belt;Oxygen Activity Tolerance: Patient tolerated treatment well Patient left: in bed;with family/visitor present Nurse Communication: Mobility status         Time: 0950-1009 PT Time Calculation (min) (ACUTE ONLY): 19 min   Charges:   PT Evaluation $PT Eval Moderate Complexity: 1 Procedure PT Treatments $Therapeutic Exercise: 8-22 mins   PT G Codes:        Ramon Zanders 16-Jun-2016, 11:46 AM  Elizabeth Palau, PT, DPT [phone]

## 2016-05-23 NOTE — Progress Notes (Signed)
[facility] - [place] [facility]   PATIENT NAME: Bradley May    MR#:  [id number]  DATE OF BIRTH:  03-Feb-1951  SUBJECTIVE:  CHIEF COMPLAINT:   Chief Complaint  Patient presents with  . Shortness of Breath   - Progressive pulmonary fibrosis, symptomatic dyspnea, hypoxia - on -Oxygen via nasal cannula 60% FiO2 and 45 L of oxygen-  -Today patient is reporting swelling of his neck and feels scratchy in his throat but denies any difficulty with swallowing  REVIEW OF SYSTEMS:  Review of Systems  Constitutional: Positive for malaise/fatigue. Negative for chills and fever.  HENT: Negative for ear discharge, ear pain and nosebleeds.   Eyes: Negative for blurred vision and double vision.  Respiratory: Positive for cough and shortness of breath. Negative for wheezing.   Cardiovascular: Negative for chest pain, palpitations and leg swelling.  Gastrointestinal: Negative for abdominal pain, constipation, diarrhea, nausea and vomiting.  Genitourinary: Negative for dysuria.  Musculoskeletal: Negative for myalgias.  Neurological: Negative for dizziness, sensory change, speech change, focal weakness, seizures and headaches.  Psychiatric/Behavioral: Negative for depression.    DRUG ALLERGIES:   Allergies  Allergen Reactions  . Atorvastatin     REACTION: Headache  . Doxycycline     REACTION: Nausea    VITALS:  Blood pressure (!) 108/58, pulse 91, temperature 97.2 F (36.2 C), resp. rate 15, height 6' (1.829 m), weight 73.5 kg (162 lb), SpO2 97 %.  PHYSICAL EXAMINATION:  Physical Exam  GENERAL:  65 y.o.-year-old patient lying in the bed with no acute distress. On non rebreather mask- dyspneic at rest EYES: Pupils equal, round, reactive to light and accommodation. No scleral icterus. Extraocular muscles intact.  HEENT: Head atraumatic, normocephalic. Oropharynx and nasopharynxWith white plaques, uvula midline NECK:  Supple, no jugular venous distention. No  thyroid enlargement, no tenderness.  LUNGS: diffuse crackles all over the lung fields, posteriorly - using accessory muscles to breath with minimal exertion - no wheezing CARDIOVASCULAR: S1, S2 normal. No murmurs, rubs, or gallops.  ABDOMEN: Soft, nontender, nondistended. Bowel sounds present. No organomegaly or mass.  EXTREMITIES: No pedal edema, cyanosis, or clubbing.  NEUROLOGIC: Cranial nerves II through XII are intact. Muscle strength 5/5 in all extremities. Sensation intact. Gait not checked.  PSYCHIATRIC: The patient is alert and oriented x 3.  SKIN: No obvious rash, lesion, or ulcer.    LABORATORY PANEL:   CBC  Recent Labs Lab 05/21/16 0310  WBC 10.0  HGB 14.1  HCT 40.5  PLT 228   ------------------------------------------------------------------------------------------------------------------  Chemistries   Recent Labs Lab 05/23/16 0434  NA 129*  K 4.9  CL 93*  CO2 28  GLUCOSE 118*  BUN 26*  CREATININE 0.84  CALCIUM 9.2   ------------------------------------------------------------------------------------------------------------------  Cardiac Enzymes  Recent Labs Lab 04/27/2016 1112  TROPONINI <0.03   ------------------------------------------------------------------------------------------------------------------  RADIOLOGY:  Ct Soft Tissue Neck W Contrast  Addendum Date: 05/23/2016   ADDENDUM REPORT: 05/23/2016 14:18 ADDENDUM: Critical Value/emergent results were called by by the Radiologist Assistant to Dr. DAVID SIMONDS , at 1352 hours on 05/23/2016. who verbally acknowledged these results. Electronically Signed   By: H  Hall M.D.   On: 05/23/2016 14:18   Result Date: 05/23/2016 CLINICAL DATA:  65 year old male with increased cough and shortness of breath for 2 days. Head and neck swelling. Pulmonary fibrosis. EXAM: CT NECK WITH CONTRAST TECHNIQUE: Multidetector CT imaging of the neck was performed using the standard protocol following the bolus  administration of intravenous contrast. CONTRAST:  71mL ISOVUE-300  IOPAMIDOL (ISOVUE-300) INJECTION 61% in conjunction with contrast enhanced imaging of the chest reported separately. COMPARISON:  [facility] neck CT 12/07/2012. Chest CT today reported separately. FINDINGS: Large volume diffuse subcutaneous emphysema tracking throughout the bilateral deep soft tissue spaces of the upper chest and neck. See chest findings today reported separately. Pharynx and larynx: Laryngeal and pharyngeal soft tissue contours are within normal limits. Diffuse gas throughout the bilateral parapharyngeal and retropharyngeal spaces. Salivary glands: Negative sublingual space. Gas throughout the bilateral submandibular spaces. Diminutive common negative submandibular glands. Gas throughout both parotid spaces. Negative parotid glands. Thyroid: Negative. Gas surrounding the thyroid at the thoracic inlet. Lymph nodes: No cervical lymphadenopathy. Vascular: Gas throughout both carotid spaces. Suboptimal intravascular contrast. Grossly patent major vascular structures at the skullbase. Limited intracranial: Negative. Visualized orbits: Negative. Mastoids and visualized paranasal sinuses: Visualized paranasal sinuses and mastoids are stable and well pneumatized. Skeleton:  No acute osseous abnormality identified. Upper chest: Reported separately today. IMPRESSION: 1. Positive for Bilateral Pneumothorax with large volume subcutaneous emphysema tracking diffusely in the upper chest and neck. See Chest CT today reported separately. 2. No other acute findings in the neck. Electronically Signed: By: Odessa Fleming M.D. On: 05/23/2016 13:43   Ct Chest W Contrast  Result Date: 05/23/2016 CLINICAL DATA:  Increased shortness of breath and cough for 2 days, advanced pulmonary fibrosis, former smoker, coronary artery disease EXAM: CT CHEST WITH CONTRAST TECHNIQUE: Multidetector CT imaging of the chest was performed during intravenous contrast  administration. Sagittal and coronal MPR images reconstructed from axial data set. CONTRAST:  75mL ISOVUE-300 IOPAMIDOL (ISOVUE-300) INJECTION 61% IV COMPARISON:  12/07/2012 CT chest, chest radiograph 05/13/2016 bold FINDINGS: Cardiovascular: Atherosclerotic calcifications aorta and coronary arteries. Thoracic vascular structures grossly patent on nondedicated exam. Ascending thoracic aorta normal caliber. Minimal pneumopericardium. Mediastinum/Nodes: Extensive pneumomediastinum extending into the cervical region bilaterally. Extensive chest wall and subcutaneous emphysema bilaterally. Minimally enlarged RIGHT paratracheal node 13 mm short axis image 22. Upper normal size AP window node 10 mm short axis image 23. Trachea appears patent. Lungs/Pleura: BILATERAL pneumothoraces small on LEFT and moderate to large on RIGHT. No midline shift. Underlying pulmonary fibrosis. Superimposed atelectasis or less likely infiltrate seen in partially collapsed RIGHT upper lobe. No obvious pulmonary mass within limitations of exam. No pleural effusion. Upper Abdomen: Multiple LEFT renal cysts up to 6.8 cm greatest diameter image 70. Remaining visualized upper abdomen grossly unremarkable. No definite free intraperitoneal air at small visualized portion of upper abdomen. Musculoskeletal: No acute osseous findings. IMPRESSION: Extensive pulmonary fibrosis changes again identified with superimposed atelectasis or less likely infiltrate in RIGHT upper lobe. BILATERAL pneumothoraces RIGHT great than LEFT without midline shift. Extensive pneumomediastinumextending into cervical region associated with minimal pneumopericardium and chest wall/subcutaneous emphysema bilaterally. LEFT renal cysts. Aortic atherosclerosis with coronary arterial atherosclerotic calcification as well. Critical Value/emergent results were called by telephone at the time of interpretation on 05/23/2016 at 1357 hr to Dr. Billy Fischer , who verbally acknowledged  these results. Electronically Signed   By: Ulyses Southward M.D.   On: 05/23/2016 13:58    EKG:   Orders placed or performed during the hospital encounter of 05/21/2016  . ED EKG  . ED EKG  . EKG 12-Lead  . EKG 12-Lead    ASSESSMENT AND PLAN:   65 year old male with past medical history significant for progressing idiopathic pulmonary fibrosis, hypertension, CAD admitted to the hospital secondary to shortness of breath.  #Acute bilateral Facial and neck swelling secondary to Bilateral pneumothoraxes and subcutaneous emphysema  Transferred patient to intensive care unit Pulmonology is recommending chest tube placement at this time, patient and his wife are thinking about the procedure versus comfort care measures [facility]  # acute on chronic exacerbation of IPF-appreciate pulmonary consult. -Currently on high flow oxygen -On high-dose steroids at this time. [facility] -Symptomatic treatment for cough -Fentanyl patch added  Also on morphine when necessary. Continue pirfenidone for IPF  # CAD-stable. Continue cardiac medications.  #3 hypertension-on metoprolol, Avapro  # depression-continue trazodone. 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 Management plans discussed with the patient, family and they are in agreement.  CODE STATUS: DNR per ICU attending's discussion  TOTAL critical careTIME TAKING CARE OF THIS PATIENT: 35 minutes.   POSSIBLE D/C IN 2 DAYS, DEPENDING ON CLINICAL CONDITION.   Ramonita LabGouru, Madlynn Lundeen M.D  on 05/23/2016 at 2:45 PM  Between 7am to 6pm - Pager - [phone]  After 6pm go to [email or web address] - Social research officer, governmentpassword EPAS [facility]  [facility]  Office  [phone]  CC: Primary care physician; Tillman Abideichard Letvak, MD

## 2016-05-23 NOTE — Telephone Encounter (Signed)
Last filled 04-30-16 #30 Last OV 02-27-16 Next OV 03-05-17

## 2016-05-23 NOTE — Progress Notes (Signed)
Continues on High Flow N/C. O2 Sats decreased when coughing and activity. Tessalon pearls prn x's one with some relief. Morphine for dyspnea with relief. Continues on scheduled breathing treatments. Continue to monitor.

## 2016-05-24 ENCOUNTER — Ambulatory Visit: Payer: PPO

## 2016-05-24 LAB — GLUCOSE, CAPILLARY: Glucose-Capillary: 129 mg/dL — ABNORMAL HIGH (ref 65–99)

## 2016-05-24 MED ORDER — FENTANYL 2500MCG IN NS 250ML (10MCG/ML) PREMIX INFUSION
100.0000 ug/h | INTRAVENOUS | Status: DC
  Administered 2016-05-24: 100 ug/h via INTRAVENOUS
  Filled 2016-05-24: qty 250

## 2016-05-24 MED ORDER — FENTANYL CITRATE (PF) 100 MCG/2ML IJ SOLN
100.0000 ug | INTRAMUSCULAR | Status: AC
  Administered 2016-05-24: 100 ug via INTRAVENOUS

## 2016-05-24 DEATH — deceased

## 2016-05-25 LAB — CULTURE, GROUP A STREP (THRC)

## 2016-05-28 ENCOUNTER — Encounter: Payer: Self-pay | Admitting: Respiratory Therapy

## 2016-05-28 NOTE — Progress Notes (Signed)
Pulmonary Individual Treatment Plan  Patient Details  Name: Bradley May. MRN: [id number] Date of Birth: 11-24-1950 Referring Provider:   Flowsheet Row Pulmonary Rehab from 05/14/2016 in [facility] Cardiac and Pulmonary Rehab  Referring Provider  Mungal      Initial Encounter Date:  Flowsheet Row Pulmonary Rehab from 05/14/2016 in [facility] Cardiac and Pulmonary Rehab  Date  05/14/16  Referring Provider  Mungal      Visit Diagnosis: No diagnosis found.  Patient's Home Medications on Admission:  Current Outpatient Prescriptions:    albuterol (PROVENTIL HFA;VENTOLIN HFA) 108 (90 Base) MCG/ACT inhaler, Inhale 2 puffs into the lungs every 6 (six) hours as needed for wheezing or shortness of breath. (Patient not taking: Reported on 06-09-2016), Disp: 1 Inhaler, Rfl: 0   aspirin EC 81 MG tablet, Take 81 mg by mouth daily., Disp: , Rfl:    budesonide (PULMICORT) 0.25 MG/2ML nebulizer solution, Take 2 mLs (0.25 mg total) by nebulization 2 (two) times daily., Disp: 60 mL, Rfl: 4   clonazePAM (KLONOPIN) 0.5 MG tablet, Take 1 tablet (0.5 mg total) by mouth 2 (two) times daily as needed (anxiety)., Disp: 30 tablet, Rfl: 0   CRESTOR 40 MG tablet, TAKE 1 TABLET EVERY DAY, Disp: 30 tablet, Rfl: 11   famotidine (PEPCID) 20 MG tablet, Take 20 mg by mouth 2 (two) times daily., Disp: , Rfl:    fluticasone (FLONASE) 50 MCG/ACT nasal spray, USE TWO PUFFS INTO EACH NOSTRIL DAILY, Disp: 16 g, Rfl: 2   guaiFENesin (MUCINEX) 600 MG 12 hr tablet, Take 1 tablet (600 mg total) by mouth 2 (two) times daily. (Patient not taking: Reported on 06/09/16), Disp: 10 tablet, Rfl: 0   guaiFENesin-codeine (ROBITUSSIN AC) 100-10 MG/5ML syrup, Take 5 mLs by mouth 3 (three) times daily as needed for cough. (Patient not taking: Reported on 2016-06-09), Disp: 120 mL, Rfl: 0   levalbuterol (XOPENEX) 1.25 MG/0.5ML nebulizer solution, Take 1.25 mg by nebulization every 6 (six) hours., Disp: 120 vial, Rfl: 4   metoprolol  succinate (TOPROL-XL) 25 MG 24 hr tablet, TAKE 1 TABLET EVERY DAY, Disp: 90 tablet, Rfl: 3   nitroGLYCERIN (NITROSTAT) 0.4 MG SL tablet, Place 1 tablet (0.4 mg total) under the tongue every 5 (five) minutes as needed. For chest pain. May repeat 3 times, Disp: 25 tablet, Rfl: 1   OXYGEN, Inhale 2 L/min into the lungs continuous. 2lpm 24/7 [facility] , Disp: , Rfl:    Pirfenidone (ESBRIET) 267 MG CAPS, Take 3 capsules by mouth 3 (three) times daily., Disp: 270 capsule, Rfl: 5   predniSONE (DELTASONE) 20 MG tablet, Take 1 tablet (20 mg total) by mouth daily with breakfast. (Patient taking differently: Take 10 mg by mouth 2 (two) times daily. ), Disp: 30 tablet, Rfl: 5   predniSONE (DELTASONE) 5 MG tablet, 6 tabs po day1; 5 tabs po day2; 4 tabs po day3; 3 tabs po day4; 2 tabs po day5,6; 1 tab day7,8, Disp: 24 tablet, Rfl: 0   silodosin (RAPAFLO) 8 MG CAPS capsule, Take 8 mg by mouth every other day as needed. , Disp: , Rfl:    traMADol (ULTRAM) 50 MG tablet, TAKE ONE TABLET EVERY SIX HOURS AS NEEDED FOR COUGH, Disp: 60 tablet, Rfl: 5   valsartan (DIOVAN) 80 MG tablet, TAKE 1 TABLET EVERY DAY (Patient taking differently: TAKE 1 TABLET BY MOUTH EVERY MORNING.), Disp: 30 tablet, Rfl: 11  Past Medical History: Past Medical History:  Diagnosis Date   Allergic rhinitis    BPH (benign prostatic  hypertrophy)    Dr. [name]   CAD (coronary artery disease)    Dr. [name]   Idiopathic pulmonary fibrosis (HCC)    Interstitial lung disease (HCC)    Dr. [name]    Tobacco Use: History  Smoking Status   Former Smoker   Packs/day: 1.00   Years: 25.00   Types: Cigarettes   Quit date: 09/23/1990  Smokeless Tobacco   Never Used    Labs: Recent Review Flowsheet Data    Labs for ITP Cardiac and Pulmonary Rehab Latest Ref Rng & Units 02/03/2013 02/04/2014 02/15/2015 02/27/2016 03/24/2016   Cholestrol 0 - 200 mg/dL 073 710 626 948 -   LDLCALC 0 - 99 mg/dL 75 80 546(E) 99 -   HDL >39.00 mg/dL 70.35 00.93(G)  18.29 93.71 -   Trlycerides 0.0 - 149.0 mg/dL 69.6 789.0(H) 137.0 96.0 -   Hemoglobin A1c 4.0 - 6.0 % - - - - 5.7   HCO3 21.0 - 28.0 mEq/L - - - - 28.5(H)   O2SAT % - - - - 78.6       ADL UCSD:     Pulmonary Assessment Scores    Row Name 05/14/16 1037         ADL UCSD   ADL Phase Entry     SOB Score total 81     Rest 0     Walk 3     Stairs 5     Bath 4     Dress 3     Shop 5        Pulmonary Function Assessment:     Pulmonary Function Assessment - 05/14/16 1035      Pulmonary Function Tests   RV% 67 %   DLCO% 60 %     Initial Spirometry Results   FVC% 75 %   FEV1% 84 %   FEV1/FVC Ratio 84   Comments Test results 05/09/2015     Post Bronchodilator Spirometry Results   FVC% 75 %   FEV1% 87 %   FEV1/FVC Ratio 87     Breath   Bilateral Breath Sounds Clear   Shortness of Breath Yes;Limiting activity;Fear of Shortness of Breath      Exercise Target Goals:    Exercise Program Goal: Individual exercise prescription set with THRR, safety & activity barriers. Participant demonstrates ability to understand and report RPE using BORG scale, to self-measure pulse accurately, and to acknowledge the importance of the exercise prescription.  Exercise Prescription Goal: Starting with aerobic activity 30 plus minutes a day, 3 days per week for initial exercise prescription. Provide home exercise prescription and guidelines that participant acknowledges understanding prior to discharge.  Activity Barriers & Risk Stratification:     Activity Barriers & Cardiac Risk Stratification - 05/14/16 1034      Activity Barriers & Cardiac Risk Stratification   Activity Barriers Shortness of Breath;Deconditioning;Muscular Weakness   Cardiac Risk Stratification Moderate      6 Minute Walk:     6 Minute Walk    Row Name 05/14/16 1025         6 Minute Walk   Distance 253 feet     Walk Time 2.9 minutes     MPH 0.99     METS 1.8     RPE 15     Perceived Dyspnea  5      VO2 Peak 6.33     Resting HR 86 bpm     Resting BP 130/72     Max Ex.  HR 121 bpm     Max Ex. BP 128/70       Interval HR   Baseline HR 86     1 Minute HR 87     2 Minute HR 96     3 Minute HR 121     Interval Heart Rate? Yes       Interval Oxygen   Interval Oxygen? Yes     Baseline Oxygen Saturation % 97 %     Baseline Liters of Oxygen 10 L     1 Minute Oxygen Saturation % 97 %     1 Minute Liters of Oxygen 10 L     2 Minute Oxygen Saturation % 92 %     2 Minute Liters of Oxygen 10 L     3 Minute Oxygen Saturation % 86 %     3 Minute Liters of Oxygen 10 L        Initial Exercise Prescription:     Initial Exercise Prescription - 05/14/16 1000      Date of Initial Exercise RX and Referring Provider   Date 05/14/16   Referring Provider Mungal     Oxygen   Oxygen Continuous   Liters 10     Treadmill   MPH 0.8   Grade 0   Minutes 15  2/2/2/2 with rest     NuStep   Level 1   Minutes 15   METs 15  5/5/5     Biostep-RELP   Level 1   Minutes 15  5/5/5     Prescription Details   Frequency (times per week) 3   Duration Progress to 30 minutes of continuous aerobic without signs/symptoms of physical distress     Intensity   THRR 40-80% of Max Heartrate 113-141   Ratings of Perceived Exertion 11-13   Perceived Dyspnea 0-4     Progression   Progression Continue to progress workloads to maintain intensity without signs/symptoms of physical distress.     Resistance Training   Training Prescription Yes   Weight 1   Reps 10-15      Perform Capillary Blood Glucose checks as needed.  Exercise Prescription Changes:   Exercise Comments:     Exercise Comments    Row Name 05/14/16 1027           Exercise Comments O2 sat at 80 immdiately post walk - came up to 99% with PLB          Discharge Exercise Prescription (Final Exercise Prescription Changes):    Nutrition:  Target Goals: Understanding of nutrition guidelines, daily intake of sodium  1500mg , cholesterol 200mg , calories 30% from fat and 7% or less from saturated fats, daily to have 5 or more servings of fruits and vegetables.  Biometrics:     Pre Biometrics - 05/14/16 1024      Pre Biometrics   Height 5' 11.5" (1.816 m)   Weight 176 lb 6.4 oz (80 kg)   Waist Circumference 39.75 inches   Hip Circumference 41 inches   Waist to Hip Ratio 0.97 %   BMI (Calculated) 24.3       Nutrition Therapy Plan and Nutrition Goals:   Nutrition Discharge: Rate Your Plate Scores:   Psychosocial: Target Goals: Acknowledge presence or absence of depression, maximize coping skills, provide positive support system. Participant is able to verbalize types and ability to use techniques and skills needed for reducing stress and depression.  Initial Review & Psychosocial Screening:     Initial  Psych Review & Screening - 05/14/16 1049      Family Dynamics   Good Support System? Yes   Comments Mr Idolina PrimerUnderwood has good support from his wife. She is retiring next week and will be able to be with him fulltime through the lung transplant process. He state he does have depression with all the medical issues of his pulmonary fibrosis and facing the lung transplant surgery. He is very hopeful about pulmonary rehab and being more activie.     Barriers   Psychosocial barriers to participate in program The patient should benefit from training in stress management and relaxation.     Screening Interventions   Interventions Encouraged to exercise;Program counselor consult      Quality of Life Scores:     Quality of Life - 05/14/16 1053      Quality of Life Scores   Health/Function Pre 20.63 %   Socioeconomic Pre 19.75 %   Psych/Spiritual Pre 20.29 %   Family Pre 21 %   GLOBAL Pre 20.42 %      PHQ-9: Recent Review Flowsheet Data    [facility]   Decreased Interest 0   Down, Depressed, Hopeless 0   PHQ - 2 Score 0   Altered sleeping 0   Tired, decreased  energy 2   Change in appetite 0   Feeling bad or failure about yourself  0   Trouble concentrating 0   Moving slowly or fidgety/restless 2   Suicidal thoughts 0   PHQ-9 Score 4   Difficult doing work/chores Extremely dIfficult      Psychosocial Evaluation and Intervention:   Psychosocial Re-Evaluation:  Education: Education Goals: Education classes will be provided on a weekly basis, covering required topics. Participant will state understanding/return demonstration of topics presented.  Learning Barriers/Preferences:     Learning Barriers/Preferences - 05/14/16 1035      Learning Barriers/Preferences   Learning Barriers None   Learning Preferences Group Instruction;Individual Instruction;Pictoral;Skilled Demonstration;Verbal Instruction;Video;Written Material      Education Topics: Initial Evaluation Education: - Verbal, written and demonstration of respiratory meds, RPE/PD scales, oximetry and breathing techniques. Instruction on use of nebulizers and MDIs: cleaning and proper use, rinsing mouth with steroid doses and importance of monitoring MDI activations. Flowsheet [facility]  Date  05/14/16  Educator  LB  Instruction Review Code  2- meets goals/outcomes      General Nutrition Guidelines/Fats and Fiber: -Group instruction provided by verbal, written material, models and posters to present the general guidelines for heart healthy nutrition. Gives an explanation and review of dietary fats and fiber.   Controlling Sodium/Reading Food Labels: -Group verbal and written material supporting the discussion of sodium use in heart healthy nutrition. Review and explanation with models, verbal and written materials for utilization of the food label.   Exercise Physiology & Risk Factors: - Group verbal and written instruction with models to review the exercise physiology of the cardiovascular system and associated  critical values. Details cardiovascular disease risk factors and the goals associated with each risk factor.   Aerobic Exercise & Resistance Training: - Gives group verbal and written discussion on the health impact of inactivity. On the components of aerobic and resistive training programs and the benefits of this training and how to safely progress through these programs.   Flexibility, Balance, General Exercise Guidelines: - Provides group verbal and written instruction on the benefits of flexibility and balance training programs. Provides  general exercise guidelines with specific guidelines to those with heart or lung disease. Demonstration and skill practice provided.   Stress Management: - Provides group verbal and written instruction about the health risks of elevated stress, cause of high stress, and healthy ways to reduce stress.   Depression: - Provides group verbal and written instruction on the correlation between heart/lung disease and depressed mood, treatment options, and the stigmas associated with seeking treatment.   Exercise & Equipment Safety: - Individual verbal instruction and demonstration of equipment use and safety with use of the equipment.   Infection Prevention: - Provides verbal and written material to individual with discussion of infection control including proper hand washing and proper equipment cleaning during exercise session.   Falls Prevention: - Provides verbal and written material to individual with discussion of falls prevention and safety.   Diabetes: - Individual verbal and written instruction to review signs/symptoms of diabetes, desired ranges of glucose level fasting, after meals and with exercise. Advice that pre and post exercise glucose checks will be done for 3 sessions at entry of program.   Chronic Lung Diseases: - Group verbal and written instruction to review new updates, new respiratory medications, new advancements in  procedures and treatments. Provide informative websites and "800" numbers of self-education.   Lung Procedures: - Group verbal and written instruction to describe testing methods done to diagnose lung disease. Review the outcome of test results. Describe the treatment choices: Pulmonary Function Tests, ABGs and oximetry.   Energy Conservation: - Provide group verbal and written instruction for methods to conserve energy, plan and organize activities. Instruct on pacing techniques, use of adaptive equipment and posture/positioning to relieve shortness of breath.   Triggers: - Group verbal and written instruction to review types of environmental controls: home humidity, furnaces, filters, dust mite/pet prevention, HEPA vacuums. To discuss weather changes, air quality and the benefits of nasal washing.   Exacerbations: - Group verbal and written instruction to provide: warning signs, infection symptoms, calling MD promptly, preventive modes, and value of vaccinations. Review: effective airway clearance, coughing and/or vibration techniques. Create an Sport and exercise psychologist.   Oxygen: - Individual and group verbal and written instruction on oxygen therapy. Includes supplement oxygen, available portable oxygen systems, continuous and intermittent flow rates, oxygen safety, concentrators, and Medicare reimbursement for oxygen. Flowsheet Row Pulmonary Rehab from 05/14/2016 in [facility] Cardiac and Pulmonary Rehab  Date  05/14/16  Educator  LB  Instruction Review Code  2- meets goals/outcomes      Respiratory Medications: - Group verbal and written instruction to review medications for lung disease. Drug class, frequency, complications, importance of spacers, rinsing mouth after steroid MDI's, and proper cleaning methods for nebulizers. Flowsheet Row Pulmonary Rehab from 05/14/2016 in [facility] Cardiac and Pulmonary Rehab  Date  05/14/16  Educator  LB  Instruction Review Code  2- meets goals/outcomes       AED/CPR: - Group verbal and written instruction with the use of models to demonstrate the basic use of the AED with the basic ABC's of resuscitation.   Breathing Retraining: - Provides individuals verbal and written instruction on purpose, frequency, and proper technique of diaphragmatic breathing and pursed-lipped breathing. Applies individual practice skills. Flowsheet Row Pulmonary Rehab from 05/14/2016 in [facility] Cardiac and Pulmonary Rehab  Date  05/14/16  Educator  LB  Instruction Review Code  2- meets goals/outcomes      Anatomy and Physiology of the Lungs: - Group verbal and written instruction with the use of models to provide basic  lung anatomy and physiology related to function, structure and complications of lung disease.   Heart Failure: - Group verbal and written instruction on the basics of heart failure: signs/symptoms, treatments, explanation of ejection fraction, enlarged heart and cardiomyopathy.   Sleep Apnea: - Individual verbal and written instruction to review Obstructive Sleep Apnea. Review of risk factors, methods for diagnosing and types of masks and machines for OSA.   Anxiety: - Provides group, verbal and written instruction on the correlation between heart/lung disease and anxiety, treatment options, and management of anxiety.   Relaxation: - Provides group, verbal and written instruction about the benefits of relaxation for patients with heart/lung disease. Also provides patients with examples of relaxation techniques.   Knowledge Questionnaire Score:     Knowledge Questionnaire Score - 05/14/16 1035      Knowledge Questionnaire Score   Pre Score 9/10       Core Components/Risk Factors/Patient Goals at Admission:     Personal Goals and Risk Factors at Admission - 05/14/16 1101      Core Components/Risk Factors/Patient Goals on Admission   Increase knowledge of respiratory medications and ability to use respiratory devices properly  --   Wears 2l/m oxygen with portable gas tank; During , turned oxygen to 10l/m and dropped to 86% at end of his walk; Uses Esbriet, Symbicort, office sample of Bevespi, and has a SVN      Core Components/Risk Factors/Patient Goals Review:    Core Components/Risk Factors/Patient Goals at Discharge (Final Review):    ITP Comments:     ITP Comments    Row Name 05/22/16 0905           ITP Comments Mr Pepitone was planning to start [facility] today, but his wife stopped by the office and informed us that he was in the hospital. Ms Rochin would keep [place] updated with his progress.          Comments: Patient deceased.

## 2016-05-29 ENCOUNTER — Ambulatory Visit: Payer: PPO

## 2016-05-31 ENCOUNTER — Ambulatory Visit: Payer: PPO

## 2016-06-03 ENCOUNTER — Ambulatory Visit: Payer: PPO

## 2016-06-05 ENCOUNTER — Ambulatory Visit: Payer: PPO

## 2016-06-07 ENCOUNTER — Ambulatory Visit: Payer: PPO

## 2016-06-10 ENCOUNTER — Ambulatory Visit: Payer: PPO

## 2016-06-12 ENCOUNTER — Ambulatory Visit: Payer: PPO

## 2016-06-14 ENCOUNTER — Ambulatory Visit: Payer: PPO

## 2016-06-17 ENCOUNTER — Ambulatory Visit: Payer: PPO

## 2016-06-19 ENCOUNTER — Ambulatory Visit: Payer: PPO

## 2016-06-21 ENCOUNTER — Ambulatory Visit: Payer: PPO

## 2016-06-23 NOTE — Progress Notes (Signed)
Spoke with Dr. Sung AmabileSimonds via telephone concerning patients continued respiratory distress and coughing after full dose of 100mcg - fentanyl IVP. Dr. Sung AmabileSimonds in procedure at that time - took verbal orders to give stat 100 mcg - fentanyl IVP and begin a fentanyl drip at 1300mcg/hr.  Francia GreavesSavannah R Keilon Ressel, RN

## 2016-06-23 NOTE — Progress Notes (Signed)
Patient ID: Bradley Carpenter., male   DOB: 11/06/1950, 65 y.o.   MRN: [id number]  [employer] PROGRESS NOTE  Bradley Peon. WUJ:[id number] DOB: 12/26/1950 DOA: 05/02/2016 PCP: [name], MD  HPI/Subjective: Patient on fentanyl drip.  Periods of apnea.  Objective: Vitals:   May 25, 2016 1025 May 25, 2016 1030  BP: 99/73   Pulse: (!) 107 (!) 103  Resp: (!) 21 20  Temp:      Filed Weights   04/30/2016 1109 05/14/2016 1558 05/21/16 1347  Weight: 81.6 kg (180 lb) 75.3 kg (166 lb 1.6 oz) 73.5 kg (162 lb)    ROS: Review of Systems  Unable to perform ROS: Acuity of condition   Exam: Physical Exam  Constitutional: He appears lethargic.  HENT:  Nose: Mucosal edema present.  thrush  Eyes: Lids are normal.  Neck: Carotid bruit is not present.  Respiratory: Apnea noted. He has decreased breath sounds in the right middle field, the right lower field, the left middle field and the left lower field. He has rhonchi in the right lower field and the left lower field.  GI: Soft. There is no tenderness.  Musculoskeletal:       Right ankle: He exhibits no swelling.       Left ankle: He exhibits no swelling.  Lymphadenopathy:    He has no cervical adenopathy.  Neurological: He appears lethargic.  Skin: Skin is warm.  Psychiatric:  unresposnvie      Data Reviewed: Basic Metabolic Panel:  Recent Labs Lab 05/12/2016 1112 05/21/16 0310 05/22/16 0559 05/23/16 0434  NA 129* 125* 127* 129*  K 4.2 4.3 4.6 4.9  CL 91* 88* 90* 93*  CO2 30 24 29 28   GLUCOSE 126* 125* 130* 118*  BUN 10 14 21* 26*  CREATININE 0.89 0.79 0.86 0.84  CALCIUM 9.3 9.2 9.0 9.2   CBC:  Recent Labs Lab 05/10/2016 1112 05/21/16 0310  WBC 15.7* 10.0  HGB 14.8 14.1  HCT 43.8 40.5  MCV 93.1 92.4  PLT 237 228   Cardiac Enzymes:  Recent Labs Lab 04/28/2016 1112  TROPONINI <0.03   BNP (last 3 results)  Recent Labs  04/28/16 0923 05/19/2016 1112  BNP 180.0* 101.0*    CBG:  Recent Labs Lab  05/23/16 1358  GLUCAP 129*    Recent Results (from the past 240 hour(s))  Culture, group A strep     Status: None (Preliminary result)   Collection Time: 05/23/16 12:40 PM  Result Value Ref Range Status   Specimen Description THROAT  Final   Special Requests NONE  Final   Culture   Final    CULTURE REINCUBATED FOR BETTER GROWTH Performed at [facility]    Report Status PENDING  Incomplete     Studies: Ct Soft Tissue Neck W Contrast  Addendum Date: 05/23/2016   ADDENDUM REPORT: 05/23/2016 14:18 ADDENDUM: Critical Value/emergent results were called by by the Radiologist Assistant to Dr. Billy Fischer , at 1352 hours on 05/23/2016. who verbally acknowledged these results. Electronically Signed   By: Odessa Fleming M.D.   On: 05/23/2016 14:18   Result Date: 05/23/2016 CLINICAL DATA:  65 year old male with increased cough and shortness of breath for 2 days. Head and neck swelling. Pulmonary fibrosis. EXAM: CT NECK WITH CONTRAST TECHNIQUE: Multidetector CT imaging of the neck was performed using the standard protocol following the bolus administration of intravenous contrast. CONTRAST:  75mL ISOVUE-300 IOPAMIDOL (ISOVUE-300) INJECTION 61% in conjunction with contrast enhanced imaging of the chest reported separately.  COMPARISON:  [facility] neck CT 12/07/2012. Chest CT today reported separately. FINDINGS: Large volume diffuse subcutaneous emphysema tracking throughout the bilateral deep soft tissue spaces of the upper chest and neck. See chest findings today reported separately. Pharynx and larynx: Laryngeal and pharyngeal soft tissue contours are within normal limits. Diffuse gas throughout the bilateral parapharyngeal and retropharyngeal spaces. Salivary glands: Negative sublingual space. Gas throughout the bilateral submandibular spaces. Diminutive common negative submandibular glands. Gas throughout both parotid spaces. Negative parotid glands. Thyroid: Negative. Gas surrounding the  thyroid at the thoracic inlet. Lymph nodes: No cervical lymphadenopathy. Vascular: Gas throughout both carotid spaces. Suboptimal intravascular contrast. Grossly patent major vascular structures at the skullbase. Limited intracranial: Negative. Visualized orbits: Negative. Mastoids and visualized paranasal sinuses: Visualized paranasal sinuses and mastoids are stable and well pneumatized. Skeleton:  No acute osseous abnormality identified. Upper chest: Reported separately today. IMPRESSION: 1. Positive for Bilateral Pneumothorax with large volume subcutaneous emphysema tracking diffusely in the upper chest and neck. See Chest CT today reported separately. 2. No other acute findings in the neck. Electronically Signed: By: Odessa Fleming M.D. On: 05/23/2016 13:43   Ct Chest W Contrast  Result Date: 05/23/2016 CLINICAL DATA:  Increased shortness of breath and cough for 2 days, advanced pulmonary fibrosis, former smoker, coronary artery disease EXAM: CT CHEST WITH CONTRAST TECHNIQUE: Multidetector CT imaging of the chest was performed during intravenous contrast administration. Sagittal and coronal MPR images reconstructed from axial data set. CONTRAST:  75mL ISOVUE-300 IOPAMIDOL (ISOVUE-300) INJECTION 61% IV COMPARISON:  12/07/2012 CT chest, chest radiograph 05/09/2016 bold FINDINGS: Cardiovascular: Atherosclerotic calcifications aorta and coronary arteries. Thoracic vascular structures grossly patent on nondedicated exam. Ascending thoracic aorta normal caliber. Minimal pneumopericardium. Mediastinum/Nodes: Extensive pneumomediastinum extending into the cervical region bilaterally. Extensive chest wall and subcutaneous emphysema bilaterally. Minimally enlarged RIGHT paratracheal node 13 mm short axis image 22. Upper normal size AP window node 10 mm short axis image 23. Trachea appears patent. Lungs/Pleura: BILATERAL pneumothoraces small on LEFT and moderate to large on RIGHT. No midline shift. Underlying pulmonary  fibrosis. Superimposed atelectasis or less likely infiltrate seen in partially collapsed RIGHT upper lobe. No obvious pulmonary mass within limitations of exam. No pleural effusion. Upper Abdomen: Multiple LEFT renal cysts up to 6.8 cm greatest diameter image 70. Remaining visualized upper abdomen grossly unremarkable. No definite free intraperitoneal air at small visualized portion of upper abdomen. Musculoskeletal: No acute osseous findings. IMPRESSION: Extensive pulmonary fibrosis changes again identified with superimposed atelectasis or less likely infiltrate in RIGHT upper lobe. BILATERAL pneumothoraces RIGHT great than LEFT without midline shift. Extensive pneumomediastinumextending into cervical region associated with minimal pneumopericardium and chest wall/subcutaneous emphysema bilaterally. LEFT renal cysts. Aortic atherosclerosis with coronary arterial atherosclerotic calcification as well. Critical Value/emergent results were called by telephone at the time of interpretation on 05/23/2016 at 1357 hr to Dr. Billy Fischer , who verbally acknowledged these results. Electronically Signed   By: Ulyses Southward M.D.   On: 05/23/2016 13:58    Scheduled Meds: . fentaNYL  75 mcg Transdermal Q72H  . sodium chloride flush  3 mL Intravenous Q12H   Continuous Infusions: . fentaNYL infusion INTRAVENOUS 100 mcg/hr (06/22/16 1158)    Assessment/Plan:  1. Idiopathic pulmonary fibrosis exacerbation, New pneumothorax and subcutaneous emphysema. The patient also has chronic respiratory failure. The patient's idiopathic pulmonary fibrosis has progressed quickly. Family and patient decided On comfort care measures at this point. Fentanyl infusion ongoing. Comfort care measures only. 2. Bilateral facial and neck swelling likely secondary to subcutaneous  emphysema 3. History of CAD 4. History of hypertension 5. History of depression 6. Hyponatremia  Code Status:     Code Status Orders        Start      Ordered   05/23/16 1515  Do not attempt resuscitation (DNR)  Continuous    Question Answer Comment  In the event of cardiac or respiratory ARREST Do not call a "code blue"   In the event of cardiac or respiratory ARREST Do not perform Intubation, CPR, defibrillation or ACLS   In the event of cardiac or respiratory ARREST Use medication by any route, position, wound care, and other measures to relive pain and suffering. May use oxygen, suction and manual treatment of airway obstruction as needed for comfort.      05/23/16 1518    Code Status History    Date Active Date Inactive Code Status Order ID Comments User Context   05/23/2016  4:55 PM 05/23/2016  3:18 PM Partial Code [id number]  Altamese DillingVaibhavkumar Vachhani, MD Inpatient   04/28/2016 12:28 PM 04/29/2016  5:18 PM DNR [id number]  Gracelyn NurseJohn D Johnston, MD Inpatient   03/24/2016  1:06 PM 03/26/2016  4:11 PM Full Code [id number]  Katharina Caperima Vaickute, MD Inpatient     Family Communication: Family at the bedside Disposition Plan: Patient will not survive much longer  Consultants:  Critical care specialist  Time spent: 25 minutes  Bradley May, Bradley May  [employer]

## 2016-06-23 NOTE — Progress Notes (Signed)
RT to room to placed patient on 6LPM [place] per verbal order from Dr. Sung AmabileSimonds.  Patient removed from [facility] after family informed of Dr. Sung AmabileSimonds order.  Patient is CMO to remain on [place] per Dr. Sung AmabileSimonds

## 2016-06-23 NOTE — Progress Notes (Signed)
He is very comfortable after fentanyl. I did not awaken. I spoke with his daughter and she voiced no concerns or issues. Continue full comfort care. As he is likely to pass in the next several hours, I have not placed transfer orders. Will change to SDU status  Bradley Fischeravid Climmie Cronce, MD PCCM service Mobile [phone] Pager [phone] 06/12/2016

## 2016-06-23 NOTE — Discharge Summary (Addendum)
DEATH SUMMARY  DATE OF ADMISSION:  05/19/2016  DATE OF DISCHARGE/DEATH:  September 11, 2016  ADMISSION DIAGNOSES:   Acute on chronic hypoxic respiratory failure Idiopathic pulmonary fibrosis Essential hypertension Anxiety Depression  DISCHARGE DIAGNOSES:   Acute on chronic hypoxic respiratory failure Idiopathic pulmonary fibrosis Essential hypertension Anxiety Depression Former smoker LE edema Intractable dyspnea Intractable cough Face and neck swelling Spontaneous pneumothorax Extensive subcutaneous emphysema   PRESENTATION:   Pt was admitted by the hospitalist service with the following HPI and the above admission diagnoses:  HISTORY OF PRESENT ILLNESS: Bradley May  is a 65 y.o. male with a known history of Coronary artery disease, benign prostatic hypertrophy, negative for the pulmonary fibrosis- and recurrent admission in hospital and [facility] for possible lung transplant. He continued to feel short of breath with minimal exertion and could not perform any activities at home. He also have coughing spells in between when he cannot catch his breath and his saturation drops up to 30-40% as per the wife. Concerned with this continued symptoms he came to emergency room today. He denies any sputum production, fever, chills. As he is resting with oxygen his saturation is stable.   HOSPITAL COURSE:   He was admitted to a general medical floor under the care of the hospitalist service. He was seen in consultation on the day of admission. He was treated with intravenous steroids and supplemental oxygen. He received a dose of furosemide with improvement in LE edema. He was started on low dose fentanyl patch and intermittent morphine for dyspnea and cough. He exhibited mild improvement over the first couple of days of hospitalization but still required high flow  O2. On the morning of 05/23/16 he reported vigorous coughing paroxysms the night before and throat pain. He was  noted to have swelling of the neck and face. An urgent CT neck and chest was ordered and revealed bilateral R>L pneumothoraces, pneumomediastinum and extensive subcutaneous emphysema.   He was transferred to the ICU in anticipation of chest tube placement. I discussed the procedure with the pt and his wife. We considered the potential benefit of resolving the pneumothoraces and what we could realistically hope for if they were to resolve. I was clear that I thought lung transplantation was no longer an option as it was highly unlikely that he would be considered a candidate even prior to these developments. With that the case, I suggested that what we could "fight for" would be a few weeks at home with Hospice and hopefully some meaningful time with his loved ones. He asked what the course of this would be if we did not place chest tubes(s). I explained that the pneumothoraces would likely progress and be the cause of his death. I offered a time frame of days, not weeks.  He responded that he was very tired and wished to discuss this with his wife. We all spoke together and ultimately with his daughter as well. They all agreed that a palliative course and forgoing chest tube placement was the most appropriate option. From that point on, he was provided comfort care with the family by his side. He passed away peacefully in the afternoon of September 11, 2016.   Cause of death:  Idiopathic pulmonary fibrosis complicated by bilateral spontaneous pneumothorax  Contributing factors:   Autopsy: No  Smoking: No   Billy Fischeravid Simonds, MD PCCM service Mobile [phone] Pager [phone] 05/28/2016

## 2016-06-23 NOTE — Progress Notes (Signed)
At 1504, patient asystole per cardiac monitor and heart sounds auscultated by 2 RN's and none present.   Called time of death - Oct 04, 2015 at 1504 with family and friends at the bedside. Chaplain visited family following.  Nursing supervisor and attending - Dr. Renae GlossWieting notified of patient time of death.   Francia GreavesSavannah R Brylon Brenning, RN

## 2016-06-23 NOTE — Care Management (Signed)
Patient declining per nurse. RNCM consult cancelled for hospice screen. List of hospice agencies left with his nurse in case it is needed. HFO2 is difficult to support in outpatient setting including hospice.  RNCM will continue to follow.

## 2016-06-23 NOTE — Progress Notes (Signed)
Nutrition Brief Note  Chart reviewed. Pt now transitioning to comfort care.  No further nutrition interventions warranted at this time. RD will sign off.  Please re-consult as needed.    Swannie Milius MS, RD, LDN [phone] Pager  [phone] Weekend/On-Call Pager    

## 2016-06-23 DEATH — deceased

## 2016-06-24 ENCOUNTER — Ambulatory Visit: Payer: PPO

## 2016-06-24 ENCOUNTER — Ambulatory Visit: Payer: PPO | Admitting: Internal Medicine

## 2016-06-26 ENCOUNTER — Ambulatory Visit: Payer: PPO

## 2016-06-28 ENCOUNTER — Ambulatory Visit: Payer: PPO

## 2016-07-01 ENCOUNTER — Ambulatory Visit: Payer: PPO

## 2016-07-03 ENCOUNTER — Ambulatory Visit: Payer: PPO

## 2016-07-05 ENCOUNTER — Ambulatory Visit: Payer: PPO

## 2016-07-08 ENCOUNTER — Ambulatory Visit: Payer: PPO

## 2016-07-10 ENCOUNTER — Ambulatory Visit: Payer: PPO

## 2016-07-12 ENCOUNTER — Ambulatory Visit: Payer: PPO

## 2016-07-15 ENCOUNTER — Ambulatory Visit: Payer: PPO

## 2016-07-17 ENCOUNTER — Ambulatory Visit: Payer: PPO

## 2016-07-19 ENCOUNTER — Ambulatory Visit: Payer: PPO

## 2016-07-22 ENCOUNTER — Ambulatory Visit: Payer: PPO

## 2016-07-24 ENCOUNTER — Ambulatory Visit: Payer: PPO

## 2016-07-26 ENCOUNTER — Ambulatory Visit: Payer: PPO

## 2016-07-29 ENCOUNTER — Ambulatory Visit: Payer: PPO

## 2016-07-31 ENCOUNTER — Ambulatory Visit: Payer: PPO

## 2016-08-02 ENCOUNTER — Ambulatory Visit: Payer: PPO

## 2016-08-05 ENCOUNTER — Ambulatory Visit: Payer: PPO

## 2016-08-07 ENCOUNTER — Ambulatory Visit: Payer: PPO

## 2016-08-09 ENCOUNTER — Ambulatory Visit: Payer: PPO

## 2016-08-12 ENCOUNTER — Ambulatory Visit: Payer: PPO

## 2016-08-14 ENCOUNTER — Ambulatory Visit: Payer: PPO

## 2017-03-05 ENCOUNTER — Encounter: Payer: 59 | Admitting: Internal Medicine
